# Patient Record
Sex: Female | Born: 1955 | Race: White | Hispanic: No
Health system: Midwestern US, Community
[De-identification: ages and names within clinical notes are randomized; demographics above are authoritative.]

## PROBLEM LIST (undated history)

## (undated) DIAGNOSIS — R079 Chest pain, unspecified: Secondary | ICD-10-CM

## (undated) DIAGNOSIS — R1032 Left lower quadrant pain: Secondary | ICD-10-CM

## (undated) DIAGNOSIS — R7989 Other specified abnormal findings of blood chemistry: Secondary | ICD-10-CM

## (undated) DIAGNOSIS — M5136 Other intervertebral disc degeneration, lumbar region: Secondary | ICD-10-CM

## (undated) DIAGNOSIS — M25552 Pain in left hip: Secondary | ICD-10-CM

## (undated) DIAGNOSIS — R3121 Asymptomatic microscopic hematuria: Secondary | ICD-10-CM

## (undated) DIAGNOSIS — Z1231 Encounter for screening mammogram for malignant neoplasm of breast: Secondary | ICD-10-CM

## (undated) DIAGNOSIS — E1142 Type 2 diabetes mellitus with diabetic polyneuropathy: Secondary | ICD-10-CM

## (undated) DIAGNOSIS — R1030 Lower abdominal pain, unspecified: Secondary | ICD-10-CM

## (undated) DIAGNOSIS — Z1239 Encounter for other screening for malignant neoplasm of breast: Secondary | ICD-10-CM

## (undated) DIAGNOSIS — R198 Other specified symptoms and signs involving the digestive system and abdomen: Secondary | ICD-10-CM

## (undated) DIAGNOSIS — M25559 Pain in unspecified hip: Secondary | ICD-10-CM

## (undated) DIAGNOSIS — F068 Other specified mental disorders due to known physiological condition: Secondary | ICD-10-CM

## (undated) DIAGNOSIS — R222 Localized swelling, mass and lump, trunk: Secondary | ICD-10-CM

## (undated) DIAGNOSIS — B3781 Candidal esophagitis: Secondary | ICD-10-CM

## (undated) DIAGNOSIS — R600 Localized edema: Secondary | ICD-10-CM

## (undated) DIAGNOSIS — D171 Benign lipomatous neoplasm of skin and subcutaneous tissue of trunk: Secondary | ICD-10-CM

## (undated) DIAGNOSIS — M329 Systemic lupus erythematosus, unspecified: Secondary | ICD-10-CM

## (undated) DIAGNOSIS — M6281 Muscle weakness (generalized): Secondary | ICD-10-CM

## (undated) DIAGNOSIS — R109 Unspecified abdominal pain: Secondary | ICD-10-CM

## (undated) DIAGNOSIS — M810 Age-related osteoporosis without current pathological fracture: Secondary | ICD-10-CM

## (undated) DIAGNOSIS — D125 Benign neoplasm of sigmoid colon: Secondary | ICD-10-CM

## (undated) DIAGNOSIS — R0602 Shortness of breath: Secondary | ICD-10-CM

## (undated) DIAGNOSIS — M2392 Unspecified internal derangement of left knee: Secondary | ICD-10-CM

## (undated) DIAGNOSIS — G250 Essential tremor: Secondary | ICD-10-CM

## (undated) DIAGNOSIS — M25519 Pain in unspecified shoulder: Secondary | ICD-10-CM

## (undated) DIAGNOSIS — R059 Cough, unspecified: Secondary | ICD-10-CM

## (undated) DIAGNOSIS — Z780 Asymptomatic menopausal state: Secondary | ICD-10-CM

## (undated) DIAGNOSIS — Z87891 Personal history of nicotine dependence: Secondary | ICD-10-CM

## (undated) DIAGNOSIS — M4722 Other spondylosis with radiculopathy, cervical region: Secondary | ICD-10-CM

## (undated) DIAGNOSIS — M79604 Pain in right leg: Secondary | ICD-10-CM

## (undated) DIAGNOSIS — G43909 Migraine, unspecified, not intractable, without status migrainosus: Secondary | ICD-10-CM

## (undated) DIAGNOSIS — R9389 Abnormal findings on diagnostic imaging of other specified body structures: Secondary | ICD-10-CM

## (undated) DIAGNOSIS — R42 Dizziness and giddiness: Secondary | ICD-10-CM

## (undated) DIAGNOSIS — G43009 Migraine without aura, not intractable, without status migrainosus: Secondary | ICD-10-CM

## (undated) DIAGNOSIS — M545 Low back pain, unspecified: Secondary | ICD-10-CM

## (undated) DIAGNOSIS — H8109 Meniere's disease, unspecified ear: Secondary | ICD-10-CM

## (undated) DIAGNOSIS — K529 Noninfective gastroenteritis and colitis, unspecified: Secondary | ICD-10-CM

## (undated) DIAGNOSIS — M51369 Other intervertebral disc degeneration, lumbar region without mention of lumbar back pain or lower extremity pain: Secondary | ICD-10-CM

## (undated) DIAGNOSIS — F1190 Opioid use, unspecified, uncomplicated: Secondary | ICD-10-CM

## (undated) DIAGNOSIS — M5412 Radiculopathy, cervical region: Principal | ICD-10-CM

## (undated) DIAGNOSIS — R634 Abnormal weight loss: Secondary | ICD-10-CM

## (undated) DIAGNOSIS — R55 Syncope and collapse: Secondary | ICD-10-CM

## (undated) DIAGNOSIS — M5416 Radiculopathy, lumbar region: Principal | ICD-10-CM

## (undated) DIAGNOSIS — M7989 Other specified soft tissue disorders: Secondary | ICD-10-CM

## (undated) DIAGNOSIS — M48062 Spinal stenosis, lumbar region with neurogenic claudication: Secondary | ICD-10-CM

## (undated) DIAGNOSIS — D485 Neoplasm of uncertain behavior of skin: Secondary | ICD-10-CM

## (undated) HISTORY — PX: BACK SURGERY: SHX140

## (undated) HISTORY — PX: COLON RESECTION: SHX5231

## (undated) HISTORY — PX: ABDOMINAL HYSTERECTOMY: SHX81

## (undated) HISTORY — PX: EYE SURGERY: SHX253

---

## 2008-10-07 LAB — METABOLIC PANEL, COMPREHENSIVE
A-G Ratio: 0.8 — ABNORMAL LOW (ref 1.1–2.2)
A-G Ratio: 0.9 — ABNORMAL LOW (ref 1.1–2.2)
ALT (SGPT): 106 U/L — ABNORMAL HIGH (ref 30–65)
ALT (SGPT): 160 U/L — ABNORMAL HIGH (ref 30–65)
AST (SGOT): 158 U/L — ABNORMAL HIGH (ref 15–37)
AST (SGOT): 99 U/L — ABNORMAL HIGH (ref 15–37)
Albumin: 2.6 g/dL — ABNORMAL LOW (ref 3.5–5.0)
Albumin: 3.6 g/dL (ref 3.5–5.0)
Alk. phosphatase: 114 U/L (ref 50–136)
Alk. phosphatase: 170 U/L — ABNORMAL HIGH (ref 50–136)
Anion gap: 6 mmol/L (ref 5–15)
Anion gap: 9 mmol/L (ref 5–15)
BUN/Creatinine ratio: 23 — ABNORMAL HIGH (ref 12–20)
BUN/Creatinine ratio: 27 — ABNORMAL HIGH (ref 12–20)
BUN: 18 MG/DL (ref 6–20)
BUN: 19 MG/DL (ref 6–20)
Bilirubin, total: 0.3 MG/DL (ref 0.2–1.0)
Bilirubin, total: 0.5 MG/DL (ref 0.2–1.0)
CO2: 27 MMOL/L (ref 21–32)
CO2: 27 MMOL/L (ref 21–32)
Calcium: 7.2 MG/DL — ABNORMAL LOW (ref 8.5–10.1)
Calcium: 8.6 MG/DL (ref 8.5–10.1)
Chloride: 103 MMOL/L (ref 97–108)
Chloride: 110 MMOL/L — ABNORMAL HIGH (ref 97–108)
Creatinine: 0.7 MG/DL (ref 0.6–1.3)
Creatinine: 0.8 MG/DL (ref 0.6–1.3)
GFR est AA: >60 mL/min/{1.73_m2} (ref >60)
GFR est AA: >60 mL/min/{1.73_m2} (ref >60)
GFR est non-AA: >60 mL/min/{1.73_m2} (ref >60)
GFR est non-AA: >60 mL/min/{1.73_m2} (ref >60)
Globulin: 3.1 g/dL (ref 2.0–4.0)
Globulin: 4.1 g/dL — ABNORMAL HIGH (ref 2.0–4.0)
Glucose: 120 MG/DL — ABNORMAL HIGH (ref 50–100)
Glucose: 85 MG/DL (ref 50–100)
Potassium: 3 MMOL/L — ABNORMAL LOW (ref 3.5–5.1)
Potassium: 3.1 MMOL/L — ABNORMAL LOW (ref 3.5–5.1)
Protein, total: 5.7 g/dL — ABNORMAL LOW (ref 6.4–8.2)
Protein, total: 7.7 g/dL (ref 6.4–8.2)
Sodium: 139 MMOL/L (ref 136–145)
Sodium: 143 MMOL/L (ref 136–145)

## 2008-10-07 LAB — URINALYSIS W/ REFLEX CULTURE
Blood: NEGATIVE
Glucose: NEGATIVE MG/DL
Ketone: 15 MG/DL — AB
Leukocyte Esterase: NEGATIVE
Nitrites: NEGATIVE
Specific gravity: >1.03 — ABNORMAL HIGH (ref 1.003–1.030)
Urobilinogen: 1 EU/DL (ref 0.2–1.0)
pH (UA): 5.5 (ref 5.0–8.0)

## 2008-10-07 LAB — CBC WITH AUTOMATED DIFF
ABS. BASOPHILS: 0 10*3/uL (ref 0.0–0.1)
ABS. EOSINOPHILS: 0 10*3/uL (ref 0.0–0.4)
ABS. LYMPHOCYTES: 0.6 10*3/uL — ABNORMAL LOW (ref 0.8–3.5)
ABS. MONOCYTES: 0.2 10*3/uL (ref 0.0–1.0)
ABS. NEUTROPHILS: 6.7 10*3/uL (ref 1.8–8.0)
BASOPHILS: 0 % (ref 0–1)
EOSINOPHILS: 0 % (ref 0–7)
HCT: 43.6 % (ref 35.0–47.0)
HGB: 15.4 g/dL (ref 11.5–16.0)
LYMPHOCYTES: 8 % — ABNORMAL LOW (ref 12–49)
MCH: 31.8 PG (ref 26.0–34.0)
MCHC: 35.3 g/dL (ref 30.0–36.5)
MCV: 90.1 FL (ref 80.0–99.0)
MONOCYTES: 3 % — ABNORMAL LOW (ref 5–13)
NEUTROPHILS: 89 % — ABNORMAL HIGH (ref 32–75)
PLATELET: 268 10*3/uL (ref 150–400)
RBC: 4.84 M/uL (ref 3.80–5.20)
RDW: 13.1 % (ref 11.5–14.5)
WBC: 7.5 10*3/uL (ref 3.6–11.0)

## 2008-10-07 LAB — CBC W/O DIFF
HCT: 35.1 % (ref 35.0–47.0)
HGB: 11.9 g/dL (ref 11.5–16.0)
MCH: 31.2 PG (ref 26.0–34.0)
MCHC: 33.9 g/dL (ref 30.0–36.5)
MCV: 92.1 FL (ref 80.0–99.0)
PLATELET: 207 10*3/uL (ref 150–400)
RBC: 3.81 M/uL (ref 3.80–5.20)
RDW: 13.5 % (ref 11.5–14.5)
WBC: 3.5 10*3/uL — ABNORMAL LOW (ref 3.6–11.0)

## 2008-10-07 LAB — BILIRUBIN, CONFIRM: Bilirubin UA, confirm: NEGATIVE

## 2008-10-07 LAB — HEP B SURFACE AB
Hep B surface Ab Interp.: NEGATIVE
Hepatitis B surface Ab: 3.21 m[IU]/mL

## 2008-10-07 LAB — WBC, STOOL: White blood cells, stool: 0 /HPF (ref 0–4)

## 2008-10-07 LAB — LIPASE: Lipase: 167 U/L (ref 114–286)

## 2008-10-07 LAB — AMYLASE: Amylase: 28 U/L (ref 25–115)

## 2008-10-07 LAB — C DIFFICILE TOXIN A & B BY EIA: C. difficile toxin A&B: NEGATIVE

## 2008-10-07 LAB — MAGNESIUM: Magnesium: 1.5 MG/DL — ABNORMAL LOW (ref 1.6–2.4)

## 2008-10-07 LAB — HEPATITIS B SURFACE ANTIBODY
HEP B SURF AB: 3.21 m[IU]/mL
Hep B S Ab: NEGATIVE

## 2008-10-07 NOTE — H&P (Signed)
Name: GUDRUN, AXE Admitted: 10/07/2008  MR #: [id number] DOB: 1956-03-28  Account #: [id number] Age: 53  Physician: Raniah Karan K. Havery Moros, M.D. Location: [id number]     HISTORY PHYSICAL      PRIMARY CARE PHYSICIAN: Andy Gauss, NP    REFERRING PHYSICIAN: Ezequiel Essex, MD    PRESENTING COMPLAINT: Nausea, vomiting, and diarrhea for 2 days.    HISTORY OF PRESENTING COMPLAINT: A 53 year old Caucasian female with a  past medical history of fibromyalgia. She comes in with a complaint of  nausea which started about 48 hours before presentation to the ER. She had  nausea, which progressively turned into vomiting. She had several bouts of  vomiting, then she developed diarrhea. She has had multiple bouts of  diarrhea. This is now associated with colicky abdominal pain and she has  pain in her left lower quadrant. She reports that there has been a stomach  bug which has been going through her grandchildren. For the last 3 weeks,  at least one member of the family has been sick every week. She had  several grandchildren who were sick about 3 weeks ago, then her son was  sick last week, and this week about 4 days prior to this presentation, one  of her grandchildren also had diarrhea, nausea, and vomiting, and was  staying with her in the evenings after school. She lives with her husband  and her husband is not sick. She does not remember eating anything out of  the ordinary, and she feels very weak and tired.    PAST MEDICAL HISTORY:  1. Fibromyalgia.  2. Depression.  3. Herpes zoster which involved the right eye. 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 She is on chronic Valtrex therapy.  4. Chronic back pain with a history of 2 back surgeries in 1982 and 1991  for a ruptured disk.  5. Abscess of the colon, status post segmental colectomy in 1994, most  likely in the sigmoid colon.  6. History of cholecystectomy in 2001.   7. History of hysterectomy in 1994.  8. Arthroscopy of the right knee in January 2010.  9. Surgery on her left ear for M ni re disease in October 2009.    ALLERGIES: PENICILLIN and MORPHINE (for the morphine, the pain intensified  after she received it).    OUTPATIENT MEDICATIONS:  1. Hydrochlorothiazide 37.5 mg p.o. daily.  2. Cymbalta 60 mg b.i.d.  3. Zofran p.r.n., started today.  4. Hydrocodone APAP 5/500 mg 1 q.4h. p.r.n.  5. Valtrex 1 g p.o. daily.  6. Premarin 0.9 mg p.o. daily.  7. Flexeril p.r.n.    SOCIAL HISTORY: She is married. She lives with her husband. She has 2  children and 2 or 3 step children. She is currently on Disability. She  used to smoke, but quit. She does not drink any alcohol.    FAMILY HISTORY: She has a mother who has bradycardia. She has a sister  who has an arrhythmia. That sister needed to be shocked at some point.  Both the mother and sister are on beta-blockers.    REVIEW OF SYSTEMS: CONSTITUTIONAL: No fever, chills, or rigors. No  weight loss. She does feel very tired. EYES: She has decreased vision in  her right eye after herpes zoster. 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 Her vision is now stable. Otherwise, no excessive  tearing, and no  redness of the eyes. EARS, NOSE, MOUTH, AND THROAT: She had surgery in  her left ear for M ni re disease in October 2009. She does not have any  ear pain. She does not have any ear discharge. She does not have  excessive sneezing or rhinorrhea. No oral sores. No sore throat.  CARDIOVASCULAR: No orthopnea. No paroxysmal nocturnal dyspnea. No  palpitations. skipped heart beats. No chest pain or chest pressure.  RESPIRATORY: No cough. No shortness of breath. No chest tightness. No  wheezing. GASTROINTESTINAL: Please refer to HPI. GENITOURINARY: No  dysuria or frequency. She may have noticed decreased urinary output. No  hematuria. MUSCULOSKELETAL: She has chronic pain, especially in the back   because of her fibromyalgia and also a history of ruptured disk.  NEUROLOGIC: She has a mild headache at this time. No changes in her  vision. She does not have any facial asymmetry. She does not have any  weakness or numbness of any part of her body. PSYCHIATRIC: She has a  history of depression, and she is on Cymbalta.    PHYSICAL EXAMINATION:  GENERAL CONDITION: A Caucasian female who appears to be uncomfortable.  Does not appear to be toxic.  VITAL SIGNS: Temperature is 98.8. Pulse was initially 132 and is now 98.  Respiratory rate was initially in the 20s, now 18. Blood pressure is  149/64. Pulse oximetry is 97% on room air.  HEENT: Pupils are equal, round, and reactive to light and accommodation.  Extraocular muscle movements are intact. Sclerae are nonicteric. She does  not have pallor of the conjunctivae. No flaring of the ala nasi. Oral  mucosa is moist with no oropharyngeal erythema. No oral sores. No  candidiasis.  NECK: Supple with no thyromegaly. Trachea is midline. No cervical  adenopathy.  CHEST: Chest excursion is symmetrical. She does not have any chest wall  tenderness. There is no use of accessory muscles of respiration. No  subcostal or intercostal retractions. Lungs are clear to auscultation with  no wheezing or rales.  CARDIOVASCULAR: S1, S2 heard and regular. No S3, no S4 appreciated.  ABDOMEN: Obese. No hepatomegaly. No splenomegaly. She has tenderness in  the left lower quadrant with no guarding or rebound. Bowel sounds are  present.  EXTREMITIES: She does not have any clubbing. No cyanosis, no pitting  edema.  INTEGUMENTARY: She does not have any rash. No subcutaneous nodules. No  ecchymosis.  CENTRAL NERVOUS SYSTEM: Alert and oriented x3. Cranial nerves 2-12 are  normal. Deep tendon reflexes are 2+ globally. Gait is not tested.  PSYCHIATRIC: Affect and mood are appropriate.    LABORATORIES: White cell count is 7.5, hemoglobin is 15.4, hematocrit is   43.6, and platelet count is 268. Neutrophils are 89%, lymphocytes are 8%.  Sodium is 139, potassium is 3.1, chloride is 103, CO2 is 27, BUN is 18,  creatinine is 0.8. AST is 158, ALT is 160, alkaline phosphatase is 170.  Albumin is 3.6. Amylase is 28, lipase is 167. Urinalysis: She has 5-10  epithelial cells, so this is a bad specimen.    ASSESSMENT AND PLAN:  1. Acute gastroenteritis in a patient who has been exposed to other people  who have had nausea, vomiting, and diarrhea. Most like oral or fecal route  of transmission, and most likely a viral illness. We shall admit the  patient and hydrate her aggressively. Will send stool studies.  2. Systemic inflammatory response with a pulse rate of 132 and respiratory  rate of 20. The patient's symptoms are improving. This was most likely  secondary to dehydration.  3. Abnormal liver function tests with transaminase elevation, as well as  alkaline phosphatase. The patient is obese. This could be due to fatty  liver, but because of the nausea and vomiting and acute onset, will exclude  hepatitis A infection. I will also do hepatitis B screening.  4. Fibromyalgia. Continue outpatient medications.  5. Hypertension. Will hold oral medications and give her hydralazine as  needed p.r.n.  6. History of herpes zoster of the trigeminal nerve. I am going to keep  the patient on Voltaren.  7. Deep venous thrombosis prophylaxis. The patient will get sequential  TEDs.  8. Code status. The patient is a FULL CODE.    This patient is admitted to the service of Dr. Pecolia Ades. About 65  minutes was spent in the care of this patient.            E-Signed By  Praxair. Havery Moros, M.D. 10/22/2008 06:07    Marvell Tamer K. Havery Moros, M.D.    cc: Marionette Meskill K. Havery Moros, M.D.        AKM/WMX; D: 10/07/2008 1:19 A; T: 10/07/2008 11:10 A; DOC# [id number]; Job#  [id number]

## 2008-10-08 LAB — CBC W/O DIFF
HCT: 35.6 % (ref 35.0–47.0)
HGB: 11.9 g/dL (ref 11.5–16.0)
MCH: 31.5 PG (ref 26.0–34.0)
MCHC: 33.4 g/dL (ref 30.0–36.5)
MCV: 94.2 FL (ref 80.0–99.0)
PLATELET: 173 10*3/uL (ref 150–400)
RBC: 3.78 M/uL — ABNORMAL LOW (ref 3.80–5.20)
RDW: 13.7 % (ref 11.5–14.5)
WBC: 4.1 10*3/uL (ref 3.6–11.0)

## 2008-10-08 LAB — MAGNESIUM: Magnesium: 1.8 MG/DL (ref 1.6–2.4)

## 2008-10-08 LAB — METABOLIC PANEL, BASIC
Anion gap: 7 mmol/L (ref 5–15)
BUN/Creatinine ratio: 6 — ABNORMAL LOW (ref 12–20)
BUN: 4 MG/DL — ABNORMAL LOW (ref 6–20)
CO2: 25 MMOL/L (ref 21–32)
Calcium: 7.5 MG/DL — ABNORMAL LOW (ref 8.5–10.1)
Chloride: 109 MMOL/L — ABNORMAL HIGH (ref 97–108)
Creatinine: 0.7 MG/DL (ref 0.6–1.3)
GFR est AA: >60 mL/min/{1.73_m2} (ref >60)
GFR est non-AA: >60 mL/min/{1.73_m2} (ref >60)
Glucose: 80 MG/DL (ref 50–100)
Potassium: 3.4 MMOL/L — ABNORMAL LOW (ref 3.5–5.1)
Sodium: 141 MMOL/L (ref 136–145)

## 2008-10-08 LAB — CULTURE, URINE
Colonies Counted: <1000
Colony Count: <1000
Culture result:: NO GROWTH
Culture: NO GROWTH

## 2008-10-08 LAB — CULTURE, STOOL

## 2008-10-08 LAB — HEPATIC FUNCTION PANEL
A-G Ratio: 0.8 — ABNORMAL LOW (ref 1.1–2.2)
ALT (SGPT): 85 U/L — ABNORMAL HIGH (ref 30–65)
AST (SGOT): 82 U/L — ABNORMAL HIGH (ref 15–37)
Albumin: 2.5 g/dL — ABNORMAL LOW (ref 3.5–5.0)
Alk. phosphatase: 113 U/L (ref 50–136)
Bilirubin, direct: 0.1 MG/DL (ref 0.0–0.2)
Bilirubin, total: 0.2 MG/DL (ref 0.2–1.0)
Globulin: 3.1 g/dL (ref 2.0–4.0)
Protein, total: 5.6 g/dL — ABNORMAL LOW (ref 6.4–8.2)

## 2008-10-08 LAB — HEP B SURFACE AG: Hep B surface Ag: NEGATIVE

## 2008-10-09 LAB — CBC W/O DIFF
HCT: 35 % (ref 35.0–47.0)
HGB: 11.6 g/dL (ref 11.5–16.0)
MCH: 30.6 PG (ref 26.0–34.0)
MCHC: 33.1 g/dL (ref 30.0–36.5)
MCV: 92.3 FL (ref 80.0–99.0)
PLATELET: 192 10*3/uL (ref 150–400)
RBC: 3.79 M/uL — ABNORMAL LOW (ref 3.80–5.20)
RDW: 13.4 % (ref 11.5–14.5)
WBC: 4.8 10*3/uL (ref 3.6–11.0)

## 2008-10-09 LAB — METABOLIC PANEL, BASIC
Anion gap: 6 mmol/L (ref 5–15)
BUN/Creatinine ratio: 6 — ABNORMAL LOW (ref 12–20)
BUN: 4 MG/DL — ABNORMAL LOW (ref 6–20)
CO2: 26 MMOL/L (ref 21–32)
Calcium: 7.8 MG/DL — ABNORMAL LOW (ref 8.5–10.1)
Chloride: 109 MMOL/L — ABNORMAL HIGH (ref 97–108)
Creatinine: 0.7 MG/DL (ref 0.6–1.3)
GFR est AA: >60 mL/min/{1.73_m2} (ref >60)
GFR est non-AA: >60 mL/min/{1.73_m2} (ref >60)
Glucose: 100 MG/DL (ref 50–100)
Potassium: 3.4 MMOL/L — ABNORMAL LOW (ref 3.5–5.1)
Sodium: 141 MMOL/L (ref 136–145)

## 2008-10-09 LAB — CMV AB, IGM: CMV Ab, IgM: 0.7 IV

## 2008-10-09 LAB — CMV AB, IGG: CMV Ab, IgG: 7.97 IV

## 2008-10-09 LAB — HEPATITIS A AB, IGM: HEPATITIS A Ab, IgM: NEGATIVE

## 2008-10-09 NOTE — Discharge Summary (Signed)
Name: Katherine [name], Katherine [name] Admitted: 10/07/2008  MR #: [id number] Discharged: 10/09/2008  Account #: [id number] DOB: 03/13/1956  Physician: Mittie Bodo A. Pecolia Ades, M.D. Age 53     DISCHARGE SUMMARY        PRIMARY CARE PHYSICIAN: Dr. Lamar Blinks.    CONSULTANT: Gastroenterology, Daralene Milch, MD.    DISCHARGE DIAGNOSES:  1. Suspected viral gastroenteritis.  2. Elevated liver function test secondary to the viral infection.  3. Hypokalemia.  4. Right-sided headache.  5. Vaginal candidiasis treated as an inpatient.    DISCHARGE MEDICATIONS:  1. Phenergan 25 mg every 8 hours as needed for nausea.  2. Lortab 5/500 mg strength 1 tablet every 4 to 6 hours as needed.  3. Diflucan 150 mg by mouth x1, given in the hospital.    DIAGNOSTIC WORKUP: Include, a CBC that was significant for a white cell  count of 3500 initially, currently 4800. The rest of CBC was normal.  Sodium is 141. Potassium 3.4 currently, it was as low as 3. CO2 26,  chloride 109, BUN 4, creatinine 0.7, and calcium 7.8. LFTs, AST was 82;  ALT was 85; total bilirubin has been normal, and these are declining since  admission. Magnesium level was 1.5 and already replaced. Hepatitis B  surface antibody is negative. Hepatitis A IgM and hepatitis B surface  antigen is negative. CMV IgG and IgM is 10, and she has prior exposure to  CMV, no acute infection. Stool culture was negative for pathogenic  bacteria. Stool Clostridium difficile was negative x1. Radiologic study  includes CT scan of the head without contrast and sinuses without contrast,  which was normal. CT scan of the abdomen and pelvis with contrast done,  which did not reveal any intraabdominal abscesses or any diverticulitis.    HOSPITAL COURSE: Please see H and P and chart for details. 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 She has been on  suppressive Valtrex treatment for the shingles since she had the corneal  transplant. She presents with episode of acute onset of nausea, vomiting,  and diarrhea and reports similar illness in children contact as well as the  parents of the children in the family. The workup to the course of the  diarrhea has been negative so far as mentioned in the diagnostic workup  part, and we are assuming that this is of viral etiology. Her elevated  LFTs on admission are slowly declining as well.    During the course of her stay, she had a severe and persisting right-sided  frontal headache. There was not any redness of the right eye or any  appearance of rash surrounding the right orbital area or frontal side. We  did CT scan of the head and CT of the sinuses, which did not reveal  abnormality, and brief antibiotic that was initiated was discontinued.    Regarding her left lower quadrant abdominal pain and tenderness, it has  still persisted. It is much less in intensity currently. She received a  very brief course of combined antibiotic with Levaquin and Flagyl until the  CT scan was done and ruled out active diverticulitis. Gastroenterology was  asked to see her if any further recommendation or suggestion, and the plan  from GI side is to see her as an outpatient for colonoscopy when this  episode of illness  subsides.    DISCHARGE INSTRUCTIONS: The patient is being discharged in a stable  condition. She is advised to continue to take small but frequent meals as  tolerated and to continue with nausea medication for now. She is to follow  up with Dr. Teressa Lower, Gastroenterology, in 2 to 4 weeks. She is also to  follow up with PCP, Dr. Renato Gails, in 2 to 4 weeks, and the patient is to call  to make those schedules.    This patient's care and coordination time took more than 30 minutes.                E-Signed By  Rahel A. Teklehaimanot, M.D. 10/11/2008  14:13     Rahel A. Teklehaimanot, M.D.    cc: Rahel A. Pecolia Ades, M.D.        RAT/FI2; D: 10/09/2008 2:39 P; T: 10/09/2008 2:51 P; DOC# [id number]; Job#  [id number]

## 2008-10-09 NOTE — Consults (Signed)
Name: Katherine [name], Katherine [name] Admitted: 10/07/2008  MR #: [id number] DOB: 11/18/1955  Account #: [id number] Age: 53  Consultant: Arnoldo Lenis, M.D. Location: [id number]     CONSULTATION REPORT    DATE OF CONSULTATION:  REFERRING PHYSICIAN:      CHIEF COMPLAINT: Left lower quadrant pain.    HISTORY OF PRESENT ILLNESS: This patient is a 53 year old, lady who I was  asked to see in consultation by Dr. Pecolia Ades, for evaluation of left  lower quadrant pain. She was in her usual state of health until 3 days ago  when she had the onset of diarrhea and some mild abdominal discomfort. The  vomiting and diarrhea has resolved and is no longer present but, today, she  has been having quite a bit of left lower quadrant pain. She has a previous  history of colon resection for diverticulitis with abscess a number of  years ago. She also has a history of colon polyps with last colonoscopy by  Dr. Teressa Lower a year or 2 ago. She takes 2 Aleve daily for arthritic pains  related to lupus.    REVIEW OF SYSTEMS: CARDIOVASCULAR: No exertional-related chest pain. No  orthopnea, PND, or edema. RESPIRATORY: No cough, sputum, or shortness of  breath. GENITOURINARY: No dysuria. NEUROMUSCULAR: She has arthritis for  which she takes Aleve, hydrocodone, and Plaquenil. CONSTITUTIONAL: No  fever, chills, or weight loss. SKIN: No rashes. HEMATOLOGIC: No bruising  or bleeding. ENDOCRINE: No diabetes or thyroid disease. NEUROLOGIC: No  headaches or syncope. HEENT: No visual complaints.    PAST MEDICAL HISTORY: Colon resection, colon polyps, shingles, TAH/BSO.    MEDICATIONS:  1. Premarin.  2. Hydrochlorothiazide.  3. Valtrex.  4. Hydrocodone.  5. Plaquenil.  6. Flexeril.  7. Cymbalta.  8. Zofran.    ALLERGIES: PENICILLIN and MORPHINE.    SOCIAL HISTORY: No cigarettes or alcohol.    FAMILY HISTORY: No colon cancer or polyps.    PHYSICAL EXAMINATION:  GENERAL: A well-developed, well-nourished lady lying supine in no acute  distress.   VITAL SIGNS: Temperature 97.6, pulse 81 and regular, respirations 18,  blood pressure 127/81.  HEENT: Sclerae clear. Conjunctivae pink.  NECK: No neck vein distention.  LUNGS: Clear.  HEART: Regular sinus rhythm. No murmur or gallop.  ABDOMEN: Flat, soft. Tender in the left lower quadrant. No guarding. No  organomegaly. Normal bowel sounds. No masses. No rebound.  EXTREMITIES: No edema.    LABORATORY DATA: Hemoglobin 15.4, WBC 7500, platelets 268,000. Potassium  3.1, BUN 18, creatinine 0.8, bilirubin 0.5, alkaline phosphatase 170. SGOT  158, SGPT 160. Amylase 28, lipase 167.    CT scan of the abdomen and pelvis is normal except for small bilateral  adrenal nodules, possibly adenomas.    IMPRESSION:  1. Left lower quadrant pain.  2. Abnormal liver tests. Rule out medication related. Rule out autoimmune  hepatitis with lupus.    PLAN:  1. She will warrant followup colonoscopy by Dr. Teressa Lower. His office had  already sent her a letter in December recommending the procedure.  2. I will defer further evaluation of the liver to the Dr. Teressa Lower.    DISCUSSION: The elevated liver enzymes certainly could be autoimmune in  nature since the patient has lupus and is followed by Dr. Lucien Mons. I do  not have previous records to see if this has been evaluated in the past.  She does have a history of diverticulitis requiring surgery, but there was  no suggestion of diverticulitis on her current CT scan,  and her WBC is  normal and she is afebrile. I will defer the timing of colonoscopy to Dr.  Teressa Lower.      E-Signed By  Arnoldo Lenis, M.D. 10/12/2008 17:39    Arnoldo Lenis, M.D.    cc: Arnoldo Lenis, M.D.   Francoise Schaumann. Lucien Mons, M.D.   Daralene Milch, M.D.    Katherine RIED, NP    HDK/WMX; D: 10/08/2008 4:34 P; T: 10/09/2008 9:02 A; DOC# [id number]; Job#  [id number]

## 2008-10-10 LAB — OVA & PARASITES, STOOL
O&P, Trichrome stain: NEGATIVE
Ova & Parasite exam: NEGATIVE

## 2009-08-28 LAB — CBC WITH AUTOMATED DIFF
ABS. BASOPHILS: 0 10*3/uL (ref 0.0–0.1)
ABS. EOSINOPHILS: 0 10*3/uL (ref 0.0–0.4)
ABS. LYMPHOCYTES: 1.7 10*3/uL (ref 0.8–3.5)
ABS. MONOCYTES: 0.3 10*3/uL (ref 0.0–1.0)
ABS. NEUTROPHILS: 3.8 10*3/uL (ref 1.8–8.0)
BASOPHILS: 0 % (ref 0–1)
EOSINOPHILS: 1 % (ref 0–7)
HCT: 42.8 % (ref 35.0–47.0)
HGB: 14.9 g/dL (ref 11.5–16.0)
LYMPHOCYTES: 29 % (ref 12–49)
MCH: 31.6 PG (ref 26.0–34.0)
MCHC: 34.8 g/dL (ref 30.0–36.5)
MCV: 90.9 FL (ref 80.0–99.0)
MONOCYTES: 5 % (ref 5–13)
NEUTROPHILS: 65 % (ref 32–75)
PLATELET: 263 10*3/uL (ref 150–400)
RBC: 4.71 M/uL (ref 3.80–5.20)
RDW: 13.9 % (ref 11.5–14.5)
WBC: 5.8 10*3/uL (ref 3.6–11.0)

## 2009-08-28 LAB — URINALYSIS W/MICROSCOPIC
Bacteria: NEGATIVE /HPF
Bilirubin: NEGATIVE
Blood: NEGATIVE
Glucose: NEGATIVE MG/DL
Ketone: NEGATIVE MG/DL
Nitrites: NEGATIVE
Protein: NEGATIVE MG/DL
Specific gravity: 1.008 (ref 1.003–1.030)
Urobilinogen: 0.2 EU/DL (ref 0.2–1.0)
pH (UA): 7 (ref 5.0–8.0)

## 2009-08-28 LAB — METABOLIC PANEL, COMPREHENSIVE
A-G Ratio: 1.1 (ref 1.1–2.2)
ALT (SGPT): 40 U/L (ref 12–78)
AST (SGOT): 38 U/L — ABNORMAL HIGH (ref 15–37)
Albumin: 4.2 g/dL (ref 3.5–5.0)
Alk. phosphatase: 112 U/L (ref 50–136)
Anion gap: 10 mmol/L (ref 5–15)
BUN/Creatinine ratio: 11 — ABNORMAL LOW (ref 12–20)
BUN: 10 MG/DL (ref 6–20)
Bilirubin, total: 0.3 MG/DL (ref 0.2–1.0)
CO2: 28 MMOL/L (ref 21–32)
Calcium: 9.8 MG/DL (ref 8.5–10.1)
Chloride: 101 MMOL/L (ref 97–108)
Creatinine: 0.9 MG/DL (ref 0.6–1.3)
GFR est AA: >60 mL/min/{1.73_m2} (ref >60)
GFR est non-AA: >60 mL/min/{1.73_m2} (ref >60)
Globulin: 3.9 g/dL (ref 2.0–4.0)
Glucose: 99 MG/DL (ref 65–100)
Potassium: 3 MMOL/L — ABNORMAL LOW (ref 3.5–5.1)
Protein, total: 8.1 g/dL (ref 6.4–8.2)
Sodium: 139 MMOL/L (ref 136–145)

## 2009-08-28 MED ORDER — PROMETHAZINE 25 MG TAB
25 mg | ORAL_TABLET | Freq: Four times a day (QID) | ORAL | Status: AC | PRN
Start: 2009-08-28 — End: 2009-09-04

## 2009-08-28 MED ADMIN — potassium chloride SR (KLOR-CON 10) tablet 20 mEq: ORAL | @ 21:00:00 | NDC 00074780419

## 2009-08-28 MED ADMIN — ondansetron (ZOFRAN) injection 4 mg: INTRAVENOUS | @ 22:00:00 | NDC 00143989105

## 2009-08-28 MED ADMIN — diazepam (VALIUM) injection 5 mg: INTRAVENOUS | @ 19:00:00 | NDC 00409127332

## 2009-08-28 MED ADMIN — prochlorperazine (COMPAZINE) injection 10 mg: INTRAVENOUS | @ 19:00:00 | NDC 55390007710

## 2009-08-28 MED ADMIN — diphenhydrAMINE (BENADRYL) injection 25 mg: INTRAVENOUS | @ 19:00:00 | NDC 00641037621

## 2009-08-28 MED ADMIN — ketorolac (TORADOL) injection 15 mg: INTRAVENOUS | @ 19:00:00 | NDC 00409379501

## 2009-08-28 MED ADMIN — dextrose 5% - 0.45% NaCl with KCl 20 mEq/L infusion: INTRAVENOUS | @ 19:00:00 | NDC 00409790209

## 2009-08-28 MED FILL — DIAZEPAM 5 MG/ML SYRINGE: 5 mg/mL | INTRAMUSCULAR | Qty: 2

## 2009-08-28 MED FILL — ONDANSETRON (PF) 4 MG/2 ML INJECTION: 4 mg/2 mL | INTRAMUSCULAR | Qty: 2

## 2009-08-28 MED FILL — D5-1/2 NS & POTASSIUM CHLORIDE 20 MEQ/L IV: 20 mEq/L | INTRAVENOUS | Qty: 1000

## 2009-08-28 MED FILL — SALINE FLUSH INJECTION SYRINGE: INTRAMUSCULAR | Qty: 60

## 2009-08-28 MED FILL — KETOROLAC TROMETHAMINE 30 MG/ML INJECTION: 30 mg/mL (1 mL) | INTRAMUSCULAR | Qty: 1

## 2009-08-28 MED FILL — K-TAB 10 MEQ TABLET,EXTENDED RELEASE: 10 mEq | ORAL | Qty: 2

## 2009-08-28 MED FILL — PROCHLORPERAZINE EDISYLATE 5 MG/ML INJECTION: 5 mg/mL | INTRAMUSCULAR | Qty: 2

## 2009-08-28 MED FILL — DIPHENHYDRAMINE HCL 50 MG/ML IJ SOLN: 50 mg/mL | INTRAMUSCULAR | Qty: 1

## 2009-08-28 NOTE — ED Notes (Signed)
Pt states her pain is better at a 5/10.  Family at the bedside.

## 2009-08-28 NOTE — ED Notes (Signed)
Pt states her pain is a 6/10 at this time.

## 2009-08-28 NOTE — ED Notes (Signed)
Pt states her pain is a 5/10 at this time.  Family at the bedside.

## 2009-08-28 NOTE — ED Notes (Signed)
Pt was assisted to the [facility].  Family at the bedside at this time.  Pt states pain in her RUQ that she states started today.  Pt states HA of 10/10 at this time.

## 2009-08-28 NOTE — ED Notes (Signed)
Pt is nausea at this time.  Pt was medicated.

## 2009-08-28 NOTE — ED Notes (Signed)
Pt's sister stated outside of room that pt lost her husband in October and that his birthday was [birth year].  She stated that she is concerned that some of these symptoms may be related to stress.  She stated that she wanted to mention this outside of pt's room so that pt would not have to tell staff.  Pt lying on stretcher in a darkened room at pt request.  Pt wearing sunglasses.  Pt was sent in from PCP's office.

## 2009-08-28 NOTE — ED Notes (Signed)
Pt is on the BSC at this time.  Family at the bedside.

## 2009-08-28 NOTE — ED Notes (Signed)
Pt states her pain is 4/10.

## 2009-08-28 NOTE — ED Notes (Signed)
Pt states her pain is a 6/10 at this time.  Pt was medicated at this time.  Family at the bedside.

## 2009-08-28 NOTE — ED Provider Notes (Signed)
HPI Comments: Katherine [name] is a 54 y.o. WF with PMH of Meniere disease who presents via EMS to ED with C/O dizziness x 3 days.  Pt reports onset of dizziness and HA with associated nausea 3 days ago. Pt describes dizziness as "spinning" sensation and states sxs are similar of her vertigo flares. Pt reports RUQ abd pain that began this AM. Pt denies diarrhea, visual changes, or F/C. Pt reports having shingles to L forehead, which have been there for "years".     PCP: Satira Mccallum NP  PSH: hysterectomy, colon resection, appendectomy, cholecystectomy, bladder repair  PMH: diverticulitis, lupus, fibromyalgia    Pt reports no further complaints at this time.  Written by Erline Levine, ED Scribe, as dictated by Estrella Deeds, MD.     The history is provided by the patient.        Past Medical History   Diagnosis Date   ??? Other ill-defined conditions      shingles, lupus, menieres, Fibromyalgia        Past Surgical History   Procedure Date   ??? Hx gyn      hysterectomy   ??? Hx orthopaedic      back x2, right knee   ??? Hx urological      bladder surgery   ??? Hx transplant      right corona   ??? Hx heent      right eye corona transplant, both ears, tonsillectomy   ??? Hx cholecystectomy    ??? Hx appendectomy    ??? Abdomen surgery proc unlisted      1 foot of colon removed           No family history on file.     History   Social History   ??? Marital Status: Married     Spouse Name: N/A     Number of Children: N/A   ??? Years of Education: N/A   Occupational History   ??? Not on file.   Social History Main Topics   ??? Smoking status: Never Smoker    ??? Smokeless tobacco: Not on file   ??? Alcohol Use: No   ??? Drug Use:    ??? Sexually Active:    Other Topics Concern   ??? Not on file   Social History Narrative   ??? No narrative on file           ALLERGIES: Pcn, Morphine and Other      Review of Systems   Constitutional: Negative.  Negative for fever and chills.    Gastrointestinal: Positive for nausea and abdominal pain. Negative for vomiting and diarrhea.        See HPI   Neurological: Positive for dizziness and headaches.        See HPI   All other systems reviewed and are negative.        Filed Vitals:    08/28/2009  2:02 PM   BP: 117/68   Pulse: 96   Temp: 98.4 ??F (36.9 ??C)   Resp: 20   Height: 5\' 7"  (1.702 m)   Weight: 212 lb (96.163 kg)   SpO2: 100%              Physical Exam   GEN: NAD  HENT: moist oral mucosa, no erythema or exudate of pharynx, no erythema or edema of nasal mucosa  Eyes: horizontal nystagmus, PERRL, EOMI  NECK: no tenderness, painless ROM, no lymphadenopathy  CARD: RRR, no murmur, rub or  gallop  LUNG: clear to ausculation bilaterally without wheezes, rhonchi or crackles  ABD: soft, non-tender, non-distended, no rebound or guarding, bowel sounds WNL  EXT: full and painless ROM, no swelling or deformity noted  SKIN: no rash noted  NEURO: normal strength to BUE/LE, CN II-XII grossly intact exception of hearing loss, alert and nonfocal  Written by Erline Levine, ED Scribe, as dictated by Estrella Deeds, MD.     MDM Coding   Reviewed: nursing note, vitals and previous chart        Procedures    EKG interpretation: (Preliminary)  Rhythm: normal sinus rhythm; and regular . Rate (approx.): 99; Axis: normal; P wave: normal; QRS interval: normal ; ST/T wave: NSST changes    5:13 PM  Patient's results have been reviewed with them.  Patient and/or family have verbally conveyed their understanding and agreement of the patient's signs, symptoms, diagnosis, treatment and prognosis and additionally agree to follow up as recommended or return to the Emergency Room should their condition change prior to follow-up.  Discharge instructions have also been provided to the patient with some educational information regarding their diagnosis as well a list of reasons why they would want to return to the ER prior to their follow-up appointment should their condition change.   Written by Erline Levine, ED Scribe, as dictated by Estrella Deeds, MD.

## 2009-08-29 LAB — EKG, 12 LEAD, INITIAL
Atrial Rate: 99 {beats}/min
Calculated P Axis: 54 degrees
Calculated R Axis: 29 degrees
Calculated T Axis: 40 degrees
Diagnosis: NORMAL
P-R Interval: 170 ms
Q-T Interval: 382 ms
QRS Duration: 94 ms
QTC Calculation (Bezet): 490 ms
Ventricular Rate: 99 {beats}/min

## 2009-09-03 ENCOUNTER — Encounter

## 2009-12-25 LAB — METABOLIC PANEL, COMPREHENSIVE
A-G Ratio: 1.1 (ref 1.1–2.2)
ALT (SGPT): 41 U/L (ref 12–78)
AST (SGOT): 46 U/L — ABNORMAL HIGH (ref 15–37)
Albumin: 4 g/dL (ref 3.5–5.0)
Alk. phosphatase: 110 U/L (ref 50–136)
Anion gap: 11 mmol/L (ref 5–15)
BUN/Creatinine ratio: 13 (ref 12–20)
BUN: 12 MG/DL (ref 6–20)
Bilirubin, total: 0.4 MG/DL (ref 0.2–1.0)
CO2: 28 MMOL/L (ref 21–32)
Calcium: 9.6 MG/DL (ref 8.5–10.1)
Chloride: 94 MMOL/L — ABNORMAL LOW (ref 97–108)
Creatinine: 0.9 MG/DL (ref 0.6–1.3)
GFR est AA: >60 mL/min/{1.73_m2} (ref >60)
GFR est non-AA: >60 mL/min/{1.73_m2} (ref >60)
Globulin: 3.8 g/dL (ref 2.0–4.0)
Glucose: 97 MG/DL (ref 65–100)
Potassium: 3.1 MMOL/L — ABNORMAL LOW (ref 3.5–5.1)
Protein, total: 7.8 g/dL (ref 6.4–8.2)
Sodium: 133 MMOL/L — ABNORMAL LOW (ref 136–145)

## 2009-12-25 LAB — CBC WITH AUTOMATED DIFF
ABS. BASOPHILS: 0 10*3/uL (ref 0.0–0.1)
ABS. EOSINOPHILS: 0.1 10*3/uL (ref 0.0–0.4)
ABS. LYMPHOCYTES: 2.1 10*3/uL (ref 0.8–3.5)
ABS. MONOCYTES: 0.3 10*3/uL (ref 0.0–1.0)
ABS. NEUTROPHILS: 3.2 10*3/uL (ref 1.8–8.0)
BASOPHILS: 0 % (ref 0–1)
EOSINOPHILS: 2 % (ref 0–7)
HCT: 40.6 % (ref 35.0–47.0)
HGB: 14.7 g/dL (ref 11.5–16.0)
LYMPHOCYTES: 36 % (ref 12–49)
MCH: 32.5 PG (ref 26.0–34.0)
MCHC: 36.2 g/dL (ref 30.0–36.5)
MCV: 89.8 FL (ref 80.0–99.0)
MONOCYTES: 5 % (ref 5–13)
NEUTROPHILS: 57 % (ref 32–75)
PLATELET: 243 10*3/uL (ref 150–400)
RBC: 4.27 M/uL (ref 3.80–5.20)
RDW: 13 % (ref 11.5–14.5)
WBC: 5.8 10*3/uL (ref 3.6–11.0)

## 2009-12-25 MED ORDER — PROCHLORPERAZINE EDISYLATE 5 MG/ML INJECTION
5 mg/mL | INTRAMUSCULAR | Status: AC
Start: 2009-12-25 — End: 2009-12-25
  Administered 2009-12-25: 18:00:00 via INTRAVENOUS

## 2009-12-25 MED ORDER — HYDROMORPHONE (PF) 1 MG/ML IJ SOLN
1 mg/mL | INTRAMUSCULAR | Status: AC
Start: 2009-12-25 — End: 2009-12-25
  Administered 2009-12-25: 18:00:00 via INTRAVENOUS

## 2009-12-25 MED ORDER — KETOROLAC TROMETHAMINE 30 MG/ML INJECTION
30 mg/mL (1 mL) | INTRAMUSCULAR | Status: AC
Start: 2009-12-25 — End: 2009-12-25
  Administered 2009-12-25: 18:00:00 via INTRAVENOUS

## 2009-12-25 MED ORDER — POTASSIUM CHLORIDE SR 10 MEQ TAB
10 mEq | ORAL_TABLET | Freq: Every day | ORAL | Status: DC
Start: 2009-12-25 — End: 2017-04-20

## 2009-12-25 MED ORDER — HYDROMORPHONE (PF) 1 MG/ML IJ SOLN
1 mg/mL | INTRAMUSCULAR | Status: AC
Start: 2009-12-25 — End: 2009-12-25
  Administered 2009-12-25: 20:00:00 via INTRAVENOUS

## 2009-12-25 MED ORDER — BUTALBITAL-ACETAMINOPHEN-CAFFEINE 50 MG-325 MG-40 MG TAB
50-325-40 mg | ORAL_TABLET | ORAL | Status: DC | PRN
Start: 2009-12-25 — End: 2014-01-09

## 2009-12-25 MED ORDER — POTASSIUM CHLORIDE SR 10 MEQ TAB
10 mEq | ORAL | Status: AC
Start: 2009-12-25 — End: 2009-12-25
  Administered 2009-12-25: 19:00:00 via ORAL

## 2009-12-25 MED ORDER — PROCHLORPERAZINE MALEATE 10 MG TAB
10 mg | ORAL_TABLET | Freq: Three times a day (TID) | ORAL | Status: DC | PRN
Start: 2009-12-25 — End: 2017-03-04

## 2009-12-25 MED FILL — PROCHLORPERAZINE EDISYLATE 5 MG/ML INJECTION: 5 mg/mL | INTRAMUSCULAR | Qty: 2

## 2009-12-25 MED FILL — SALINE FLUSH INJECTION SYRINGE: INTRAMUSCULAR | Qty: 10

## 2009-12-25 MED FILL — HYDROMORPHONE (PF) 1 MG/ML IJ SOLN: 1 mg/mL | INTRAMUSCULAR | Qty: 1

## 2009-12-25 MED FILL — KETOROLAC TROMETHAMINE 30 MG/ML INJECTION: 30 mg/mL (1 mL) | INTRAMUSCULAR | Qty: 1

## 2009-12-25 MED FILL — K-TAB 10 MEQ TABLET,EXTENDED RELEASE: 10 mEq | ORAL | Qty: 4

## 2009-12-25 NOTE — ED Notes (Signed)
Pt resting on right side.  Mother and neighbors remain at bedside.  Pt reports she walked to BR with assistance and some dizziness but tolerated well.  Pt speaks slowly, but answers questions appropriately when not interrupted by family.  Ice pack refilled and placed at right side of head per pt request.  Lights out per pt/family request.

## 2009-12-25 NOTE — ED Notes (Signed)
IV flushed with D5W, pt tolerated well.

## 2009-12-25 NOTE — ED Provider Notes (Signed)
The history is provided by the patient, a friend and a relative. No language interpreter was used.      Katherine [name] is a 54 y.o. 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  Pt notes right sided headache, photophobia, nausea, generalized neck pain and numbness/tingling in BUE.  Pt notes taking Toradol, Diazepam, Fioracet (all pills) without relief of sx.  Per friend, pt went to PCP yesterday where she was given rx yesterday and a shot of Toradol with mild relief of sx.  Pt denies diarrhea, fever, chills, vomiting (today), chest pain, abdominal pain, chest pain, urinary sx, hitting her head, falling or dizziness at the time of the exam.  Family notes pt has hx of balance issue since surgery for menieres.  Family notes pt has hx of shingles, noting a cluster will form on the right side of her head with stress such as migraines.  Pt notes she has had a CT within the past x 12 months.  Family reports pt cannot take normal saline but can take Potassium Chloride.  Pt denies any other complaints or sx at this time.     PCP- Janit Pagan, NP   Neurology- Dr. Jettie Pagan    There are no other complaints, changes or physical findings at this time. 12:48 PM  Written by Wendall Stade, ED Scribe, as dictated by Barnie Del, MD.         Past Medical History   Diagnosis Date   ??? Other ill-defined conditions      shingles, lupus, menieres, Fibromyalgia          Past Surgical History   Procedure Date   ??? Hx gyn      hysterectomy   ??? Hx orthopaedic      back x2, right knee   ??? Hx urological      bladder surgery   ??? Hx transplant      right corona   ??? Hx heent      right eye corona transplant, both ears, tonsillectomy   ??? Hx cholecystectomy    ??? Hx appendectomy    ??? Abdomen surgery proc unlisted      1 foot of colon removed           No family history on file.     History   Social History   ??? Marital Status: Married      Spouse Name: N/A     Number of Children: N/A   ??? Years of Education: N/A   Occupational History   ??? Not on file.   Social History Main Topics   ??? Smoking status: Never Smoker    ??? Smokeless tobacco: Not on file   ??? Alcohol Use: No   ??? Drug Use:    ??? Sexually Active:    Other Topics Concern   ??? Not on file   Social History Narrative   ??? No narrative on file           ALLERGIES: Pcn, Morphine and Other      Review of Systems   Constitutional: Negative.  Negative for fever and chills.   HENT: Positive for neck pain (generalized).    Eyes: Positive for photophobia.   Respiratory: Negative.  Negative for cough and shortness of breath.    Cardiovascular: Negative.  Negative for chest pain and leg swelling.   Gastrointestinal: Positive for  nausea. Negative for vomiting (none today), abdominal pain and diarrhea.   Genitourinary: Negative.  Negative for dysuria, urgency, frequency, hematuria, enuresis and difficulty urinating.   Musculoskeletal: Gait problem: see hpi- chronic.   Skin: Negative.  Negative for rash.   Neurological: Positive for dizziness (see hpi), numbness (BUE) and headaches (Rt sided).   Hematological: Negative.    Psychiatric/Behavioral: Negative.    All other systems reviewed and are negative.        Filed Vitals:    12/25/09 1213   BP: 144/111   Pulse: 88   Temp: 98.1 ??F (36.7 ??C)   Resp: 20   Weight: 220 lb (99.791 kg)   SpO2: 97%              Physical Exam   Nursing note and vitals reviewed.  Constitutional: She is oriented to person, place, and time. She appears well-developed and well-nourished.        Appears uncomfortable, lying in bed, head covered in darkened room   HENT:   Head: Normocephalic and atraumatic.   Eyes: Conjunctivae and extraocular motions are normal. Pupils are equal, round, and reactive to light. Right eye exhibits discharge. Left eye exhibits no discharge.   Neck: Normal range of motion. Neck supple.   Cardiovascular: Normal rate, regular rhythm and intact distal pulses.     Pulmonary/Chest: Effort normal and breath sounds normal. No respiratory distress.   Abdominal: Soft. Bowel sounds are normal. No tenderness.   Musculoskeletal: Normal range of motion. She exhibits no edema and no tenderness.   Neurological: She is alert and oriented to person, place, and time. She has normal strength. No cranial nerve deficit or sensory deficit. She exhibits normal muscle tone. GCS eye subscore is 4. GCS verbal subscore is 5. GCS motor subscore is 6.   Skin: Skin is warm and dry. She is not diaphoretic.   Psychiatric: She has a normal mood and affect. Her behavior is normal.        MDM    Procedures    3:51 PM  Reassessed Katherine [name].  Pt's pain has not resolved but has become tolerable. Will re-medicate pt.   Written by Wendall Stade, ED Scribe, as dictated by Barnie Del, MD.      4:08 PM    I have reviewed all pertinent and currently available diagnostic test results for this visit including, but not limited to, labs, xrays, and EKGs. I have reviewed all pertinent and currently available medical records. My plan of care and further evaluation and/or disposition is based on these results, as well as the initial, and subsequent, history and physical exam, as well as any additional complaints during the visit.    Toney Rakes, MD     MEDICATIONS GIVEN:    No current facility-administered medications on file.     Current outpatient prescriptions   Medication Sig   ??? butalbital-acetaminophen-caffeine (FIORICET) 50-325-40 mg per tablet Take 1-2 Tabs by mouth every four (4) hours as needed for Pain.   ??? prochlorperazine (COMPAZINE) 10 mg tablet Take 1 Tab by mouth every eight (8) hours as needed for Nausea.   ??? potassium chloride SR (KLOR-CON 10) 10 mEq tablet Take 1 Tab by mouth daily.   ??? gabapentin (NEURONTIN) 300 mg capsule Take 600 mg by mouth three (3) times daily.   ??? valacyclovir (VALTREX) 1 g tablet Take 1,000 mg by mouth daily.    ??? duloxetine (CYMBALTA) 60 mg capsule Take 60 mg by mouth  two (2) times a day.   ??? triamterene-hydrochlorothiazide (DYAZIDE) 37.5-25 mg per capsule Take 1 Cap by mouth daily.   ??? hydrocodone-acetaminophen (LORTAB) 5-500 mg per tablet Take 1 Tab by mouth every eight (8) hours as needed.   ??? ergocalciferol (VITAMIN D) 50,000 unit capsule Take 50,000 Units by mouth every seven (7) days.   ??? cyclobenzaprine (FLEXERIL) 10 mg tablet Take 10 mg by mouth two (2) times a day.   ??? diazepam (VALIUM) 5 mg tablet Take 5 mg by mouth every four (4) hours.   ??? topiramate (TOPAMAX) 25 mg tablet Take  by mouth every seven (7) days.   ??? azelastine (ASTELIN) 137 mcg nasal spray 1 Spray by Nasal route two (2) times a day. Administer to  nostril.    ??? butalbital-acetaminophen-caffeine (FIORICET) 50-325-40 mg per tablet Take 2 Tabs by mouth every four (4) hours as needed.   ??? prednisoLONE acetate (PRED FORTE) 1 % ophthalmic suspension Administer 1 Drop to right eye daily.   ??? ESZOPICLONE (LUNESTA PO) Take  by mouth. Unknown dosage            LAB RESULTS:    Recent Results (from the past 12 hour(s))   CBC WITH AUTOMATED DIFF    Collection Time    12/25/09  1:35 PM   Component Value Range   ??? WBC 5.8  3.6 - 11.0 (K/uL)   ??? RBC 4.27  3.80 - 5.20 (Charliene Inoue/uL)   ??? HGB 14.7  11.5 - 16.0 (g/dL)   ??? HCT 40.6  35.0 - 47.0 (%)   ??? MCV 89.8  80.0 - 99.0 (FL)   ??? MCH 32.5  26.0 - 34.0 (PG)   ??? MCHC 36.2  30.0 - 36.5 (g/dL)   ??? RDW 13.0  11.5 - 14.5 (%)   ??? PLATELET 243  150 - 400 (K/uL)   ??? NEUTROPHILS 57  32 - 75 (%)   ??? LYMPHOCYTES 36  12 - 49 (%)   ??? MONOCYTES 5  5 - 13 (%)   ??? EOSINOPHILS 2  0 - 7 (%)   ??? BASOPHILS 0  0 - 1 (%)   ??? ABSOLUTE NEUTS 3.2  1.8 - 8.0 (K/UL)   ??? ABSOLUTE LYMPHS 2.1  0.8 - 3.5 (K/UL)   ??? ABSOLUTE MONOS 0.3  0.0 - 1.0 (K/UL)   ??? ABSOLUTE EOSINS 0.1  0.0 - 0.4 (K/UL)   ??? ABSOLUTE BASOS 0.0  0.0 - 0.1 (K/UL)   METABOLIC PANEL, COMPREHENSIVE    Collection Time    12/25/09  1:35 PM   Component Value Range    ??? Sodium 133 (*) 136 - 145 (MMOL/L)   ??? Potassium 3.1 (*) 3.5 - 5.1 (MMOL/L)   ??? Chloride 94 (*) 97 - 108 (MMOL/L)   ??? CO2 28  21 - 32 (MMOL/L)   ??? Anion gap 11  5 - 15 (mmol/L)   ??? Glucose 97  65 - 100 (MG/DL)   ??? BUN 12  6 - 20 (MG/DL)   ??? Creatinine 0.9  0.6 - 1.3 (MG/DL)   ??? BUN/Creatinine ratio 13  12 - 20 ( )   ??? GFR est AA >60  >60 (ml/min/1.54m2)   ??? GFR est non-AA >60  >60 (ml/min/1.11m2)   ??? Calcium 9.6  8.5 - 10.1 (MG/DL)   ??? Bilirubin, total 0.4  0.2 - 1.0 (MG/DL)   ??? ALT 41  12 - 78 (U/L)   ??? AST 46 (*) 15 - 37 (  U/L)   ??? Alk. phosphatase 110  50 - 136 (U/L)   ??? Protein, total 7.8  6.4 - 8.2 (g/dL)   ??? Albumin 4.0  3.5 - 5.0 (g/dL)   ??? Globulin 3.8  2.0 - 4.0 (g/dL)   ??? A-G Ratio 1.1  1.1 - 2.2 ( )       Imaging Results                                CT HEAD WITHOUT CONTRAST (Final result)   Result time:12/25/09 1419                Final result by Rad Results In Edi (12/25/09 1419)                Narrative:    **Final Report**      ICD Codes / Adm.Diagnosis: 161096 / Migraine   Examination: CT HEAD WO CON - [id number] - Dec 25 2009 2:14PM  Accession No: [id number]  Reason: Pain      REPORT:  INDICATION: Pain     Exam: Noncontrast CT of the brain is performed with 5 mm collimation.    Direct comparison is made to prior CT dated 10/08/2008.    FINDINGS: There is no acute intracranial hemorrhage, mass, mass effect or   herniation. Ventricular system is normal. The gray-white matter   differentiation is well-preserved. The visualized paranasal sinuses are   normal.      IMPRESSION: No acute intracranial hemorrhage, mass or infarct.         Interpreting/Reading Doctor: TODD B. BAIRD [phone])  Transcribed: on 12/25/2009  Approved: TODD B. BAIRD [phone]) 12/25/2009             IMPRESSION:    1. Migraine headache (346.90D)    2. Hypokalemia (276.8A)          5:36 PM    The patient's results have been reviewed with them.  Patient and/or family, verbally conveyed their understanding and agreement of the patient's signs, symptoms, diagnosis, treatment and prognosis and additionally agree to follow up as recommended or return to the Emergency Room should their condition change prior to their follow-up appointment. The patient verbally agrees with the care-plan and verbally conveys that all of their questions have been answered.   The discharge instructions have also been provided to the patient with some educational information regarding their diagnosis as well a list of reasons why they would want to return to the ER prior to their follow-up appointment, should their condition change.    Written by Wendall Stade, ED Scribe, as dictated by Barnie Del, MD.

## 2009-12-25 NOTE — ED Notes (Signed)
Mother and 2 neighbors at bedside, answering questions for pt and holding towel over eyes.  Pt reports beginning relief after meds given.

## 2009-12-25 NOTE — Progress Notes (Signed)
I have reviewed discharge instructions with the patient.  The patient verbalized understanding.

## 2009-12-25 NOTE — ED Notes (Signed)
States she feels much better; pain remains at right side of head but manageable.  Family at bedside.  Lights off.

## 2009-12-25 NOTE — ED Notes (Signed)
Ice pack refill provided.

## 2009-12-25 NOTE — ED Notes (Signed)
Unable to sign for paperwork.  Discharge out via wheelchair.

## 2010-02-04 ENCOUNTER — Other Ambulatory Visit

## 2010-09-22 LAB — EKG, 12 LEAD, INITIAL
Atrial Rate: 74 {beats}/min
Calculated P Axis: 38 degrees
Calculated R Axis: 19 degrees
Calculated T Axis: 30 degrees
Diagnosis: NORMAL
P-R Interval: 168 ms
Q-T Interval: 436 ms
QRS Duration: 90 ms
QTC Calculation (Bezet): 483 ms
Ventricular Rate: 74 {beats}/min

## 2010-09-22 LAB — METABOLIC PANEL, COMPREHENSIVE
A-G Ratio: 1.1 (ref 1.1–2.2)
ALT (SGPT): 35 U/L (ref 12–78)
AST (SGOT): 37 U/L (ref 15–37)
Albumin: 3.9 g/dL (ref 3.5–5.0)
Alk. phosphatase: 99 U/L (ref 50–136)
Anion gap: 8 mmol/L (ref 5–15)
BUN/Creatinine ratio: 14 (ref 12–20)
BUN: 13 MG/DL (ref 6–20)
Bilirubin, total: 0.4 MG/DL (ref 0.2–1.0)
CO2: 27 MMOL/L (ref 21–32)
Calcium: 9.1 MG/DL (ref 8.5–10.1)
Chloride: 104 MMOL/L (ref 97–108)
Creatinine: 0.9 MG/DL (ref 0.6–1.3)
GFR est AA: >60 mL/min/{1.73_m2} (ref >60)
GFR est non-AA: >60 mL/min/{1.73_m2} (ref >60)
Globulin: 3.5 g/dL (ref 2.0–4.0)
Glucose: 98 MG/DL (ref 65–100)
Potassium: 3.5 MMOL/L (ref 3.5–5.1)
Protein, total: 7.4 g/dL (ref 6.4–8.2)
Sodium: 139 MMOL/L (ref 136–145)

## 2010-09-22 LAB — CBC WITH AUTOMATED DIFF
ABS. BASOPHILS: 0 10*3/uL (ref 0.0–0.1)
ABS. EOSINOPHILS: 0.1 10*3/uL (ref 0.0–0.4)
ABS. LYMPHOCYTES: 1.6 10*3/uL (ref 0.8–3.5)
ABS. MONOCYTES: 0.4 10*3/uL (ref 0.0–1.0)
ABS. NEUTROPHILS: 2.1 10*3/uL (ref 1.8–8.0)
BASOPHILS: 0 % (ref 0–1)
EOSINOPHILS: 2 % (ref 0–7)
HCT: 39.1 % (ref 35.0–47.0)
HGB: 13.7 g/dL (ref 11.5–16.0)
LYMPHOCYTES: 39 % (ref 12–49)
MCH: 32.5 PG (ref 26.0–34.0)
MCHC: 35 g/dL (ref 30.0–36.5)
MCV: 92.7 FL (ref 80.0–99.0)
MONOCYTES: 9 % (ref 5–13)
NEUTROPHILS: 50 % (ref 32–75)
PLATELET: 243 10*3/uL (ref 150–400)
RBC: 4.22 M/uL (ref 3.80–5.20)
RDW: 13.5 % (ref 11.5–14.5)
WBC: 4.2 10*3/uL (ref 3.6–11.0)

## 2010-09-22 LAB — CK W/ CKMB & INDEX
CK - MB: <0.5 NG/ML — ABNORMAL LOW (ref 0.5–3.6)
CK: 38 U/L (ref 26–192)

## 2010-09-22 LAB — BLOOD GAS, ARTERIAL
BASE EXCESS: 1.3 mmol/L — AB
BICARBONATE: 26 mmol/L (ref 22–26)
O2 SAT: 94 % (ref 92–97)
PCO2: 40 mmHg (ref 35–45)
PO2: 69 mmHg — ABNORMAL LOW (ref 80–100)
SPONTANEOUS RATE: 18
pH: 7.43 (ref 7.35–7.45)

## 2010-09-22 LAB — TROPONIN I: Troponin-I, Qt.: <0.04 ng/mL (ref <0.05)

## 2010-09-22 MED ORDER — KETOROLAC TROMETHAMINE 30 MG/ML INJECTION
30 mg/mL (1 mL) | INTRAMUSCULAR | Status: AC
Start: 2010-09-22 — End: 2010-09-22
  Administered 2010-09-22: 17:00:00 via INTRAVENOUS

## 2010-09-22 MED ORDER — HYDROMORPHONE (PF) 1 MG/ML IJ SOLN
1 mg/mL | INTRAMUSCULAR | Status: AC
Start: 2010-09-22 — End: 2010-09-22
  Administered 2010-09-22: 17:00:00 via INTRAVENOUS

## 2010-09-22 MED ADMIN — sodium chloride (NS) 0.9 % flush: @ 19:00:00 | NDC 87701099893

## 2010-09-22 MED ADMIN — LORazepam (ATIVAN) injection 0.5 mg: INTRAVENOUS | @ 19:00:00 | NDC 10019010339

## 2010-09-22 MED ADMIN — LORazepam (ATIVAN) injection 0.5 mg: INTRAVENOUS | @ 17:00:00 | NDC 10019010339

## 2010-09-22 MED FILL — LORAZEPAM 4 MG/ML INJECTION: 4 mg/mL | INTRAMUSCULAR | Qty: 1

## 2010-09-22 MED FILL — HYDROMORPHONE (PF) 1 MG/ML IJ SOLN: 1 mg/mL | INTRAMUSCULAR | Qty: 1

## 2010-09-22 MED FILL — SALINE FLUSH INJECTION SYRINGE: INTRAMUSCULAR | Qty: 10

## 2010-09-22 MED FILL — KETOROLAC TROMETHAMINE 30 MG/ML INJECTION: 30 mg/mL (1 mL) | INTRAMUSCULAR | Qty: 1

## 2010-09-22 NOTE — ED Notes (Signed)
Pt rang the call bell for assistance to the restroom. Pt  Was dressed, shoes were placed on her feet. Pt stood up and threw her arms up in the air, said she was slightly dizzy. Pt VS were obtained and MD notified.  Pt was assisted back into the bed and was encouraged to use a bedpan.

## 2010-09-22 NOTE — ED Notes (Signed)
Patient/parent has been given discharge instructions to home. Pt/parent has verbalized understanding and is ambulatory to the exit without difficulty. Pt in NAD at the time of discharge.

## 2010-09-22 NOTE — ED Notes (Signed)
Kramer at the bedside.

## 2010-09-22 NOTE — ED Notes (Signed)
Assumed care of patient in task only

## 2010-09-22 NOTE — ED Notes (Signed)
Pt is still complaining that she is having palpitations, the monitor shows NSR with a rate of 85 no ectopy. Pt speeds up her breathing and then says she is going to faint and that she can't breathe. Pt is maintaining excellent oxygen saturation. Pt has call bell at her bedside and never calls out or asks for anything until the family comes around.
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
## 2010-10-03 DIAGNOSIS — R002 Palpitations: Secondary | ICD-10-CM

## 2010-10-03 NOTE — ED Notes (Signed)
Pt medicated per MD order. WIll continue to monitor and reassess pt

## 2010-10-03 NOTE — ED Notes (Signed)
Pt assisted back to bed from bedside commode. Pt tolerated well

## 2010-10-03 NOTE — ED Notes (Signed)
Pt medicated per MD order. Will continue to monitor and reassess pt

## 2010-10-03 NOTE — ED Notes (Signed)
Pt placed on monitor x 3

## 2010-10-03 NOTE — ED Notes (Signed)
Pt assisted to bedside commode. Pt tolerated well.

## 2010-10-03 NOTE — ED Provider Notes (Addendum)
HPI Comments: 55 yo F presents ambulatory to ED with CC of CP and heart palpitations x this evening. Pt reports she was seen in ED on 09/22/2010 for similar sx's. Pt reports CP is intermittent, L-sided radiating to L shoulder, and feels like "pressure". Pt also complains of light-headedness, numbness in BL hands and BL feet, diaphoresis, nausea, and SOB. Pt states she followed up with Dr. Frankey Poot, who placed her on a portable heart monitor and instructed her to follow up on 10/08/2010. Pt states she was sitting at home talking with her daughters at sx onset. Pt denies being under stress and states she had "a good day" today. Pt states episode last approximately 30 minutes. Pt reports she had a stress test, but it was a long time ago.    UJW:JXBJYNW L Reed, NP   PMHx: significant for lupus, menieres  FMHx: sister had heart ablation    There are no other complaints, changes or physical findings at this time.   Written by Katy Apo. Heflin, ED Scribe, as dictated by Barnie Del, MD.     The history is provided by the patient.        Past Medical History   Diagnosis Date   ??? Other ill-defined conditions      shingles, lupus, menieres, Fibromyalgia        Past Surgical History   Procedure Date   ??? Hx gyn      hysterectomy   ??? Hx orthopaedic      back x2, right knee   ??? Hx urological      bladder surgery   ??? Hx transplant      right corona   ??? Hx heent      right eye corona transplant, both ears, tonsillectomy   ??? Hx cholecystectomy    ??? Hx appendectomy    ??? Abdomen surgery proc unlisted      1 foot of colon removed         No family history on file.     History     Social History   ??? Marital Status: Widowed     Spouse Name: N/A     Number of Children: N/A   ??? Years of Education: N/A     Occupational History   ??? Not on file.     Social History Main Topics   ??? Smoking status: Never Smoker    ??? Smokeless tobacco: Not on file   ??? Alcohol Use: No   ??? Drug Use:    ??? Sexually Active:      Other Topics Concern    ??? Not on file     Social History Narrative   ??? No narrative on file                  ALLERGIES: Morphine; Other; and Pcn      Review of Systems   Constitutional: Positive for diaphoresis.   HENT: Negative.    Eyes: Negative.    Respiratory: Positive for shortness of breath.    Cardiovascular: Positive for chest pain and palpitations.   Gastrointestinal: Positive for nausea.   Genitourinary: Negative.    Musculoskeletal: Negative.    Skin: Negative.    Neurological: Positive for light-headedness and numbness.   Hematological: Negative.    Psychiatric/Behavioral: Negative.    [all other systems reviewed and are negative        Filed Vitals:    10/03/10 2210   BP: 138/67  Pulse: 70   Resp: 20   Height: 5\' 8"  (1.727 Rhapsody Wolven)   Weight: 200 lb (90.719 kg)   SpO2: 100%            Physical Exam   [nursing notereviewed.  Constitutional: She is oriented to person, place, and time. She appears well-developed and well-nourished. No distress.   HENT:   Head: Normocephalic and atraumatic.   Eyes: Conjunctivae and EOM are normal. Pupils are equal, round, and reactive to light.   Neck: Normal range of motion. Neck supple. No JVD present.   Cardiovascular: Normal rate, regular rhythm, normal heart sounds and intact distal pulses.    No murmur heard.  Pulmonary/Chest: Effort normal and breath sounds normal. No respiratory distress. She has no wheezes. She has no rales.   Abdominal: Soft. Bowel sounds are normal. No tenderness.   Musculoskeletal: Normal range of motion. She exhibits no edema and no tenderness.   Neurological: She is alert and oriented to person, place, and time. She has normal strength. No cranial nerve deficit or sensory deficit. She exhibits normal muscle tone.   Skin: Skin is warm and dry. No rash noted.   Psychiatric: She has a normal mood and affect. Her behavior is normal.        MDM     Differential Diagnosis; Clinical Impression; Plan:      [Symptomatic episode of palpitations, labs show low potassium, will talk with Dr Pricilla Holm about admission  Amount and/or Complexity of Data Reviewed:   Clinical lab tests:  [ordered and reviewed  Tests in the radiology section of CPT??:  [ordered and reviewed  Tests in the medicine section of the CPT??:  [ordered and reviewed  Discussion of test results with the performing providers:  Yes   Review and summarize past medical records:  Yes   Discuss the patient with another provider:  Yes   Independant visualization of image, tracing, or specimen:  Yes  Progress:   Patient progress:  [improved and stable      Procedures    Pt with episode of palpitations during HPI, monitor showed PVCs.    CONSULT NOTE:   11:22 PM  Chevon Fomby Laurier Nancy, MD spoke with Dr. Frankey Poot,   Specialty: Cardiology  Discussed pt's hx, disposition, and available diagnostic and imaging results. Reviewed care plans. Consulting physician agrees with plans as outlined.  Discussed pt's sx's and care-plan with Dr. Pricilla Holm. Dr. Pricilla Holm agrees with the plan to run tests. Dr. Pricilla Holm states if tests are normal, have pt f/u in office as scheduled and if tests are abnormal, or pt is symptomatic admit.   Written by Katy Apo. Heflin, ED Scribe, as dictated by Barnie Del, MD.     CONSULT NOTE:   12:13 AM  Barnie Del, MD spoke with Dr. Howell Pringle,   Specialty: Hospitalist  Discussed pt's hx, disposition, and available diagnostic and imaging results. Reviewed care plans. Consulting physician agrees with plans as outlined.  Dr. Tammi Klippel requests that Dr. Duffy Rhody be called about admittance.   Written by Katy Apo. Heflin, ED Scribe, as dictated by Barnie Del, MD.    12:32 AM     I have reviewed all pertinent and currently available diagnostic test results for this visit including, but not limited to, labs, xrays, and EKGs. I have reviewed all pertinent and currently available medical records. My plan of care and further evaluation and/or disposition is based on these results, as well as the initial, and subsequent, history and  physical exam, as well as any additional complaints during the visit.    Toney Rakes, MD     EKG interpretation: (Preliminary)  Rhythm: normal sinus rhythm; and regular . Rate (approx.): 70; Blocks: none; Ectopy: none; Axis: normal; P wave: normal; QRS interval: normal ; ST/T wave: non-specific changes; in  Lead: ; Other findings: .  This EKG was interpreted by Barnie Del, MD ,ED Provider.         CONSULT NOTE:   12:45 AM  Judie Petit Laurier Nancy, MD spoke with Dr. Frankey Poot,   Specialty: Cardiology  Discussed pt's hx, disposition, and available diagnostic and imaging results. Reviewed care plans. Consulting physician agrees with plans as outlined.  Dr, Pricilla Holm would like hospitalist to admit for observation.   Written by Katy Apo. Heflin, ED Scribe, as dictated by Barnie Del, MD.    CONSULT NOTE:   12:45 AM  Barnie Del, MD spoke with Dr. Howell Pringle,   Specialty: Hospitalist  Discussed pt's hx, disposition, and available diagnostic and imaging results. Reviewed care plans. Consulting physician agrees with plans as outlined.  Dr. Tammi Klippel will see and admit.   Written by Katy Apo. Heflin, ED Scribe, as dictated by Barnie Del, MD.    12:48 AM  Patient is being admitted to the hospital.  The results of their tests and reasons for their admission have been discussed with them and/or available family.  They convey agreement and understanding for the need to be admitted and for their admission diagnosis.  Consultation has be made now with the inpatient physician specialist for hospitalization.   Written by Katy Apo. Heflin, ED Scribe, as dictated by Barnie Del, MD.     CONSULT NOTE:   10:30 AM  Deloria Lair, MD spoke with Dr. Pricilla Holm,   Specialty: Cardiology  Dr. Pricilla Holm called and stated patient can be discharged home, and was probably having esophogeal spasms.  Written by Erin Hearing. Argie Ramming, ED Scribe, as dictated by Deloria Lair, MD.          MEDICATIONS GIVEN:    No current facility-administered medications for this encounter.     Current Outpatient Prescriptions   Medication Sig   ??? topiramate (TOPAMAX) 25 mg tablet Take 100 mg by mouth nightly as needed.   ??? topiramate (TOPAMAX) 200 mg tablet Take 200 mg by mouth every morning.   ??? Cetirizine (ZYRTEC) 10 mg Cap Take 10 mg by mouth.   ??? zolpidem (AMBIEN) 10 mg tablet Take 10 mg by mouth nightly as needed.   ??? ketorolac (TORADOL) 10 mg tablet Take 10 mg by mouth.   ??? hydroxychloroquine (PLAQUENIL) 200 mg tablet Take 200 mg by mouth daily.   ??? meloxicam (MOBIC) 15 mg tablet Take 15 mg by mouth daily.   ??? carisoprodol (SOMA) 350 mg tablet Take 350 mg by mouth four (4) times daily.   ??? butalbital-acetaminophen-caffeine (FIORICET) 50-325-40 mg per tablet Take 1-2 Tabs by mouth every four (4) hours as needed for Pain.   ??? prochlorperazine (COMPAZINE) 10 mg tablet Take 1 Tab by mouth every eight (8) hours as needed for Nausea.   ??? potassium chloride SR (KLOR-CON 10) 10 mEq tablet Take 1 Tab by mouth daily.   ??? gabapentin (NEURONTIN) 300 mg capsule Take 600 mg by mouth three (3) times daily.   ??? valacyclovir (VALTREX) 1 g tablet Take 1,000 mg by mouth daily.   ??? duloxetine (CYMBALTA) 60 mg capsule Take 60 mg by mouth two (  2) times a day.   ??? triamterene-hydrochlorothiazide (DYAZIDE) 37.5-25 mg per capsule Take 1 Cap by mouth daily.   ??? hydrocodone-acetaminophen (LORTAB) 5-500 mg per tablet Take 1 Tab by mouth every eight (8) hours as needed.   ??? ergocalciferol (VITAMIN D) 50,000 unit capsule Take 50,000 Units by mouth every seven (7) days.    ??? cyclobenzaprine (FLEXERIL) 10 mg tablet Take 10 mg by mouth two (2) times a day.   ??? diazepam (VALIUM) 5 mg tablet Take 5 mg by mouth every four (4) hours.   ??? azelastine (ASTELIN) 137 mcg nasal spray 1 Spray by Nasal route two (2) times a day. Administer to  nostril.    ??? butalbital-acetaminophen-caffeine (FIORICET) 50-325-40 mg per tablet Take 2 Tabs by mouth every four (4) hours as needed.   ??? prednisoLONE acetate (PRED FORTE) 1 % ophthalmic suspension Administer 1 Drop to right eye daily.   ??? ESZOPICLONE (LUNESTA PO) Take  by mouth. Unknown dosage        IMPRESSION:    1. Palpitations    2. Symptomatic PVCs    3. Chest pain    4. Near syncope        PLAN:    1. Plan per hospitalist/cardiology

## 2010-10-04 ENCOUNTER — Inpatient Hospital Stay
Admit: 2010-10-04 | Discharge: 2010-10-04 | Disposition: A | Discharge status: Home / Self Care | Payer: MEDICARE | Attending: Emergency Medicine | Admitting: Emergency Medicine

## 2010-10-04 LAB — EKG, 12 LEAD, INITIAL
Atrial Rate: 70 {beats}/min
Calculated P Axis: 57 degrees
Calculated R Axis: 43 degrees
Calculated T Axis: 55 degrees
Diagnosis: NORMAL
P-R Interval: 174 ms
Q-T Interval: 434 ms
QRS Duration: 94 ms
QTC Calculation (Bezet): 468 ms
Ventricular Rate: 70 {beats}/min

## 2010-10-04 LAB — METABOLIC PANEL, COMPREHENSIVE
A-G Ratio: 1.1 (ref 1.1–2.2)
ALT (SGPT): 39 U/L (ref 12–78)
AST (SGOT): 28 U/L (ref 15–37)
Albumin: 3.6 g/dL (ref 3.5–5.0)
Alk. phosphatase: 97 U/L (ref 50–136)
Anion gap: 9 mmol/L (ref 5–15)
BUN/Creatinine ratio: 14 (ref 12–20)
BUN: 13 MG/DL (ref 6–20)
Bilirubin, total: 0.3 MG/DL (ref 0.2–1.0)
CO2: 26 MMOL/L (ref 21–32)
Calcium: 9 MG/DL (ref 8.5–10.1)
Chloride: 101 MMOL/L (ref 97–108)
Creatinine: 0.9 MG/DL (ref 0.6–1.3)
GFR est AA: >60 mL/min/{1.73_m2} (ref >60)
GFR est non-AA: >60 mL/min/{1.73_m2} (ref >60)
Globulin: 3.2 g/dL (ref 2.0–4.0)
Glucose: 102 MG/DL — ABNORMAL HIGH (ref 65–100)
Potassium: 3.2 MMOL/L — ABNORMAL LOW (ref 3.5–5.1)
Protein, total: 6.8 g/dL (ref 6.4–8.2)
Sodium: 136 MMOL/L (ref 136–145)

## 2010-10-04 LAB — PTT: aPTT: 24.7 s (ref 24.0–31.5)

## 2010-10-04 LAB — ECG RHYTHM ANALYSIS ADULT
Atrial Rate: 72 {beats}/min
Calculated P Axis: 64 degrees
Calculated R Axis: 41 degrees
Calculated T Axis: 55 degrees
Diagnosis: NORMAL
P-R Interval: 166 ms
Q-T Interval: 404 ms
QRS Duration: 90 ms
QTC Calculation (Bezet): 442 ms
Ventricular Rate: 72 {beats}/min

## 2010-10-04 LAB — CBC WITH AUTOMATED DIFF
ABS. BASOPHILS: 0 10*3/uL (ref 0.0–0.1)
ABS. EOSINOPHILS: 0 10*3/uL (ref 0.0–0.4)
ABS. LYMPHOCYTES: 2.5 10*3/uL (ref 0.8–3.5)
ABS. MONOCYTES: 0.6 10*3/uL (ref 0.0–1.0)
ABS. NEUTROPHILS: 6.3 10*3/uL (ref 1.8–8.0)
BASOPHILS: 0 % (ref 0–1)
EOSINOPHILS: 0 % (ref 0–7)
HCT: 36.3 % (ref 35.0–47.0)
HGB: 13.1 g/dL (ref 11.5–16.0)
LYMPHOCYTES: 27 % (ref 12–49)
MCH: 33.1 PG (ref 26.0–34.0)
MCHC: 36.1 g/dL (ref 30.0–36.5)
MCV: 91.7 FL (ref 80.0–99.0)
MONOCYTES: 6 % (ref 5–13)
NEUTROPHILS: 67 % (ref 32–75)
PLATELET: 250 10*3/uL (ref 150–400)
RBC: 3.96 M/uL (ref 3.80–5.20)
RDW: 13.1 % (ref 11.5–14.5)
WBC: 9.4 10*3/uL (ref 3.6–11.0)

## 2010-10-04 LAB — SED RATE (ESR): Sed rate (ESR): 23 MM/HR (ref 0–30)

## 2010-10-04 LAB — URINALYSIS W/MICROSCOPIC
Bacteria: NEGATIVE /HPF
Bilirubin: NEGATIVE
Blood: NEGATIVE
Glucose: NEGATIVE MG/DL
Ketone: NEGATIVE MG/DL
Nitrites: NEGATIVE
Protein: NEGATIVE MG/DL
Specific gravity: 1.007 (ref 1.003–1.030)
Urobilinogen: 0.2 EU/DL (ref 0.2–1.0)
pH (UA): 7.5 (ref 5.0–8.0)

## 2010-10-04 LAB — MAGNESIUM: Magnesium: 2.2 MG/DL (ref 1.6–2.4)

## 2010-10-04 LAB — TROPONIN I
Troponin-I, Qt.: <0.04 ng/mL (ref <0.05)
Troponin-I, Qt.: <0.04 ng/mL (ref <0.05)

## 2010-10-04 LAB — PROTHROMBIN TIME + INR
INR: 1 (ref 0.9–1.1)
Prothrombin time: 10 s (ref 9.0–11.0)

## 2010-10-04 LAB — NT-PRO BNP: NT pro-BNP: 25 PG/ML (ref 0–125)

## 2010-10-04 LAB — CK W/ REFLX CKMB: CK: 34 U/L (ref 26–192)

## 2010-10-04 LAB — TSH 3RD GENERATION: TSH: 1.52 u[IU]/mL (ref 0.36–3.74)

## 2010-10-04 MED ORDER — TRIAMTERENE-HYDROCHLOROTHIAZIDE 37.5 MG-25 MG CAP
Freq: Every day | ORAL | Status: DC
Start: 2010-10-04 — End: 2010-10-04
  Administered 2010-10-04: 16:00:00 via ORAL

## 2010-10-04 MED ORDER — LORAZEPAM 0.5 MG TAB
0.5 mg | ORAL | Status: AC
Start: 2010-10-04 — End: 2010-10-03
  Administered 2010-10-04: 04:00:00 via ORAL

## 2010-10-04 MED ORDER — POTASSIUM CHLORIDE SR 10 MEQ TAB
10 mEq | ORAL | Status: AC
Start: 2010-10-04 — End: 2010-10-03
  Administered 2010-10-04: 05:00:00 via ORAL

## 2010-10-04 MED ORDER — TOPIRAMATE 100 MG TAB
100 mg | ORAL | Status: AC
Start: 2010-10-04 — End: 2010-10-04
  Administered 2010-10-04: 08:00:00 via ORAL

## 2010-10-04 MED ORDER — NALOXONE 0.4 MG/ML INJECTION
0.4 mg/mL | INTRAMUSCULAR | Status: DC | PRN
Start: 2010-10-04 — End: 2010-10-04

## 2010-10-04 MED ORDER — SALINE PERIPHERAL FLUSH PRN
INTRAMUSCULAR | Status: DC | PRN
Start: 2010-10-04 — End: 2010-10-04

## 2010-10-04 MED ORDER — DULOXETINE 30 MG CAP, DELAYED RELEASE
30 mg | ORAL | Status: AC
Start: 2010-10-04 — End: 2010-10-04
  Administered 2010-10-04: 08:00:00 via ORAL

## 2010-10-04 MED ORDER — ONDANSETRON 4 MG TAB, RAPID DISSOLVE
4 mg | ORAL | Status: DC | PRN
Start: 2010-10-04 — End: 2010-10-04

## 2010-10-04 MED ORDER — GABAPENTIN 300 MG CAP
300 mg | ORAL | Status: AC
Start: 2010-10-04 — End: 2010-10-04
  Administered 2010-10-04: 08:00:00 via ORAL

## 2010-10-04 MED ORDER — MELOXICAM 7.5 MG TAB
7.5 mg | Freq: Every day | ORAL | Status: DC
Start: 2010-10-04 — End: 2010-10-04
  Administered 2010-10-04: 16:00:00 via ORAL

## 2010-10-04 MED ORDER — ZOLPIDEM 5 MG TAB
5 mg | Freq: Every evening | ORAL | Status: DC | PRN
Start: 2010-10-04 — End: 2010-10-04

## 2010-10-04 MED ORDER — TOPIRAMATE 100 MG TAB
100 mg | Freq: Every evening | ORAL | Status: DC
Start: 2010-10-04 — End: 2010-10-04

## 2010-10-04 MED ORDER — VALACYCLOVIR 500 MG TAB
500 mg | Freq: Every day | ORAL | Status: DC
Start: 2010-10-04 — End: 2010-10-04
  Administered 2010-10-04: 16:00:00 via ORAL

## 2010-10-04 MED ORDER — HYDROCODONE-ACETAMINOPHEN 5 MG-500 MG TAB
5-500 mg | Freq: Four times a day (QID) | ORAL | Status: DC | PRN
Start: 2010-10-04 — End: 2010-10-04

## 2010-10-04 MED ORDER — KETOROLAC TROMETHAMINE 30 MG/ML INJECTION
30 mg/mL (1 mL) | INTRAMUSCULAR | Status: AC
Start: 2010-10-04 — End: 2010-10-04
  Administered 2010-10-04: 06:00:00 via INTRAVENOUS

## 2010-10-04 MED ORDER — PROCHLORPERAZINE MALEATE 5 MG TAB
5 mg | Freq: Three times a day (TID) | ORAL | Status: DC | PRN
Start: 2010-10-04 — End: 2010-10-04

## 2010-10-04 MED ORDER — ENOXAPARIN 40 MG/0.4 ML SUB-Q SYRINGE
40 mg/0.4 mL | Freq: Every day | SUBCUTANEOUS | Status: DC
Start: 2010-10-04 — End: 2010-10-04

## 2010-10-04 MED ORDER — CARISOPRODOL 350 MG TAB
350 mg | Freq: Four times a day (QID) | ORAL | Status: DC | PRN
Start: 2010-10-04 — End: 2010-10-04

## 2010-10-04 MED ORDER — CYCLOBENZAPRINE 10 MG TAB
10 mg | Freq: Two times a day (BID) | ORAL | Status: DC
Start: 2010-10-04 — End: 2010-10-04
  Administered 2010-10-04: 14:00:00 via ORAL

## 2010-10-04 MED ORDER — SALINE PERIPHERAL FLUSH Q8H
Freq: Three times a day (TID) | INTRAMUSCULAR | Status: DC
Start: 2010-10-04 — End: 2010-10-04

## 2010-10-04 MED ORDER — PREDNISONE 5 MG TAB
5 mg | Freq: Every day | ORAL | Status: DC
Start: 2010-10-04 — End: 2010-10-04
  Administered 2010-10-04: 13:00:00 via ORAL

## 2010-10-04 MED ORDER — DIAZEPAM 5 MG TAB
5 mg | ORAL | Status: DC | PRN
Start: 2010-10-04 — End: 2010-10-04

## 2010-10-04 MED ADMIN — gabapentin (NEURONTIN) capsule 600 mg: ORAL | @ 14:00:00 | NDC 63739037510

## 2010-10-04 MED ADMIN — HYDROcodone-acetaminophen (NORCO) 5-325 mg per tablet 1 Tab: ORAL | @ 13:00:00 | NDC 00406036501

## 2010-10-04 MED ADMIN — hydroxychloroquine (PLAQUINIL) tablet 200 mg: ORAL | @ 16:00:00 | NDC 00378037301

## 2010-10-04 MED FILL — TOPIRAMATE 100 MG TAB: 100 mg | ORAL | Qty: 2

## 2010-10-04 MED FILL — MELOXICAM 7.5 MG TAB: 7.5 mg | ORAL | Qty: 2

## 2010-10-04 MED FILL — GABAPENTIN 300 MG CAP: 300 mg | ORAL | Qty: 1

## 2010-10-04 MED FILL — GABAPENTIN 300 MG CAP: 300 mg | ORAL | Qty: 2

## 2010-10-04 MED FILL — K-TAB 10 MEQ TABLET,EXTENDED RELEASE: 10 mEq | ORAL | Qty: 4

## 2010-10-04 MED FILL — HYDROCODONE-ACETAMINOPHEN 5 MG-325 MG TAB: 5-325 mg | ORAL | Qty: 1

## 2010-10-04 MED FILL — VALTREX 500 MG TABLET: 500 mg | ORAL | Qty: 2

## 2010-10-04 MED FILL — HYDROXYCHLOROQUINE 200 MG TAB: 200 mg | ORAL | Qty: 1

## 2010-10-04 MED FILL — KETOROLAC TROMETHAMINE 30 MG/ML INJECTION: 30 mg/mL (1 mL) | INTRAMUSCULAR | Qty: 1

## 2010-10-04 MED FILL — CYMBALTA 30 MG CAPSULE,DELAYED RELEASE: 30 mg | ORAL | Qty: 2

## 2010-10-04 MED FILL — LORAZEPAM 0.5 MG TAB: 0.5 mg | ORAL | Qty: 2

## 2010-10-04 MED FILL — DIAZEPAM 5 MG TAB: 5 mg | ORAL | Qty: 1

## 2010-10-04 MED FILL — CYCLOBENZAPRINE 10 MG TAB: 10 mg | ORAL | Qty: 1

## 2010-10-04 MED FILL — TRIAMTERENE-HYDROCHLOROTHIAZIDE 37.5 MG-25 MG CAP: ORAL | Qty: 1

## 2010-10-04 MED FILL — TOPIRAMATE 100 MG TAB: 100 mg | ORAL | Qty: 1

## 2010-10-04 MED FILL — CARISOPRODOL 350 MG TAB: 350 mg | ORAL | Qty: 1

## 2010-10-04 MED FILL — PREDNISONE 5 MG TAB: 5 mg | ORAL | Qty: 2

## 2010-10-04 NOTE — ED Notes (Signed)
Patient medicated with available med's in the ER will call pharmacy for the rest of her medications. Patient was feeling anxious and states she had a headache she was given norco for pain has refused the valium at this time . It was returned to [facility] with donna rice rn.

## 2010-10-04 NOTE — ED Notes (Signed)
Assumed care of patient. Patient placed in position on comfort.  No complaints at this time will continue to monitor.Iv site shows no signs if infiltration, no redness, no swelling, infusing without difficulty.

## 2010-10-04 NOTE — Consults (Signed)
I see no evidence for an active cardiac problem. Troponin neg x2. Pain is likely esophageal by history.

## 2010-10-04 NOTE — ED Notes (Signed)
Bedside and Verbal shift change report given to Jane Pugh, RN (oncoming nurse) by Rebecca Hatch, RN (offgoing nurse).  Report given with SBAR, ED Summary and MAR.

## 2010-10-04 NOTE — ED Notes (Signed)
Dr. Tammi Klippel, hospitalist at bedside

## 2010-10-04 NOTE — ED Notes (Signed)
Dr. Fannon at bedside

## 2010-10-04 NOTE — ED Notes (Signed)
Pt states "I feel like an elephant is sitting on my chest." Pt requesting MD come talk to her. Dr. Tammi Klippel aware at this time.

## 2010-10-04 NOTE — ED Notes (Signed)
Pt and pts family updated with plan of care

## 2010-10-04 NOTE — ED Notes (Signed)
In to reassess pts pain, pt rates pain 2/10. Pt satisfied with this pain rating.

## 2010-10-04 NOTE — ED Notes (Signed)
Per Dr. Tammi Klippel, SED rate to be drawn with troponin at 0400. Pt medicated per MD order. WIll continue to monitor and reassess pt
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
## 2010-10-04 NOTE — ED Notes (Signed)
Spoke with dr. Allena Katz about patients diet he states he left her without diet till dr. Pricilla Holm sees her because he is not sure if a stress test will be done.  Consult to dr. Pricilla Holm and he states keep pt NPO for now and stress test ordered . Dr. Pricilla Holm will see patient during stress test.  Patient had urine out put and large bowel movement .

## 2010-10-04 NOTE — ED Notes (Signed)
Pharmacy called will send medication and change time on topamax

## 2010-10-04 NOTE — ED Notes (Signed)
Patient assisted to bedside commode.  Vitals stable.

## 2010-10-04 NOTE — ED Notes (Signed)
patient resting quietly on stretcher with daughter @ bedside

## 2010-10-04 NOTE — H&P (Signed)
Hospitalist Admission Note    NAME: Katherine [name]   DOB:  1955/10/20   MRN:  [id number]     Date/Time:  10/04/2010 1:53 AM    Patient PCP: Katherine Pagan, NP  ________________________________________________________________________   Assessment/Plan:    Chest pain with palpitations - not sure the etiology. Currently being worked-up by Katherine [name] as outpt with event monitor. No ischemic changes on EKG and initial troponin negative. Will repeat for trend esp as had ongoing chest pain in ED. Place on telemetry. Consult Katherine [name] to see pt in am. Also check ECHO and consider stress testing. Constellation of symptoms not typical for TIA/CVA - had normal CT with last presentation also. Consider MRI/MRA if not felt to be cardiac. She has been seen by Katherine [name] in past for migraines. BP ok - have not started BB. Did receive an aspirin prior to arrival in ED.     SLE - only on plaquenil currently but has chronic pain in knees and recently had steroid injections then started on steroid dose pack. ? Serositis/pericarditis causing chest pain due to lupus flare possible but symptoms seem to episodic for this. Will check sed rate but has been on steroids for last 1.5 days. Checking ECHO.     Hypokalemia - mild, due to diuretic. Supplement and recheck.     Fibromyalgia with chronic pain syndrome - on a huge number of psychoactive, sedating meds. Need to limit use esp narcotics/ Possible that combination of her meds may be contributing to some of her symptoms.     Anxiety/depression - ? Whether she is having panic attacks. Her constellation of symptoms certainly fit with this. Psych evaluation may be reasonable if other w/u unrevealing.     Code status - full. Sister is POA.            Subjective:   CHIEF COMPLAINT:  "CP and palpitations"    HISTORY OF PRESENT ILLNESS:      Katherine [name] is a 55 y.o.  Caucasian female whom presents with complaint noted above. She has a very complex medical hx including Meniere's disease which is intermittently symptomatic with vertigo, SLE with recent increase knee pain, fibromyalgia with chronic pain, frequent migraine HA, anxiety/depression.  She presented with an episode of  chest pain radiating to left shoulder and back which she described as  pressure-like moderate to severe in intentisity associated with palpitations which lasted for approx 30 minutes. Resolved on own and not associated with activity. Some SOB - was hyperventilating per family. Some light-headedness (almost felt like she was passing out) and bilateral hand numbness and HA with this as well. Similar episode several weeks ago with the palpitations and light-headedness but no chest pain. She has been seen by Katherine [name] as outpt for w/u of this and currently has an event monitor in place. She has had several episodes of palpitations but no arrhythmias noted per her report. She has no hx of CAD and lat stress test was many years ago.   We were asked to admit for work up and evaluation of the above problems.     Past Medical History   Diagnosis Date   ??? Other ill-defined conditions      shingles, lupus, menieres, Fibromyalgia      Past Surgical History   Procedure Date   ??? Hx gyn      hysterectomy   ??? Hx orthopaedic      back x2, right knee   ??? Hx urological  bladder surgery   ??? Hx transplant      right corona   ??? Hx heent      right eye corona transplant, both ears, tonsillectomy   ??? Hx cholecystectomy    ??? Hx appendectomy    ??? Abdomen surgery proc unlisted      1 foot of colon removed     History   Substance Use Topics   ??? Smoking status: Never Smoker    ??? Smokeless tobacco: Not on file   ??? Alcohol Use: No    Lives with husband, on disability    FH: sister with arrhythmia (required ?ablation)    Allergies   Allergen Reactions   ??? Morphine Other (comments)      "it does not stop the pain"   ??? Other Other (comments)     Pt must have low sodium fluid because it will cause an attack of the mineres   ??? Pcn (Penicillins) Rash and Swelling        Prior to Admission medications    Medication Sig Start Date End Date Taking? Authorizing Provider   topiramate (TOPAMAX) 25 mg tablet Take 100 mg by mouth nightly as needed.    Phys Other, MD   topiramate (TOPAMAX) 200 mg tablet Take 200 mg by mouth every morning.    Phys Other, MD   Cetirizine (ZYRTEC) 10 mg Cap Take 10 mg by mouth.    Phys Other, MD   zolpidem (AMBIEN) 10 mg tablet Take 10 mg by mouth nightly as needed.    Phys Other, MD   ketorolac (TORADOL) 10 mg tablet Take 10 mg by mouth.    Phys Other, MD   hydroxychloroquine (PLAQUENIL) 200 mg tablet Take 200 mg by mouth daily.    Phys Other, MD   meloxicam (MOBIC) 15 mg tablet Take 15 mg by mouth daily.    Phys Other, MD   carisoprodol (SOMA) 350 mg tablet Take 350 mg by mouth four (4) times daily.    Phys Other, MD   butalbital-acetaminophen-caffeine (FIORICET) 50-325-40 mg per tablet Take 1-2 Tabs by mouth every four (4) hours as needed for Pain. 12/25/09   Katherine Del, MD   prochlorperazine (COMPAZINE) 10 mg tablet Take 1 Tab by mouth every eight (8) hours as needed for Nausea. 12/25/09   Katherine Del, MD   potassium chloride SR (KLOR-CON 10) 10 mEq tablet Take 1 Tab by mouth daily. 12/25/09   Katherine Del, MD   gabapentin (NEURONTIN) 300 mg capsule Take 600 mg by mouth three (3) times daily.    Phys Other, MD   valacyclovir (VALTREX) 1 g tablet Take 1,000 mg by mouth daily. 08/28/09   Phys Other, MD   duloxetine (CYMBALTA) 60 mg capsule Take 60 mg by mouth two (2) times a day. 08/28/09   Phys Other, MD   triamterene-hydrochlorothiazide (DYAZIDE) 37.5-25 mg per capsule Take 1 Cap by mouth daily.    Phys Other, MD   hydrocodone-acetaminophen (LORTAB) 5-500 mg per tablet Take 1 Tab by mouth every eight (8) hours as needed. 08/28/09   Phys Other, MD    ergocalciferol (VITAMIN D) 50,000 unit capsule Take 50,000 Units by mouth every seven (7) days. 08/28/09   Phys Other, MD   cyclobenzaprine (FLEXERIL) 10 mg tablet Take 10 mg by mouth two (2) times a day. 08/28/09   Phys Other, MD   diazepam (VALIUM) 5 mg tablet Take 5 mg by mouth every four (4) hours. 08/28/09  Phys Other, MD   azelastine (ASTELIN) 137 mcg nasal spray 1 Spray by Nasal route two (2) times a day. Administer to     Phys Other, MD   butalbital-acetaminophen-caffeine (FIORICET) 50-325-40 mg per tablet Take 2 Tabs by mouth every four (4) hours as needed. 08/28/09   Phys Other, MD   prednisoLONE acetate (PRED FORTE) 1 % ophthalmic suspension Administer 1 Drop to right eye daily. 08/28/09   Phys Other, MD   ESZOPICLONE (LUNESTA PO) Take  by mouth. Unknown dosage     Phys Other, MD       REVIEW OF SYSTEMS:    General: negative for fever, chills, sweats, positive weakness  Eyes: negative for blurred vision, eye pain,  Diplopia, has chronic loss of vision  Ear Nose and Throat: negative for rhinorrhea, pharyngitis, speech or swallowing difficulties  Respiratory:  negative for cough, sputum production,  wheezing  Cardiology:  negative for  orthopnea, PND, edema, syncope   Gastrointestinal: negative for abdominal pain, N/V, dysphagia, change in bowel habits, bleeding  Genitourinary: negative for frequency, urgency, occ dysuria, ? hematuria, positive incontinence  Muskuloskeletal: positive for arthralgia, myalgia esp knees but also legs  Hematology: positive for easy bruising, bleeding, no lymphadenopathy  Dermatological: negative for rash, ulceration, new lesion  Neurological: positive for headache, dizziness, paresthesias, but no focal weakness, memory loss, gait disturbance  Psychological: positive for anxiety, depression    Objective:   VITALS:    Visit Vitals   Item Reading   ??? BP 145/82   ??? Pulse 86   ??? Resp 16   ??? Ht 5\' 8"  (1.727 m)   ??? Wt 200 lb   ??? BMI 30.41 kg/m2   ??? SpO2 99%     PHYSICAL EXAM:     GENERAL:     WD y   WN y   Cachectic    Thin    Obese    Disheveled n   Ill Appearing Critically n   Ill Appearing Chronically y   Acute Distress n   Other      HEENT:    NC/AT/EOMI y   PERRLA y   Conjunctivae Pink y   Conjunctivae Pale    Moist Mucosa y   Dry Mucosa    Hearing intact to voice y   Other      NECK:    Supple y   Masses n   Thyroid Tender n   Other                   RESPIRATORY:    CTA bilaterally WITHOUT wheezing/rhonchi/rales or crackles y   Wheezing    Rhonchi    Crackles    Use of accessory muscles n   Other      CARDIAC:    regular rate and rhythm No murmurs/rubs/gallops y   Murmur n   Rubs n   Gallops n   Rate Regular/Irregular n   Carotid Bruit Left/Right    Lower Extremity Edema n   JVP  n   Other      ABDOMEN:    soft/non distended non tender +bowel sounds no HSM y   Rigid n   Tenderness n   Hepatomegaly n   Splenomegaly n   Distended n   Normal/Hyper/Hypo Active Bowel Sounds active   Other      SKIN / MUSCULOSKELETAL:    Rashes n   Ecchymosis Few scattered   Ulcers n   Tight to palpitation  Turgor Good/Poor god   Cyanosis/Clubbing Feet cold   Amputation(s) n   Other      NEUROLOGY:    cranial nerves II-XII grossly intact y   Cranial Nerve Deficit n   Facial Droop n   Slurred Speech n   Aphasia n   Strength Normal y   Weakness n   Meningismus/Kernig's Sign/ Merchandiser, retail y   Other      PSYCHIATRIC:    AAOx3  y   Insight Poor    Insight Good y   Alert and Oriented to Person     Alert and Oriented to Place    Alert and Oriented toTime    Depressed n   Anxious n   Agitated n   Lethargic n   Stuporous n   Sedated n   Other      ________________________________________________________________________  Care Plan discussed with:    Comments   Patient y    Family  y Sister and dtr   RN y    Comptroller:      ________________________________________________________________________  Prophylaxis:  GI    DVT lovenox    ________________________________________________________________________  Recommended Disposition:   Home with Family y   HH/PT/OT/RN    SNF/LTC    [facility]    ________________________________________________________________________  Code Status:  Full Code y   DNR/DNI    ________________________________________________________________________  TOTAL TIME: 65    Comments   >50% of visit spent in counseling and coordination of care       Critical Care Provided     Minutes non procedure based  ________________________________________________________________________  Cheryln Manly, MD      Procedures: see electronic medical records for all procedures/Xrays and details which were not copied into this note but were reviewed prior to creation of Plan.    LAB DATA REVIEWED:    Recent Results (from the past 24 hour(s))   CBC WITH AUTOMATED DIFF    Collection Time    10/03/10 10:15 PM       Component Value Range    WBC 9.4  3.6 - 11.0 (K/uL)    RBC 3.96  3.80 - 5.20 (M/uL)    HGB 13.1  11.5 - 16.0 (g/dL)    HCT 16.1  09.6 - 04.5 (%)    MCV 91.7  80.0 - 99.0 (FL)    MCH 33.1  26.0 - 34.0 (PG)    MCHC 36.1  30.0 - 36.5 (g/dL)    RDW 40.9  81.1 - 91.4 (%)    PLATELET 250  150 - 400 (K/uL)    NEUTROPHILS 67  32 - 75 (%)    LYMPHOCYTES 27  12 - 49 (%)    MONOCYTES 6  5 - 13 (%)    EOSINOPHILS 0  0 - 7 (%)    BASOPHILS 0  0 - 1 (%)    ABSOLUTE NEUTS 6.3  1.8 - 8.0 (K/UL)    ABSOLUTE LYMPHS 2.5  0.8 - 3.5 (K/UL)    ABSOLUTE MONOS 0.6  0.0 - 1.0 (K/UL)    ABSOLUTE EOSINS 0.0  0.0 - 0.4 (K/UL)    ABSOLUTE BASOS 0.0  0.0 - 0.1 (K/UL)   METABOLIC PANEL, COMPREHENSIVE    Collection Time    10/03/10 10:15 PM       Component Value Range    Sodium  136  136 - 145 (MMOL/L)    Potassium 3.2 (*) 3.5 - 5.1 (MMOL/L)    Chloride 101  97 - 108 (MMOL/L)    CO2 26  21 - 32 (MMOL/L)    Anion gap 9  5 - 15 (mmol/L)    Glucose 102 (*) 65 - 100 (MG/DL)    BUN 13  6 - 20 (MG/DL)    Creatinine 0.9  0.6 - 1.3 (MG/DL)    BUN/Creatinine ratio 14  12 - 20 ( )     GFR est AA >60  >60 (ml/min/1.36m2)    GFR est non-AA >60  >60 (ml/min/1.37m2)    Calcium 9.0  8.5 - 10.1 (MG/DL)    Bilirubin, total 0.3  0.2 - 1.0 (MG/DL)    ALT 39  12 - 78 (U/L)    AST 28  15 - 37 (U/L)    Alk. phosphatase 97  50 - 136 (U/L)    Protein, total 6.8  6.4 - 8.2 (g/dL)    Albumin 3.6  3.5 - 5.0 (g/dL)    Globulin 3.2  2.0 - 4.0 (g/dL)    A-G Ratio 1.1  1.1 - 2.2 ( )   TROPONIN I    Collection Time    10/03/10 10:15 PM       Component Value Range    Troponin-I, Qt. <0.04  <0.05 (ng/mL)   CK W/ REFLX CKMB    Collection Time    10/03/10 10:15 PM       Component Value Range    CK 34  26 - 192 (U/L)   PTT    Collection Time    10/03/10 10:15 PM       Component Value Range    aPTT 24.7  24.0 - 31.5 (sec)    aPTT, therapeutic range      58.0 - 77.0 (SECS)   PROTHROMBIN TIME    Collection Time    10/03/10 10:15 PM       Component Value Range    INR 1.0  0.9 - 1.1 ( )    Prothrombin Time-PT 10.0  9.0 - 11.0 (sec)   MAGNESIUM    Collection Time    10/03/10 10:15 PM       Component Value Range    Magnesium 2.2  1.6 - 2.4 (MG/DL)   PRO-BNP    Collection Time    10/03/10 10:15 PM       Component Value Range    NT pro-BNP 25  0 - 125 (PG/ML)   URINALYSIS W/MICROSCOPIC    Collection Time    10/03/10 11:50 PM       Component Value Range    Color YELLOW      Appearance CLEAR      Specific gravity 1.007  1.003 - 1.030 ( )    pH 7.5  5.0 - 8.0 ( )    Protein NEGATIVE   NEGATIVE (MG/DL)    Glucose NEGATIVE   NEGATIVE (MG/DL)    Ketone NEGATIVE   NEGATIVE (MG/DL)    Bilirubin NEGATIVE   NEGATIVE     Blood NEGATIVE   NEGATIVE     Urobilinogen 0.2  0.2 - 1.0 (EU/DL)    Nitrites NEGATIVE   NEGATIVE     Leukocyte Esterase MODERATE (*) NEGATIVE     WBC 5-10  0 - 4 (/HPF)    RBC 0-3  0 - 5 (/HPF)    Epithelial cells 0-5  0 - 5 (/LPF)  Bacteria NEGATIVE   NEGATIVE (/HPF)    Hyaline Cast 0-2  0 - 2

## 2010-10-04 NOTE — ED Notes (Signed)
Pt placed in hospital bed for comfort. Pt provided with warm blanket and pillow for comfort

## 2010-10-04 NOTE — ED Notes (Signed)
Patient & daughter resting quietly

## 2010-10-04 NOTE — ED Notes (Signed)
Care assumed of patient @ this time & intro with rounding done, patient resting quietly on stretcher without verbal complaints.

## 2010-10-04 NOTE — ED Notes (Signed)
Any medications that were not given is due to not having them from pharmacy and patient being discharged .   Patient is in stable condition she has been given discharge instructions by MD and has verbalized understanding.

## 2010-10-05 LAB — CULTURE, URINE
Colonies Counted: 1000
Colony Count: 1000

## 2010-11-07 MED ORDER — SODIUM CHLORIDE 0.9 % IJ SYRG
Freq: Once | INTRAMUSCULAR | Status: AC
Start: 2010-11-07 — End: 2010-11-07
  Administered 2010-11-07: 20:00:00 via INTRAVENOUS

## 2010-11-07 MED ORDER — IOVERSOL 350 MG/ML IV SOLN
350 mg iodine/mL | Freq: Once | INTRAVENOUS | Status: AC
Start: 2010-11-07 — End: 2010-11-07
  Administered 2010-11-07: 20:00:00 via INTRAVENOUS

## 2010-11-07 MED ORDER — BARIUM SULFATE 2 % ORAL SUSP
2.1 % (w/v), 2.0 % (w/w) | Freq: Once | ORAL | Status: AC
Start: 2010-11-07 — End: 2010-11-07
  Administered 2010-11-07: 20:00:00 via ORAL

## 2010-11-07 MED ADMIN — 0.9% sodium chloride infusion: INTRAVENOUS | @ 20:00:00 | NDC 00409798309

## 2011-09-29 ENCOUNTER — Encounter

## 2011-10-01 LAB — CREATININE, POC
Creatinine (POC): 0.7 MG/DL (ref 0.6–1.3)
GFRAA, POC: >60 mL/min/{1.73_m2} (ref >60)
GFRNA, POC: >60 mL/min/{1.73_m2} (ref >60)

## 2011-10-01 MED ORDER — BARIUM SULFATE 2 % ORAL SUSP
2.1 % (w/v), 2.0 % (w/w) | Freq: Once | ORAL | Status: AC
Start: 2011-10-01 — End: 2011-10-01
  Administered 2011-10-01: 19:00:00 via ORAL

## 2011-10-01 MED ORDER — IOVERSOL 350 MG/ML IV SOLN
350 mg iodine/mL | Freq: Once | INTRAVENOUS | Status: AC
Start: 2011-10-01 — End: 2011-10-01
  Administered 2011-10-01: 20:00:00 via INTRAVENOUS

## 2011-10-01 MED ORDER — SODIUM CHLORIDE 0.9 % IJ SYRG
Freq: Once | INTRAMUSCULAR | Status: AC
Start: 2011-10-01 — End: 2011-10-01
  Administered 2011-10-01: 20:00:00 via INTRAVENOUS

## 2011-10-01 MED ADMIN — 0.9% sodium chloride infusion: INTRAVENOUS | @ 20:00:00 | NDC 00409798309

## 2011-10-01 MED FILL — OPTIRAY 350 MG IODINE/ML INTRAVENOUS SOLUTION: 350 mg iodine/mL | INTRAVENOUS | Qty: 100

## 2011-10-01 MED FILL — SALINE FLUSH INJECTION SYRINGE: INTRAMUSCULAR | Qty: 10

## 2011-10-01 MED FILL — READI-CAT 2  2.1 % (W/V), 2.0 % (W/W) ORAL SUSPENSION: 2.1 % (w/v), 2.0 % (w/w) | ORAL | Qty: 900

## 2011-10-01 MED FILL — SODIUM CHLORIDE 0.9 % IV: INTRAVENOUS | Qty: 1000

## 2011-11-12 ENCOUNTER — Encounter

## 2012-01-08 ENCOUNTER — Encounter

## 2012-01-09 ENCOUNTER — Encounter

## 2012-01-09 LAB — CREATININE, POC
Creatinine (POC): 0.9 MG/DL (ref 0.6–1.3)
GFRAA, POC: >60 mL/min/{1.73_m2} (ref >60)
GFRNA, POC: >60 mL/min/{1.73_m2} (ref >60)

## 2012-01-09 MED ORDER — BARIUM SULFATE 2 % ORAL SUSP
2.1 % (w/v), 2.0 % (w/w) | Freq: Once | ORAL | Status: AC
Start: 2012-01-09 — End: 2012-01-09
  Administered 2012-01-09: 19:00:00 via ORAL

## 2012-01-09 MED ORDER — SODIUM CHLORIDE 0.9 % IJ SYRG
Freq: Once | INTRAMUSCULAR | Status: AC
Start: 2012-01-09 — End: 2012-01-09
  Administered 2012-01-09: 19:00:00 via INTRAVENOUS

## 2012-01-09 MED ORDER — IOVERSOL 350 MG/ML IV SOLN
350 mg iodine/mL | Freq: Once | INTRAVENOUS | Status: AC
Start: 2012-01-09 — End: 2012-01-09
  Administered 2012-01-09: 19:00:00 via INTRAVENOUS

## 2012-01-09 MED ADMIN — 0.9% sodium chloride infusion: INTRAVENOUS | @ 19:00:00 | NDC 00409798309

## 2012-01-09 MED FILL — SODIUM CHLORIDE 0.9 % IV: INTRAVENOUS | Qty: 1000

## 2012-01-21 ENCOUNTER — Encounter

## 2012-06-14 ENCOUNTER — Encounter

## 2012-06-16 NOTE — Patient Instructions (Signed)
MyChart Activation    Thank you for requesting access to MyChart. Please follow the instructions below to securely access and download your online medical record. MyChart allows you to send messages to your doctor, view your test results, renew your prescriptions, schedule appointments, and more.    How Do I Sign Up?    1. In your internet browser, go to [email or web address]  2. Click on the First Time User? Click Here link in the Sign In box. You will be redirect to the New Member Sign Up page.  3. Enter your MyChart Access Code exactly as it appears below. You will not need to use this code after you???ve completed the sign-up process. If you do not sign up before the expiration date, you must request a new code.    MyChart Access Code: [email or web address]  Expires: 09/14/2012  9:07 AM (This is the date your MyChart access code will expire)    4. Enter the last four digits of your Social Security Number (xxxx) and Date of Birth (mm/dd/yyyy) as indicated and click Submit. You will be taken to the next sign-up page.  5. Create a MyChart ID. This will be your MyChart login ID and cannot be changed, so think of one that is secure and easy to remember.  6. Create a MyChart password. You can change your password at any time.  7. Enter your Password Reset Question and Answer. This can be used at a later time if you forget your password.   8. Enter your e-mail address. You will receive e-mail notification when new information is available in MyChart.  9. Click Sign Up. You can now view and download portions of your medical record.  10. Click the Download Summary menu link to download a portable copy of your medical information.    Additional Information    If you have questions, please visit the Frequently Asked Questions section of the MyChart website at [email or web address] Remember, MyChart is NOT to be used for urgent needs. For medical emergencies, dial 911.

## 2012-06-16 NOTE — Progress Notes (Signed)
The patient is a 56 y.o. woman referred by Tillman Sers, NP regarding bilateral foot symptoms.  The patient reports that for about two years she has had extreme sensitivity of both feet to light in that exposure to sunlight causes a very uncomfortable burning sensation in the feet and they will sometimes turn a bright red.  At other times, when there is no sun exposure, and in general, both feet feel very cold.   She also has noted a gradual darkening of the skin color of both feet being worse on the left than the right and this discoloration seems to be spreading into the lower leg.  She says she bruises easily in the lower legs and if she gets a cut it takes a while for the bleeding to stop.      She did smoke until one month ago.  She is planning to quit permanently.  She has no history of deep vein thrombosis.  She does not use alcohol.    The patient reports that with respect to her legs she can walk well, but she has very poor balance related to Gentamycin and often has to have assistance to avoid falling.    The patient has a history of lupus, chronic pain, depression, arthritis, headache, Meniere's disease and fibromyalgia.  She takes multiple medications related to these conditions as noted in the medication list.      The patient denies any specific history of heart disease, anginal chest pain, irregular heart beat, shortness of breath, productive cough except when she has an acute upper respiratory illness which she is just getting over.      She has had no history of any vascular intervention.  She does have a history of peripheral neuropathy.  There is also a history of migraine.    On examination the patient is an alert woman in no acute distress.  She does seem somewhat anxious.  HEAD/NECK:  No jaundice or mass.   LUNGS:  Clear bilaterally.   HEART:  Regular without murmur.  ABDOMEN: Soft.  EXTREMITIES:  Lower extremity examination revealed 2+ posterior tibial pulses bilaterally.  On the right side,  there is a 1+ dorsalis pedis pulse.  There is generalized plethora of the skin of the right foot.  This blanches to compression with slightly delayed refilling.  There are prominent spider veins scattered in the right foot and the lower leg on the right.  There is no significant edema of right lower extremity or foot.  There are no ulcerations on right lower extremity or foot.  Sensation to touch is intact in all toes.  There is normal movement of the right foot and lower leg.  The calf is soft and non-tender.    On the left side, there is a 2+ posterior tibial pulse.  There is a 1+ dorsalis pedis pulse.  There is a generalized plethora of the skin of the left foot and into the distal lower leg.  This blanches with slightly delayed refill.  There are prominent spider veins on the foot and lower leg on the left.  There is no edema of lower leg or foot on the left.  No ulcerations are present on the left.  Sensation is intact to light touch in the toes. There are some skin changes in the left foot as well as in the right which suggests something of a plaque formation on the skin.      To clinical exam, the patient appears to have adequate  flow of blood into both feet.  She has prominent spider veins which may reflect some degree of limited venous insufficiency. I do not see large varicosities suggesting large vessel venous insufficiency.    I will arrange for the patient to have noninvasive vascular lab arterial testing and venous testing in both lower extremities.    I explained to the patient that it appears that her global arterial flow into both feet is normal by clinical exam.  The skin coloration is likely related to cutaneous vasodilation.  The cause of this is  not clear.  In most cases this plethora takes a benign course and is not associated with skin ulcer or limb loss.  The symptoms of burning which she describes sounds more of neuropathic origin.    I have asked the patient to call the office a few days  after her noninvasive vascular lab testing is done so I can discuss those results with her and offer any final recommendations.  At the present time, I do not think that any vascular intervention would be needed or would be helpful.    Final Diagnosis:  Bilateral foot pain and discoloration, spider veins.    cc: Tillman Sers, NP    MedDATA/gwo

## 2012-06-21 NOTE — Progress Notes (Signed)
[facility] Vascular Lab Technologist Preliminary Report:  Venous Duplex Leg    Bilateral leg venous duplex was performed.    All deep and superficial veins appear compressible with normal Doppler characteristics.    Final report to follow.

## 2012-06-29 NOTE — Procedures (Signed)
[facility]  *** FINAL REPORT ***    Name: Katherine [name], Katherine [name]  MRN: [id number]  DOB: 07/18/1956  Visit: [id number]  Date: 21 Jun 2012    TYPE OF TEST: Peripheral Venous Testing    REASON FOR TEST  Venous Stasis    Right Leg:-  Deep venous thrombosis:           No  Superficial venous thrombosis:    No  Deep venous insufficiency:        No  Superficial venous insufficiency: No    Left Leg:-  Deep venous thrombosis:           No  Superficial venous thrombosis:    No  Deep venous insufficiency:        No  Superficial venous insufficiency: No      INTERPRETATION/FINDINGS  Right leg :  1. Deep vein(s) visualized include the common femoral, proximal  femoral, mid femoral, distal femoral, popliteal(above knee),  popliteal(fossa), popliteal(below knee), posterior tibial and peroneal   veins.  2. No evidence of deep venous thrombosis detected in the veins  visualized.  3. Superficial vein(s) visualized include the great saphenous vein.  4. No evidence of superficial thrombosis detected.  Left leg :  1. Deep vein(s) visualized include the common femoral, proximal  femoral, mid femoral, distal femoral, popliteal(above knee),  popliteal(fossa), popliteal(below knee), posterior tibial and peroneal   veins.  2. No evidence of deep venous thrombosis detected in the veins  visualized.  3. Superficial vein(s) visualized include the great saphenous vein.  4. No evidence of superficial thrombosis detected.    ADDITIONAL COMMENTS    Unable to visualize bilateral lesser saphenous veins.    I have personally reviewed the data relevant to the interpretation of  this  study.    TECHNOLOGIST:  Alfredia Client, RVT    PHYSICIAN:  Electronically signed by:  Otho Darner. Alvester Morin, MD  06/29/2012 03:30 PM

## 2012-06-29 NOTE — Procedures (Signed)
[facility]  *** FINAL REPORT ***    Name: Katherine [name], Katherine [name]  MRN: [id number]  DOB: 28 Oct 1955  Visit: [id number]  Date: 21 Jun 2012    TYPE OF TEST: Peripheral Venous Testing    REASON FOR TEST  Venous Stasis    Right Leg:-  Deep venous thrombosis:           No  Superficial venous thrombosis:    No  Deep venous insufficiency:        No  Superficial venous insufficiency: No    Left Leg:-  Deep venous thrombosis:           No  Superficial venous thrombosis:    No  Deep venous insufficiency:        No  Superficial venous insufficiency: No      INTERPRETATION/FINDINGS  Right leg :  1. Deep vein(s) visualized include the common femoral, proximal  femoral, mid femoral, distal femoral, popliteal(above knee),  popliteal(fossa), popliteal(below knee), posterior tibial and peroneal   veins.  2. No evidence of deep venous thrombosis detected in the veins  visualized.  3. Superficial vein(s) visualized include the great saphenous vein.  4. No evidence of superficial thrombosis detected.  Left leg :  1. Deep vein(s) visualized include the common femoral, proximal  femoral, mid femoral, distal femoral, popliteal(above knee),  popliteal(fossa), popliteal(below knee), posterior tibial and peroneal   veins.  2. No evidence of deep venous thrombosis detected in the veins  visualized.  3. Superficial vein(s) visualized include the great saphenous vein.  4. No evidence of superficial thrombosis detected.    ADDITIONAL COMMENTS    Unable to visualize bilateral lesser saphenous veins.    I have personally reviewed the data relevant to the interpretation of  this  study.    TECHNOLOGIST:  Wesley Smith, RVT    PHYSICIAN:  Electronically signed by:  Kamrynn Melott B. Ivery Michalski, MD  06/29/2012 03:30 PM

## 2012-07-29 NOTE — Telephone Encounter (Signed)
Surgery    Patient had venous dopplers of legs which were normal.      I reviewed results with her.    She cancelled her PVR arterial testing but will reschedule.  I asked her to call my office a few days aftet this exam to discuss results.    Warnell Forester, MD

## 2012-08-24 ENCOUNTER — Encounter

## 2012-12-21 NOTE — Progress Notes (Signed)
12/21/2012      Name:  Katherine [name]  DOB:  09/01/1955  MRN:  [id number]     PCP:  Janit Pagan, NP    Chief Complaint   Patient presents with   ??? New Patient     "legs, arms & hands shake, and trouble walking" / migraines         HISTORY OF PRESENT ILLNESS:  Katherine [name] presents today for multiple complaints.  She has seen Dr. Katrinka Blazing in the past but can't remember when but has been several years ago. She thought it was just recently that she saw him and is confused by the fact that it has been so many years. Seen by Dr. Katrinka Blazing for HA but was also seeing another neurologist at the same time.  She c/o shaking - when she wakes in the morning her legs and hands shake.  She states she had an EMG/NCV that didn't show anything.  Dr. Katrinka Blazing prescribed her something but not sure what it was - just a small pill is all she knows.  She doesn't know why she didn't come back.  She had Lupus & Meniere's disease as well. Followed by Neurologist at [facility] for Meniere's disease - operated on both ears, placement of a shunt with little disease.  She is a very poor historian and difficult to ascertain true reason for visit and why she hasn't been treated in over several years.  Shaking mainly morning or night. Mainly when trying to hold things.  She states wrist and fingers go numb on both hands - mainly tips of fingers. She states having a memory problems - forgets things that she should remember.  She writes things down and she still forget things.  When she walks her feet go totally numb - bottom feet mainly and feels like on fire.  Has discoloration of feet bilaterally - was seen by Vascular doctor but now she cant remember his name ( I found it was Dr. Alvester Morin and gave her his number) Patient has visual issues - will only look straight ahead - has walked in front of car and off a curb.    Review of Systems - History obtained from the patient  General ROS: positive for  - fatigue, hot flashes and weight loss  Psychological ROS: positive  for - anxiety and depression  ENT ROS: positive for - headaches, vertigo and Meniere's disease  Hematological and Lymphatic ROS: negative  Endocrine ROS: positive for - malaise/lethargy  Respiratory ROS: no cough, shortness of breath, or wheezing  Cardiovascular ROS: no chest pain or dyspnea on exertion  Gastrointestinal ROS: no abdominal pain, change in bowel habits, or black or bloody stools  positive for - Hiatal Hernia  Genito-Urinary ROS: positive for - bladder prolapse with surgery x 2  Musculoskeletal ROS: positive for - gait disturbance, joint pain, joint stiffness and Knee operation, 2 knee injections  Neurological ROS: no TIA or stroke symptoms  positive for - behavioral changes, confusion, gait disturbance, headaches, impaired coordination/balance, memory loss and numbness/tingling  Dermatological ROS: negative     Current Outpatient Prescriptions   Medication Sig   ??? primidone (MYSOLINE) 50 mg tablet Take 1 Tab by mouth three (3) times daily.   ??? topiramate (TOPAMAX) 25 mg tablet Take 100 mg by mouth nightly as needed.   ??? topiramate (TOPAMAX) 200 mg tablet Take 200 mg by mouth every morning.   ??? Cetirizine (ZYRTEC) 10 mg Cap Take 10 mg by mouth.   ???  zolpidem (AMBIEN) 10 mg tablet Take 10 mg by mouth nightly as needed.   ??? ketorolac (TORADOL) 10 mg tablet Take 10 mg by mouth.   ??? hydroxychloroquine (PLAQUENIL) 200 mg tablet Take 200 mg by mouth daily.   ??? meloxicam (MOBIC) 15 mg tablet Take 15 mg by mouth daily.   ??? carisoprodol (SOMA) 350 mg tablet Take 350 mg by mouth four (4) times daily.   ??? butalbital-acetaminophen-caffeine (FIORICET) 50-325-40 mg per tablet Take 1-2 Tabs by mouth every four (4) hours as needed for Pain.   ??? prochlorperazine (COMPAZINE) 10 mg tablet Take 1 Tab by mouth every eight (8) hours as needed for Nausea.   ??? potassium chloride SR (KLOR-CON 10) 10 mEq tablet Take 1 Tab by mouth daily.   ??? gabapentin (NEURONTIN) 300 mg capsule Take 600 mg by mouth three (3) times daily.   ???  valacyclovir (VALTREX) 1 g tablet Take 1,000 mg by mouth daily.   ??? duloxetine (CYMBALTA) 60 mg capsule Take 60 mg by mouth two (2) times a day.   ??? triamterene-hydrochlorothiazide (DYAZIDE) 37.5-25 mg per capsule Take 1 Cap by mouth daily.   ??? hydrocodone-acetaminophen (LORTAB) 5-500 mg per tablet Take 1 Tab by mouth every eight (8) hours as needed.   ??? ergocalciferol (VITAMIN D) 50,000 unit capsule Take 50,000 Units by mouth every seven (7) days.   ??? cyclobenzaprine (FLEXERIL) 10 mg tablet Take 10 mg by mouth two (2) times a day.   ??? diazepam (VALIUM) 5 mg tablet Take 5 mg by mouth every four (4) hours.   ??? azelastine (ASTELIN) 137 mcg nasal spray 1 Spray by Nasal route two (2) times a day. Administer to right and left nostril.   ??? butalbital-acetaminophen-caffeine (FIORICET) 50-325-40 mg per tablet Take 2 Tabs by mouth every four (4) hours as needed.   ??? prednisoLONE acetate (PRED FORTE) 1 % ophthalmic suspension Administer 1 Drop to right eye daily.   ??? ESZOPICLONE (LUNESTA PO) Take  by mouth. Unknown dosage      No current facility-administered medications for this visit.     Allergies   Allergen Reactions   ??? Morphine Other (comments)     "it does not stop the pain"   ??? Other Medication Other (comments)     Pt must have low sodium fluid because it will cause an attack of the mineres   ??? Pcn (Penicillins) Rash and Swelling     Past Medical History   Diagnosis Date   ??? Other ill-defined conditions      shingles, lupus, menieres, Fibromyalgia   ??? Autoimmune disease    ??? Chronic pain    ??? Depression    ??? Arthritis    ??? Headache    ??? Burning with urination    ??? Bowel disease    ??? Sweats, menopausal    ??? Night sweats    ??? Visual loss    ??? Hearing loss    ??? Diarrhea    ??? Dizziness    ??? Numbness    ??? Easy bruising      Past Surgical History   Procedure Laterality Date   ??? Hx gyn       hysterectomy   ??? Hx orthopaedic       back x2, right knee   ??? Hx urological       bladder surgery   ??? Hx transplant       right corona    ??? Hx heent  right eye corona transplant, both ears, tonsillectomy   ??? Hx cholecystectomy     ??? Hx appendectomy     ??? Pr abdomen surgery proc unlisted       1 foot of colon removed   ??? Hx hernia repair       History     Social History   ??? Marital Status: WIDOWED     Spouse Name: N/A     Number of Children: N/A   ??? Years of Education: N/A     Occupational History   ??? Not on file.     Social History Main Topics   ??? Smoking status: Never Smoker    ??? Smokeless tobacco: Not on file   ??? Alcohol Use: No   ??? Drug Use: Not on file   ??? Sexually Active: Not on file     Other Topics Concern   ??? Not on file     Social History Narrative   ??? No narrative on file     Family History   Problem Relation Age of Onset   ??? Heart Disease Mother    ??? Cancer Mother      breast cancer   ??? Hypertension Father    ??? Diabetes Father    ??? Heart Disease Sister        PHYSICAL EXAMINATION:    Visit Vitals   Item Reading   ??? BP 118/78   ??? Pulse 111   ??? Ht 5\' 7"  (1.702 m)   ??? Wt 219 lb (99.338 kg)   ??? BMI 34.29 kg/m2   ??? SpO2 96%     General:  Well defined, nourished, and groomed individual in no acute distress.    Neck: Supple, nontender,  no JVD, no bruits, no pain with resistance to active range of motion.    Heart: Regular rate and rhythm, no murmurs, rub, or gallop.  Normal S1S2.  Lungs:  Clear to auscultation bilaterally with equal chest expansion, no cough, no wheeze  Musculoskeletal:  Extremities revealed no edema and had full range of motion of joints. Discoloration and cold temperature to LE bilaterally   Psych:  Good mood and bright affect    NEUROLOGICAL EXAMINATION:     Mental Status:   Alert and oriented to person, place, and time.   Speech is fluent with a full fund of knowledge.      Cranial Nerves:    II, III, IV, VI:  Visual acuity grossly intact. Visual fields are normal.    Pupils are equal, round, and reactive to light and accommodation.    Extra-ocular movements are full and fluid.  Fundoscopic exam was benign, no ptosis or  nystagmus.   V-XII: Hearing is grossly intact.  Facial features are symmetric, with normal sensation and strength.  The palate rises symmetrically and the tongue protrudes midline.  Sternocleidomastoids 4/5.      Motor Examination: Normal tone, bulk, and strength, 4/5 muscle strength throughout.  No cogwheel rigidity or clonus present.      Sensory exam:  Normal throughout to LT, temperature, and vibration sense in UE. Absent temperature sense in LE bilaterally  Normal proprioception.      Coordination:    Finger to nose and rapid arm movement testing was slowed and deliberate.   No resting or intention tremor    Gait and Station:  Unsteady while walking - slowed but not shuffled, straight, slight stumbling with turn - .  Normal arm swing.  No Rhomberg or  pronator drift.   No muscle wasting or fasiculations noted.      Reflexes:  DTRs 2+ throughout.       ASSESSMENT AND PLAN    ICD-9-CM    1. Lupus 710.0 CBC WITH AUTOMATED DIFF     METABOLIC PANEL, COMPREHENSIVE     SED RATE (ESR)     TSH, 3RD GENERATION     VITAMIN B12 & FOLATE     HEMOGLOBIN A1C     PR EMG, NEEDLE, TWO LIMBS   2. Burning sensation of feet 782.0 CBC WITH AUTOMATED DIFF     METABOLIC PANEL, COMPREHENSIVE     SED RATE (ESR)     TSH, 3RD GENERATION     VITAMIN B12 & FOLATE     HEMOGLOBIN A1C     PR EMG, NEEDLE, TWO LIMBS   3. 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 Azucena Cecil, FNP-BC  12/21/2012  10:30 AM

## 2012-12-22 LAB — CBC WITH AUTOMATED DIFF
ABS. BASOPHILS: 0 10*3/uL (ref 0.0–0.2)
ABS. EOSINOPHILS: 0 10*3/uL (ref 0.0–0.4)
ABS. IMM. GRANS.: 0 10*3/uL (ref 0.0–0.1)
ABS. MONOCYTES: 0.3 10*3/uL (ref 0.1–0.9)
ABS. NEUTROPHILS: 6.1 10*3/uL (ref 1.4–7.0)
Abs Lymphocytes: 1.5 10*3/uL (ref 0.7–3.1)
BASOPHILS: 0 % (ref 0–3)
EOSINOPHILS: 0 % (ref 0–5)
HCT: 41.7 % (ref 34.0–46.6)
HGB: 14.2 g/dL (ref 11.1–15.9)
IMMATURE GRANULOCYTES: 0 % (ref 0–2)
Lymphocytes: 18 % (ref 14–46)
MCH: 33.7 pg — ABNORMAL HIGH (ref 26.6–33.0)
MCHC: 34.1 g/dL (ref 31.5–35.7)
MCV: 99 fL — ABNORMAL HIGH (ref 79–97)
MONOCYTES: 4 % (ref 4–12)
NEUTROPHILS: 78 % — ABNORMAL HIGH (ref 40–74)
PLATELET: 274 10*3/uL (ref 155–379)
RBC: 4.21 x10E6/uL (ref 3.77–5.28)
RDW: 13.4 % (ref 12.3–15.4)
WBC: 7.9 10*3/uL (ref 3.4–10.8)

## 2012-12-22 LAB — VITAMIN B12 & FOLATE
Folate: >19.9 ng/mL (ref >3.0)
Vitamin B12: 674 pg/mL (ref 211–946)

## 2012-12-22 LAB — METABOLIC PANEL, COMPREHENSIVE
A-G Ratio: 1.9 (ref 1.1–2.5)
ALT (SGPT): 12 IU/L (ref 0–32)
AST (SGOT): 45 IU/L — ABNORMAL HIGH (ref 0–40)
Albumin: 4.7 g/dL (ref 3.5–5.5)
Alk. phosphatase: 73 IU/L (ref 39–117)
BUN/Creatinine ratio: 16 (ref 9–23)
BUN: 13 mg/dL (ref 6–24)
Bilirubin, total: 0.3 mg/dL (ref 0.0–1.2)
CO2: 23 mmol/L (ref 19–28)
Calcium: 9.6 mg/dL (ref 8.7–10.2)
Chloride: 103 mmol/L (ref 97–108)
Creatinine: 0.8 mg/dL (ref 0.57–1.00)
GFR est AA: 93 mL/min/{1.73_m2} (ref >59)
GFR est non-AA: 81 mL/min/{1.73_m2} (ref >59)
GLOBULIN, TOTAL: 2.5 g/dL (ref 1.5–4.5)
Glucose: 88 mg/dL (ref 65–99)
Potassium: 4 mmol/L (ref 3.5–5.2)
Protein, total: 7.2 g/dL (ref 6.0–8.5)
Sodium: 139 mmol/L (ref 134–144)

## 2012-12-22 LAB — HEMOGLOBIN A1C WITH EAG: Hemoglobin A1c: 5.4 % (ref 4.8–5.6)

## 2012-12-22 LAB — SED RATE (ESR): Sed rate (ESR): 2 mm/hr (ref 0–40)

## 2012-12-22 LAB — TSH 3RD GENERATION: TSH: 0.788 u[IU]/mL (ref 0.450–4.500)

## 2013-01-20 ENCOUNTER — Encounter

## 2013-01-20 NOTE — Telephone Encounter (Signed)
Patients pharmacy sent fax request for 90 day supply of Primidone 50 mg tablet TID

## 2013-01-28 LAB — CBC WITH AUTOMATED DIFF
ABS. BASOPHILS: 0 10*3/uL (ref 0.0–0.1)
ABS. EOSINOPHILS: 0 10*3/uL (ref 0.0–0.4)
ABS. LYMPHOCYTES: 1.8 10*3/uL (ref 0.8–3.5)
ABS. MONOCYTES: 0.4 10*3/uL (ref 0.0–1.0)
ABS. NEUTROPHILS: 2.8 10*3/uL (ref 1.8–8.0)
BASOPHILS: 0 % (ref 0–1)
EOSINOPHILS: 1 % (ref 0–7)
HCT: 41 % (ref 35.0–47.0)
HGB: 14.3 g/dL (ref 11.5–16.0)
LYMPHOCYTES: 35 % (ref 12–49)
MCH: 33.3 PG (ref 26.0–34.0)
MCHC: 34.9 g/dL (ref 30.0–36.5)
MCV: 95.6 FL (ref 80.0–99.0)
MONOCYTES: 9 % (ref 5–13)
NEUTROPHILS: 55 % (ref 32–75)
PLATELET: 276 10*3/uL (ref 150–400)
RBC: 4.29 M/uL (ref 3.80–5.20)
RDW: 13.1 % (ref 11.5–14.5)
WBC: 5.1 10*3/uL (ref 3.6–11.0)

## 2013-01-28 LAB — METABOLIC PANEL, COMPREHENSIVE
A-G Ratio: 1.1 (ref 1.1–2.2)
ALT (SGPT): 30 U/L (ref 12–78)
AST (SGOT): 50 U/L — ABNORMAL HIGH (ref 15–37)
Albumin: 3.9 g/dL (ref 3.5–5.0)
Alk. phosphatase: 70 U/L (ref 45–117)
Anion gap: 4 mmol/L — ABNORMAL LOW (ref 5–15)
BUN/Creatinine ratio: 8 — ABNORMAL LOW (ref 12–20)
BUN: 5 MG/DL — ABNORMAL LOW (ref 6–20)
Bilirubin, total: 0.4 MG/DL (ref 0.2–1.0)
CO2: 31 mmol/L (ref 21–32)
Calcium: 8.9 MG/DL (ref 8.5–10.1)
Chloride: 100 mmol/L (ref 97–108)
Creatinine: 0.62 MG/DL (ref 0.45–1.15)
GFR est AA: >60 mL/min/{1.73_m2} (ref >60)
GFR est non-AA: >60 mL/min/{1.73_m2} (ref >60)
Globulin: 3.4 g/dL (ref 2.0–4.0)
Glucose: 82 mg/dL (ref 65–100)
Potassium: 3.2 mmol/L — ABNORMAL LOW (ref 3.5–5.1)
Protein, total: 7.3 g/dL (ref 6.4–8.2)
Sodium: 135 mmol/L — ABNORMAL LOW (ref 136–145)

## 2013-01-28 LAB — URINALYSIS W/MICROSCOPIC
Bacteria: NEGATIVE /hpf
Bilirubin: NEGATIVE
Blood: NEGATIVE
Glucose: NEGATIVE mg/dL
Ketone: NEGATIVE mg/dL
Leukocyte Esterase: NEGATIVE
Nitrites: NEGATIVE
Protein: NEGATIVE mg/dL
Specific gravity: 1.01 (ref 1.003–1.030)
Urobilinogen: 0.2 EU/dL (ref 0.2–1.0)
pH (UA): 7 (ref 5.0–8.0)

## 2013-01-28 LAB — LIPASE: Lipase: 256 U/L (ref 73–393)

## 2013-01-28 LAB — LACTIC ACID: Lactic acid: 0.5 MMOL/L (ref 0.4–2.0)

## 2013-01-28 LAB — MAGNESIUM: Magnesium: 1.9 mg/dL (ref 1.6–2.4)

## 2013-01-28 MED ADMIN — sodium chloride 0.9 % bolus infusion 1,000 mL: INTRAVENOUS | @ 23:00:00 | NDC 00409798348

## 2013-01-28 NOTE — ED Notes (Signed)
MD at bedside for discharge home, pt in wheelchair at discharge, family at side.

## 2013-01-28 NOTE — ED Notes (Signed)
Patient back from U/S

## 2013-01-28 NOTE — ED Notes (Signed)
Patient to CT via stretcher.

## 2013-01-28 NOTE — ED Provider Notes (Addendum)
HPI Comments: Katherine [name] is a 57 yo F presenting to ED ambulatory with c/o intermittent diarrhea x 01/16/13 with sx's returning and worsening x 01/25/13. Pt notes sx's of nausea, RUQ pain, LLQ pain, and headache x this week. Pt rates pain 6/10. Pt also notes she has been drinking pedia-lite which she has been able to tolerate. Pt reports she called her PCP today who referred her to ED. Pt also reports hx of diverticulitis and partial colectomy. Pt states she has lost 9lbs in the past 10 days due to sx's. Per pt had GUIAC performed at PCP and was negative. Per spouse, has had increased stress at home over the past two weeks around the onset of sx 's d/t family issues. Pt denies any vomiting, urinary sx's, CP, palpitations, SOB, blood in stool and F/C.     PCP: Janit Pagan, NP  PMhx significant for shingles, lupus, menieres. Fibromyalgia, depression, arthritis,   PShx significant for hysterectomy, back surgery, bladder surgery, R eye corona transplant, colectomy, hernia repair, appendectomy, and  Social hx: -tobacco, -EtOH,      There were no other complaints, changes, physical findings at this time.   Written by Jaynee Eagles, ED Scribe, as dictated by Dede Query, PA-C    The history is provided by the patient and the spouse.        Past Medical History   Diagnosis Date   ??? Other ill-defined conditions      shingles, lupus, menieres, Fibromyalgia   ??? Autoimmune disease    ??? Chronic pain    ??? Depression    ??? Arthritis    ??? Headache    ??? Burning with urination    ??? Bowel disease    ??? Sweats, menopausal    ??? Night sweats    ??? Visual loss    ??? Hearing loss    ??? Diarrhea    ??? Dizziness    ??? Numbness    ??? Easy bruising         Past Surgical History   Procedure Laterality Date   ??? Hx gyn       hysterectomy   ??? Hx orthopaedic       back x2, right knee   ??? Hx urological       bladder surgery   ??? Hx transplant       right corona   ??? Hx heent       right eye corona transplant, both ears, tonsillectomy   ??? Hx  cholecystectomy     ??? Hx appendectomy     ??? Pr abdomen surgery proc unlisted       1 foot of colon removed   ??? Hx hernia repair           Family History   Problem Relation Age of Onset   ??? Heart Disease Mother    ??? Cancer Mother      breast cancer   ??? Hypertension Father    ??? Diabetes Father    ??? Heart Disease Sister         History     Social History   ??? Marital Status: WIDOWED     Spouse Name: N/A     Number of Children: N/A   ??? Years of Education: N/A     Occupational History   ??? Not on file.     Social History Main Topics   ??? Smoking status: Never Smoker    ??? Smokeless tobacco: Not on  file   ??? Alcohol Use: No   ??? Drug Use: Not on file   ??? Sexually Active: Not on file     Other Topics Concern   ??? Not on file     Social History Narrative   ??? No narrative on file                  ALLERGIES: Morphine; Other medication; and Pcn      Review of Systems   Constitutional: Positive for unexpected weight change. Negative for fever and chills.   HENT: Negative for ear pain, congestion, sore throat and rhinorrhea.    Eyes: Negative.  Negative for visual disturbance.   Respiratory: Negative.  Negative for cough, chest tightness, shortness of breath and wheezing.    Cardiovascular: Negative.  Negative for chest pain and palpitations.   Gastrointestinal: Positive for nausea, abdominal pain and diarrhea. Negative for vomiting, constipation and blood in stool.   Endocrine: Negative.    Genitourinary: Negative.  Negative for dysuria, urgency, frequency, hematuria and difficulty urinating.   Musculoskeletal: Negative.  Negative for myalgias and arthralgias.   Skin: Negative.  Negative for rash.   Allergic/Immunologic: Negative.  Negative for environmental allergies and food allergies.   Neurological: Positive for headaches. Negative for seizures.   Hematological: Negative.    Psychiatric/Behavioral: Negative.    All other systems reviewed and are negative.        Filed Vitals:    01/28/13 1930 01/28/13 1945 01/28/13 2000 01/28/13  2015   BP: 112/57 105/60 94/60 112/64   Pulse:       Temp:       Resp:       Height:       Weight:       SpO2: 99% 95% 96% 97%            Physical Exam   Nursing note and vitals reviewed.  Constitutional: She is oriented to person, place, and time. She appears well-developed and well-nourished. No distress.   Pt appears in mild pain/ discomfort, awake and alert, laying in bed in NAD.    HENT:   Head: Normocephalic and atraumatic.   Right Ear: Tympanic membrane, external ear and ear canal normal.   Left Ear: Tympanic membrane, external ear and ear canal normal.   Nose: Nose normal.   Mouth/Throat: Uvula is midline, oropharynx is clear and moist and mucous membranes are normal. No oropharyngeal exudate, posterior oropharyngeal edema or posterior oropharyngeal erythema.   Eyes: Conjunctivae and EOM are normal. Pupils are equal, round, and reactive to light. Right eye exhibits no discharge. Left eye exhibits no discharge. No scleral icterus.   Neck: Normal range of motion. Neck supple. No JVD present. No tracheal deviation present. No thyromegaly present.   Cardiovascular: Normal rate, regular rhythm, normal heart sounds and intact distal pulses.  Exam reveals no gallop and no friction rub.    No murmur heard.  Pulmonary/Chest: Effort normal and breath sounds normal. No stridor. No respiratory distress. She has no wheezes. She has no rales. She exhibits no tenderness.   Abdominal: Soft. She exhibits no distension and no mass. There is tenderness (Mild RUQ TTP). There is no rebound and no guarding.   Diffuse hyperactive BS. No CVA tenderness b/l.   Musculoskeletal: Normal range of motion. She exhibits no edema and no tenderness.   Lymphadenopathy:     She has no cervical adenopathy.   Neurological: She is alert and oriented to person, place, and time.  No cranial nerve deficit. She exhibits normal muscle tone. Coordination normal.   Skin: Skin is warm and dry. No rash noted. She is not diaphoretic. No erythema. No pallor.    Psychiatric: She has a normal mood and affect. Her behavior is normal. Judgment and thought content normal.        MDM     Differential Diagnosis; Clinical Impression; Plan:     DDx: IBS, C. Diff, diverticulosis, diverticulitis, dehydration, UTI, pyelonephritis   Amount and/or Complexity of Data Reviewed:   Clinical lab tests:  Ordered and reviewed  Tests in the radiology section of CPT??:  Ordered and reviewed  Discussion of test results with the performing providers:  Yes   Obtain history from someone other than the patient:  Yes   Review and summarize past medical records:  Yes   Discuss the patient with another provider:  Yes  Progress:   Patient progress:  Stable and improved      Procedures    11:09 PM  Pt unable to provide stool sample while in ED. Provided cup to have pt take stool sample to PCP's office, pt conveys understanding. Pt resting in bed comfortably.   Written by Jaynee Eagles, ED Scribe, as dictated by Dede Query, PA-C    LABORATORY TESTS:  Recent Results (from the past 12 hour(s))   CBC WITH AUTOMATED DIFF    Collection Time     01/28/13  4:25 PM       Result Value Range    WBC 5.1  3.6 - 11.0 K/uL    RBC 4.29  3.80 - 5.20 M/uL    HGB 14.3  11.5 - 16.0 g/dL    HCT 56.2  13.0 - 86.5 %    MCV 95.6  80.0 - 99.0 FL    MCH 33.3  26.0 - 34.0 PG    MCHC 34.9  30.0 - 36.5 g/dL    RDW 78.4  69.6 - 29.5 %    PLATELET 276  150 - 400 K/uL    NEUTROPHILS 55  32 - 75 %    LYMPHOCYTES 35  12 - 49 %    MONOCYTES 9  5 - 13 %    EOSINOPHILS 1  0 - 7 %    BASOPHILS 0  0 - 1 %    ABS. NEUTROPHILS 2.8  1.8 - 8.0 K/UL    ABS. LYMPHOCYTES 1.8  0.8 - 3.5 K/UL    ABS. MONOCYTES 0.4  0.0 - 1.0 K/UL    ABS. EOSINOPHILS 0.0  0.0 - 0.4 K/UL    ABS. BASOPHILS 0.0  0.0 - 0.1 K/UL   METABOLIC PANEL, COMPREHENSIVE    Collection Time     01/28/13  4:25 PM       Result Value Range    Sodium 135 (*) 136 - 145 mmol/L    Potassium 3.2 (*) 3.5 - 5.1 mmol/L    Chloride 100  97 - 108 mmol/L    CO2 31  21 - 32 mmol/L    Anion gap 4 (*)  5 - 15 mmol/L    Glucose 82  65 - 100 mg/dL    BUN 5 (*) 6 - 20 MG/DL    Creatinine 2.84  1.32 - 1.15 MG/DL    BUN/Creatinine ratio 8 (*) 12 - 20      GFR est AA >60  >60 ml/min/1.60m2    GFR est non-AA >60  >60 ml/min/1.93m2    Calcium 8.9  8.5 -  10.1 MG/DL    Bilirubin, total 0.4  0.2 - 1.0 MG/DL    ALT 30  12 - 78 U/L    AST 50 (*) 15 - 37 U/L    Alk. phosphatase 70  45 - 117 U/L    Protein, total 7.3  6.4 - 8.2 g/dL    Albumin 3.9  3.5 - 5.0 g/dL    Globulin 3.4  2.0 - 4.0 g/dL    A-G Ratio 1.1  1.1 - 2.2     LIPASE    Collection Time     01/28/13  4:25 PM       Result Value Range    Lipase 256  73 - 393 U/L   LACTIC ACID, PLASMA    Collection Time     01/28/13  4:25 PM       Result Value Range    Lactic acid 0.5  0.4 - 2.0 MMOL/L   MAGNESIUM    Collection Time     01/28/13  4:25 PM       Result Value Range    Magnesium 1.9  1.6 - 2.4 mg/dL   URINALYSIS W/MICROSCOPIC    Collection Time     01/28/13  7:25 PM       Result Value Range    Color YELLOW/STRAW      Appearance CLEAR  CLEAR      Specific gravity 1.010  1.003 - 1.030      pH (UA) 7.0  5.0 - 8.0      Protein NEGATIVE   NEG mg/dL    Glucose NEGATIVE   NEG mg/dL    Ketone NEGATIVE   NEG mg/dL    Bilirubin NEGATIVE   NEG      Blood NEGATIVE   NEG      Urobilinogen 0.2  0.2 - 1.0 EU/dL    Nitrites NEGATIVE   NEG      Leukocyte Esterase NEGATIVE   NEG      WBC 0-4  0 - 4 /hpf    RBC 0-5  0 - 5 /hpf    Epithelial cells FEW  FEW /lpf    Bacteria NEGATIVE   NEG /hpf    Hyaline Cast 0-2  0 - 2       IMAGING RESULTS:    CT ABD PELV W CONT (Final result)  Result time: 01/28/13 21:59:06      Final result by Rad Results In Edi (01/28/13 21:59:06)      Narrative:    **Final Report**      ICD Codes / Adm.Diagnosis: 35 110002 / Diarrhea Abdominal Pain  Examination: CT ABD PELVIS W CON - [id number] - Jan 28 2013 9:40PM  Accession No: [id number]  Reason: abdominal pain      REPORT:  EXAM: CT ABDOMEN PELVIS WITH CONTRAST    INDICATION: Left lower quadrant pain    COMPARISON:  01/09/2012.    CONTRAST: 95 mL of optiray-350.    TECHNIQUE:  Multislice helical CT was performed from the diaphragm to the symphysis   pubis during uneventful rapid bolus intravenous contrast administration.   Oral contrast was also administered. Contiguous 5 mm axial images were   reconstructed and lung and soft tissue windows were generated. Coronal and   sagittal reformations were generated.    FINDINGS:  The visualized portions of the lung bases are clear.    There are no focal abnormalities within the liver, spleen, pancreas, adrenal   glands or kidneys. The patient  is status post cholecystectomy.    The aorta tapers without aneurysm. There is no retroperitoneal adenopathy or   mass.    The bowel is normal. The appendix is surgically absent. There is no ascites   or free intraperitoneal air. There is an unchanged 9 mm focus of fluid in   the left inguinal region that is unchanged. This is likely incidental.    The patient is status post hysterectomy. There is no pelvic mass or   adenopathy.    Mild spondylosis is present at L5-S1. Degenerative changes are present in   the posterior facet the lower lumbar spine. No evidence of a destructive   osseous lesion.      IMPRESSION:  Status post cholecystectomy, appendectomy, and hysterectomy. No evidence of   acute process.                  Signing/Reading Doctor: Jonelle Sports ([id number])   Approved: Genelle Bal PEAT ([id number]) Jan 28 2013 9:56PM                         [place] ABD COMP (Final result)  Result time: 01/28/13 20:56:51      Final result by Rad Results In Edi (01/28/13 20:56:51)      Narrative:    **Final Report**      ICD Codes / Adm.Diagnosis: 35 110002 / Diarrhea Abdominal Pain  Examination: US ABDOMEN - [id number] - Jan 28 2013 8:36PM  Accession No: [id number]  Reason: Abdominal pain      REPORT:  EXAM: US ABDOMEN    INDICATION: Abdominal pain    COMPARISON: 01/27/2012    TECHNIQUE: Complete abdominal ultrasound.    FINDINGS:    Liver: echogenicity is within normal limits.  No focal liver lesion.    Main portal vein flow: Toward the liver.    Fluid: No ascites.    Aorta and IVC: Not well visualized secondary to bowel gas and body habitus.    Gallbladder: Surgically absent    Bile ducts: There is no intra or extrahepatic biliary ductal dilatation.   The common bile duct measures 3 mm.    Pancreas: The visualized portions are within normal limits.    Kidneys: Right length: 11.5 cm. Left length: 12.2 cm. No hydronephrosis.    Spleen: 11.6 cm in length, which is within normal limits.      IMPRESSION:  1. Status post cholecystectomy.  2. Otherwise unremarkable.          Signing/Reading Doctor: Jonelle Sports [phone])   Approved: Genelle Bal PEAT [phone]) Jan 28 2013 8:54PM                MEDICATIONS GIVEN:  Medications   sodium chloride (NS) flush 5-10 mL (not administered)   sodium chloride (NS) flush 5-10 mL (not administered)   sodium chloride 0.9 % bolus infusion 1,000 mL (0 mL IntraVENous IV Completed 01/28/13 2041)   ondansetron (ZOFRAN) injection 4 mg (4 mg IntraVENous Given 01/28/13 2008)   ketorolac (TORADOL) injection 30 mg (30 mg IntraVENous Given 01/28/13 2008)   ioversol (OPTIRAY) 350 mg iodine/mL contrast solution 100 mL (100 mL IntraVENous Given 01/28/13 2142)   sodium chloride (NS) flush 10 mL (10 mL IntraVENous Given 01/28/13 2142)   0.9% sodium chloride infusion (50 mL/hr IntraVENous Given 01/28/13 2142)       IMPRESSION:  1. Abdominal pain, right upper quadrant    2. Abdominal pain, LLQ (left lower quadrant)  3. Diarrhea    4. Nausea alone        PLAN:  1. Rx for zofran   2. Rest and increase clear fluids to obtain adequate hydration.  3. F/u with PCP and GI  Return to ED if worse     11:11 PM  Patient's results have been reviewed with them. Patient and/or family have verbally conveyed their understanding and agreement of the patient's signs, symptoms, diagnosis, treatment and prognosis and additionally agree to follow up as recommended or return to the Emergency Room should their  condition change prior to follow-up. Discharge instructions have also been provided to the patient with some educational information regarding their diagnosis as well a list of reasons why they would want to return to the ER prior to their follow-up appointment should their condition change.   Written by Jaynee Eagles, ED Scribe, as dictated by Dede Query, PA-C    I was personally available for consultation in the emergency department.  I have reviewed the chart and agree with the documentation recorded by the [facility], including the assessment, treatment plan, and disposition.  Costella Hatcher, MD

## 2013-01-28 NOTE — ED Notes (Signed)
TRANSFER - OUT REPORT:    Verbal report given to Dennison Nancy RN on [employer]       Report consisted of patient???s Situation, Background,and  Assessment.     I  Opportunity for questions and clarification was provided.

## 2013-01-28 NOTE — ED Notes (Signed)
Patient back from CT via stetcher

## 2013-01-29 MED ADMIN — sodium chloride (NS) flush 10 mL: INTRAVENOUS | @ 02:00:00 | NDC 87701099893

## 2013-01-29 MED ADMIN — ondansetron (ZOFRAN) injection 4 mg: INTRAVENOUS | NDC 00409475503

## 2013-01-29 MED ADMIN — 0.9% sodium chloride infusion: INTRAVENOUS | @ 02:00:00 | NDC 00409798303

## 2013-01-29 MED ADMIN — ioversol (OPTIRAY) 350 mg iodine/mL contrast solution 100 mL: INTRAVENOUS | @ 02:00:00 | NDC 00019133311

## 2013-01-29 MED ADMIN — ketorolac (TORADOL) injection 30 mg: INTRAVENOUS | NDC 00409379501

## 2013-01-29 MED FILL — ONDANSETRON (PF) 4 MG/2 ML INJECTION: 4 mg/2 mL | INTRAMUSCULAR | Qty: 2

## 2013-01-29 MED FILL — SODIUM CHLORIDE 0.9 % IV: INTRAVENOUS | Qty: 1000

## 2013-01-29 MED FILL — KETOROLAC TROMETHAMINE 30 MG/ML INJECTION: 30 mg/mL (1 mL) | INTRAMUSCULAR | Qty: 1

## 2013-01-29 MED FILL — OPTIRAY 350 MG IODINE/ML INTRAVENOUS SOLUTION: 350 mg iodine/mL | INTRAVENOUS | Qty: 100

## 2013-02-01 NOTE — Telephone Encounter (Signed)
PCP office requested last notes for Ms Curvin.  Faxed 07/29/12 phone encounter, 06/21/12 venous duplex & 06/16/13 office note to (f) [phone], confirmation received.

## 2013-02-01 NOTE — Telephone Encounter (Signed)
Katherine [name] called to reschedule bilateral resting PVR.  Advised pt test is scheduled for Tuesday, February 08, 2013 at 11:00 am.  She is to arrive at 10:30 to register at vascular lab.

## 2013-02-08 NOTE — Procedures (Signed)
[facility]  *** FINAL REPORT ***    Name: Katherine [name], Katherine [name]  MRN: [id number]  DOB: 1955/09/24  Visit: [id number]  Date: 08 Feb 2013    TYPE OF TEST: Peripheral Arterial Testing    REASON FOR TEST  Coldness and burning in the feet.    Right Leg  Segmentals: Normal                     mmHg  Brachial          98  High thigh       138  Low thigh        137  Calf             122  Posterior tibial 110  Dorsalis pedis   113  Peroneal  Metatarsal  Toe pressure      81  Doppler:    Normal  Ankle/Brachial: 1.15    Left Leg  Segmentals: Normal                     mmHg  Brachial          97  High thigh       131  Low thigh        132  Calf             128  Posterior tibial 111  Dorsalis pedis   103  Peroneal  Metatarsal  Toe pressure      65  Doppler:    Normal  Ankle/Brachial: 1.13    INTERPRETATION/FINDINGS  1. No evidence of significant peripheral arterial disease at rest in  the right leg.  2. No evidence of significant peripheral arterial disease at rest in  the left leg.  3. The right ankle/brachial index is 1.15 and the left ankle/brachial  index is 1.13.  4. The right great toe/brachial index is 0.83 and the left great  toe/brachial index is 0.66.    ADDITIONAL COMMENTS    I have personally reviewed the data relevant to the interpretation of  this  study.    TECHNOLOGIST:  Marijo Sanes, RDMS    PHYSICIAN:  Electronically signed by:  Lawerance Cruel, MD  02/09/2013 02:44 PM

## 2013-02-08 NOTE — Progress Notes (Signed)
[facility] Vascular Lab Technologist Preliminary Report:  PVR Lower Extremity    Pressure (mmHg) Right    Left    Brachial:  98   97  High Thigh:  138   131  Low Thigh:  137   132  Calf:   122   128  Ankle PTA:  110   111  Ankle DPA:  113   103  Great Toe:  81   65    Waveform:  Right   Left  CFA:   Triphasic  Triphasic  Popliteal:  Triphasic  Triphasic  PTA:   Biphasic  Biphasic  DPA:   Triphasic  Biphasic  Great Toe:  Pulsatile  Pulsatile    Right ABI:   1.15  Left ABI 1.13  Right TBI: 0.83  Left TBI 0.66    Final report to follow.    CFA = Common Femoral Artery  PTA = Posterior Tibial Artery  DPA = Dorsalis Pedis Artery  ABI = Ankle Brachial Index  TBI = Toe Brachial Index

## 2013-02-09 NOTE — Procedures (Signed)
[facility]  *** FINAL REPORT ***    Name: [name], Katherine  MRN: [id number]  DOB: 28 Oct 1955  Visit: [id number]  Date: 08 Feb 2013    TYPE OF TEST: Peripheral Arterial Testing    REASON FOR TEST  Coldness and burning in the feet.    Right Leg  Segmentals: Normal                     mmHg  Brachial          98  High thigh       138  Low thigh        137  Calf             122  Posterior tibial 110  Dorsalis pedis   113  Peroneal  Metatarsal  Toe pressure      81  Doppler:    Normal  Ankle/Brachial: 1.15    Left Leg  Segmentals: Normal                     mmHg  Brachial          97  High thigh       131  Low thigh        132  Calf             128  Posterior tibial 111  Dorsalis pedis   103  Peroneal  Metatarsal  Toe pressure      65  Doppler:    Normal  Ankle/Brachial: 1.13    INTERPRETATION/FINDINGS  1. No evidence of significant peripheral arterial disease at rest in  the right leg.  2. No evidence of significant peripheral arterial disease at rest in  the left leg.  3. The right ankle/brachial index is 1.15 and the left ankle/brachial  index is 1.13.  4. The right great toe/brachial index is 0.83 and the left great  toe/brachial index is 0.66.    ADDITIONAL COMMENTS    I have personally reviewed the data relevant to the interpretation of  this  study.    TECHNOLOGIST:  David Charbonneau, RDMS    PHYSICIAN:  Electronically signed by:  Neale Marzette, MD  02/09/2013 02:44 PM

## 2013-02-16 NOTE — Progress Notes (Signed)
[facility] at  [facility]  [address]  [address]  [place], [place] [address]  [phone]     Fax [phone]       Electrodiagnostic Study Report    Test Date:  02/16/2013    Patient: Katherine [name] DOB: June 25, 1956 Physician: Jettie Pagan, M.D.   Sex: Female  < Ref Phys: Levada Dy     Impression:    This study showed electrophysiological evidence of    1). Decreased effort in the right leg, as noted by decreased recruitment, but no denervation changes seen, and motor unit potentials essentially unremarkable except in the feet, which could suggest a chronic L5-S1 radiculopathy versus just decreased effort by the patient.  2). 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 Absolute conductions  All appeared to be relatively normal. Again some of the slightly polyphasic units could be secondary to variable effort.  Clinical correlation recommended and correlation with imaging studies is also recommended,, and repeat studies in 6-12 months time may be of further diagnostic benefit if clinically indicated.    Please see the detailed findings below.    EMG & NCV Findings:  Evaluation of the Left peroneal motor nerve showed normal distal onset latency (5.9 ms), normal amplitude (3.2 mV), normal conduction velocity (B Fib-Ankle, 40 m/s), and normal conduction velocity (Poplt-B Fib, 62 m/s).  The Right peroneal motor nerve showed normal distal onset latency (5.1 ms), reduced amplitude (1.5 mV), normal conduction velocity (B Fib-Ankle, 41 m/s), and normal conduction velocity (Poplt-B Fib, 53 m/s).  The Left tibial motor nerve showed normal distal onset latency (5.3 ms), normal amplitude (7.8 mV), and normal conduction velocity (Knee-Ankle, 41 m/s).  The Right tibial motor nerve showed  prolonged distal onset latency (6.8 ms), normal amplitude (8.1 mV), and normal conduction velocity (Knee-Ankle, 38 m/s).  The Left sural sensory and the Right sural sensory nerves showed prolonged distal peak latency (L4.3, R4.3 ms), normal amplitude (L7.4, R6.6 ??V), and normal conduction velocity (Calf-Lat Mall, L37, R39 m/s).        Nerve Conduction Studies  Anti Sensory Summary Table     Site NR Peak (ms) P-T Amp (??V) Site1 Site2 Dist (cm) Vel (m/s)   Left Sural Anti Sensory (Lat Mall)  22.2??C   Calf    4.3 7.4 Calf Lat Mall 14.0 37   Site 2    4.3 4.4       Site 3    4.4 6.5       Right Sural Anti Sensory (Lat Mall)  22.1??C   Calf    4.3 6.6 Calf Lat Mall 14.0 39   Site 2    4.3 4.8       Site 3    4.3 6.7         Motor Summary Table     Site NR Onset (ms) O-P Amp (mV) Site1 Site2 Dist (cm) Vel (m/s)   Left Peroneal Motor (Ext Dig Brev)  22.2??C   Ankle    5.9 3.2 Ankle Ext Dig Brev 7.0    B Fib    14.5 2.9 B Fib Ankle 34.0 40   Poplt    15.8 3.0 Poplt B Fib 8.0 62   Right Peroneal Motor (Ext Dig Brev)  22.1??C   Ankle    5.1 1.5 Ankle Ext  Dig Brev 7.0    B Fib    13.0 1.5 B Fib Ankle 32.0 41   Poplt    14.5 1.5 Poplt B Fib 8.0 53   Left Tibial Motor (Abd Hall Brev)  22.2??C   Ankle    5.3 7.8 Ankle Abd Hall Brev 7.0    Knee    15.0 7.5 Knee Ankle 40.0 41   Right Tibial Motor (Abd Hall Brev)  22.1??C   Ankle    6.8 8.1 Ankle Abd Hall Brev 7.0    Knee    17.9 4.4 Knee Ankle 42.0 38     EMG     Side Muscle Nerve Root Ins Act Fibs Psw Amp Poly Recrt Activation Fasics Comment   Left AntTibialis Dp Br Peron L4-5 Nml Nml Nml Nml 0 Nml Nml 0    Left VastusLat Femoral L2-4 Nml Nml Nml Nml 0 Nml Nml 0    Left MedGastroc Tibial S1-2 Nml Nml Nml Nml 0 Nml Nml 0    Left AbdHallucis MedPlantar S1-2 Nml Nml Nml Nml 1+ Nml Nml 0    Left Ext Dig Brev Dp Br Peron L5, S1 Nml Nml Nml Nml 1+ Nml Nml 0    Right VastusLat Femoral L2-4 Nml Nml Nml Nml 0 Nml Nml 0    Right AntTibialis Dp Br Peron L4-5 Nml Nml Nml Nml 0 Reduced Decr 0     Right Peroneus Long Sup Br Peron L5-S1 Nml Nml Nml Nml 0 Reduced Decr 0    Right MedGastroc Tibial S1-2 Nml Nml Nml Nml 0 Reduced Decr 0    Right Ext Dig Brev Dp Br Peron L5, S1 Nml Nml Nml Nml 2+ Reduced Decr 0    Right AbdHallucis MedPlantar S1-2 Nml Nml Nml Nml 1+ Reduced Decr 0        Waveforms:                    __________________  Jettie Pagan, M.D.

## 2013-03-03 NOTE — Telephone Encounter (Signed)
Surgery    I reviewed the results of  Non invasive vascular testing obtained at the end of June with the patient.    Venous US of legs was normal.    Arterial testing of legs showed normal flow into the feet with minimal decrease in toe pressures.    It does not appear that vascular disease is the cause of any of her leg or foot symptoms.  Neurogenic source may be possible.    Warnell Forester, MD

## 2013-03-07 NOTE — Progress Notes (Signed)
Faxed 03/03/13 phone encounter to Lamar Blinks (f) [phone], confirmation received.

## 2013-03-09 NOTE — Progress Notes (Signed)
[facility] NEUROLOGY [place], [facility]   [address] [place] [address]   [place], [place] [address]   [phone] Office   [phone] Fax      Neuropsychology    Initial Diagnostic Interview Note      Referral:  Janit Pagan, NP, Dr. Jettie Pagan    Katherine [name] is a 57 y.o. right handed widowed Caucasian female who was unaccompanied to the initial clinical interview on 03/09/13.  Please refer to her medical records for details pertaining to her history.  She graduated from high school and some college level courses.  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  She got sick approximately 8-9 years ago.  Singles, then Parvo and then was diagnosed with Lupus as well as fibromyalgia.  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  She was not able to return to work and it has taken quite the emotional toll.  Children live right there on her property and two years ago could not remember their names could not say it.  Verbal fluency problems.  Word finding difficulties.  Will be speaking but can't speak what she wants to speak.  She knows what she is trying to say but can't find the word.  Problems more prominent in afternoon and evening.  Sleep patterns have changed.  Is up in the middle of the night.  Shunt in left ear.  No counseling currently. On Cymbalta.  She does not drive because of the ear issues.  Somebody does her medications for her.  Most bills electronically paid.        See also records.  Monitors carefully dietary intact.  Lost 80 pounds.  Lost her spouse     ATP  WORLD  DOlrd  Recall - [employer]  Cues given - chair, penny correct    Clock: normal    TOPF given:     No previous neuropsych testing.      Historian: Good, but slow to respond.    Praxis: No UE apraxia  R/L Orientation: Intact to self and to other  Dress: within normal limits   Weight: within normal limits   Appearance/Hygiene: within normal limits   Gait: Slow, without  assistance  Assistive Devices: Glasses  Mood: Depressed  Affect: Tearful   Comprehension: within normal limits, but slow rate  Thought Process: Slow but logical and goal oriented  Expressive Language: mild initiation/ verbal fluency problems  Receptive Language: within normal limits   Motor:  No cognitive or motor perseveration  ETOH: Rarely  Tobacco: 1/2 pd  Illicit: Denied  SI/HI: Denied  Psychosis: Denied  Insight: Within normal limits  Judgment: Within normal limits  Other Psych: As above      Past Medical History   Diagnosis Date   ??? Other ill-defined conditions      shingles, lupus, menieres, Fibromyalgia   ??? Autoimmune disease    ??? Chronic pain    ??? Depression    ??? Arthritis    ??? Headache    ??? Burning with urination    ??? Bowel disease    ??? Sweats, menopausal    ??? Night sweats    ??? Visual loss    ??? Hearing loss    ??? Diarrhea    ??? Dizziness    ??? Numbness    ??? Easy bruising        Past Surgical History   Procedure Laterality Date   ???  Hx gyn       hysterectomy   ??? Hx orthopaedic       back x2, right knee   ??? Hx urological       bladder surgery   ??? Hx transplant       right corona   ??? Hx heent       right eye corona transplant, both ears, tonsillectomy   ??? Hx cholecystectomy     ??? Hx appendectomy     ??? Pr abdomen surgery proc unlisted       1 foot of colon removed   ??? Hx hernia repair         Allergies   Allergen Reactions   ??? Morphine Other (comments)     "it does not stop the pain"   ??? Other Medication Other (comments)     Pt must have low sodium fluid because it will cause an attack of the mineres   ??? Pcn (Penicillins) Rash and Swelling       Family History   Problem Relation Age of Onset   ??? Heart Disease Mother    ??? Cancer Mother      breast cancer   ??? Hypertension Father    ??? Diabetes Father    ??? Heart Disease Sister        History   Substance Use Topics   ??? Smoking status: Never Smoker    ??? Smokeless tobacco: Not on file   ??? Alcohol Use: No       Current Outpatient Prescriptions   Medication Sig Dispense  Refill   ??? ondansetron (ZOFRAN ODT) 4 mg disintegrating tablet Take 1 Tab by mouth every eight (8) hours as needed for Nausea.  10 Tab  0   ??? primidone (MYSOLINE) 50 mg tablet Take 1 Tab by mouth three (3) times daily.  270 Tab  3   ??? topiramate (TOPAMAX) 25 mg tablet Take 100 mg by mouth nightly as needed.       ??? topiramate (TOPAMAX) 200 mg tablet Take 200 mg by mouth every morning.       ??? Cetirizine (ZYRTEC) 10 mg Cap Take 10 mg by mouth.       ??? ketorolac (TORADOL) 10 mg tablet Take 10 mg by mouth.       ??? hydroxychloroquine (PLAQUENIL) 200 mg tablet Take 200 mg by mouth daily.       ??? meloxicam (MOBIC) 15 mg tablet Take 15 mg by mouth daily.       ??? butalbital-acetaminophen-caffeine (FIORICET) 50-325-40 mg per tablet Take 1-2 Tabs by mouth every four (4) hours as needed for Pain.  20 Tab  0   ??? prochlorperazine (COMPAZINE) 10 mg tablet Take 1 Tab by mouth every eight (8) hours as needed for Nausea.  12 Tab  0   ??? potassium chloride SR (KLOR-CON 10) 10 mEq tablet Take 1 Tab by mouth daily.  30 Tab  0   ??? gabapentin (NEURONTIN) 300 mg capsule Take 600 mg by mouth three (3) times daily.       ??? valacyclovir (VALTREX) 1 g tablet Take 1,000 mg by mouth daily.       ??? duloxetine (CYMBALTA) 60 mg capsule Take 60 mg by mouth two (2) times a day.       ??? triamterene-hydrochlorothiazide (DYAZIDE) 37.5-25 mg per capsule Take 1 Cap by mouth daily.       ??? hydrocodone-acetaminophen (LORTAB) 5-500 mg per tablet Take 1 Tab by  mouth every eight (8) hours as needed.       ??? ergocalciferol (VITAMIN D) 50,000 unit capsule Take 50,000 Units by mouth every seven (7) days.       ??? cyclobenzaprine (FLEXERIL) 10 mg tablet Take 10 mg by mouth two (2) times a day.       ??? diazepam (VALIUM) 5 mg tablet Take 5 mg by mouth every four (4) hours.       ??? azelastine (ASTELIN) 137 mcg nasal spray 1 Spray by Nasal route two (2) times a day. Administer to right and left nostril.       ??? butalbital-acetaminophen-caffeine (FIORICET) 50-325-40 mg  per tablet Take 2 Tabs by mouth every four (4) hours as needed.       ??? prednisoLONE acetate (PRED FORTE) 1 % ophthalmic suspension Administer 1 Drop to right eye daily.             Plan:  Obtain authorization for testing from insurance company.  Report to follow once testing, scoring, and interpretation completed.

## 2013-03-10 NOTE — Progress Notes (Signed)
[facility] NEUROLOGY [place], [facility]   [address] [place] [address]   [place], [place] [address]   [phone] Office   [phone] Fax      Neuropsychological Evaluation Report    Referral:  Janit Pagan, NP, Dr. Jettie Pagan    Katherine [name] is a 57 y.o. right handed widowed Caucasian female who was unaccompanied to the initial clinical interview on 03/09/13.  Please refer to her medical records for details pertaining to her history.  She graduated from high school and some college level courses.  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  She got sick approximately 8-9 years ago.  Singles, then Parvo and then was diagnosed with Lupus as well as fibromyalgia.  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  She was not able to return to work and it has taken quite the emotional toll.  Children live right there on her property and two years ago could not remember their names could not say it.  Verbal fluency problems.  Word finding difficulties.  Will be speaking but can't speak what she wants to speak.  She knows what she is trying to say but can't find the word.  Problems more prominent in afternoon and evening.  Sleep patterns have changed.  Is up in the middle of the night.  Shunt in left ear.  No counseling currently. On Cymbalta.  She does not drive because of the ear issues.  Somebody does her medications for her.  Most bills electronically paid.      See also records.  Monitors carefully dietary intact.  Lost 80 pounds.  Lost her spouse     ATP  WORLD  DOlrd  Recall - [employer]  Cues given - chair, penny correct    Clock: normal    TOPF given: 23    No previous neuropsych testing.      Historian: Good, but slow to respond.    Praxis: No UE apraxia  R/L Orientation: Intact to self and to other  Dress: within normal limits   Weight: within normal limits   Appearance/Hygiene: within normal limits   Gait: Slow, without assistance   Assistive Devices: Glasses  Mood: Depressed  Affect: Tearful   Comprehension: within normal limits, but slow rate  Thought Process: Slow but logical and goal oriented  Expressive Language: mild initiation/ verbal fluency problems  Receptive Language: within normal limits   Motor:  No cognitive or motor perseveration  ETOH: Rarely  Tobacco: 1/2 pd  Illicit: Denied  SI/HI: Denied  Psychosis: Denied  Insight: Within normal limits  Judgment: Within normal limits  Other Psych: As above      Past Medical History   Diagnosis Date   ??? Other ill-defined conditions      shingles, lupus, menieres, Fibromyalgia   ??? Autoimmune disease    ??? Chronic pain    ??? Depression    ??? Arthritis    ??? Headache    ??? Burning with urination    ??? Bowel disease    ??? Sweats, menopausal    ??? Night sweats    ??? Visual loss    ??? Hearing loss    ??? Diarrhea    ??? Dizziness    ??? Numbness    ??? Easy bruising        Past Surgical History   Procedure Laterality Date   ??? Hx gyn  hysterectomy   ??? Hx orthopaedic       back x2, right knee   ??? Hx urological       bladder surgery   ??? Hx transplant       right corona   ??? Hx heent       right eye corona transplant, both ears, tonsillectomy   ??? Hx cholecystectomy     ??? Hx appendectomy     ??? Pr abdomen surgery proc unlisted       1 foot of colon removed   ??? Hx hernia repair         Allergies   Allergen Reactions   ??? Morphine Other (comments)     "it does not stop the pain"   ??? Other Medication Other (comments)     Pt must have low sodium fluid because it will cause an attack of the mineres   ??? Pcn (Penicillins) Rash and Swelling       Family History   Problem Relation Age of Onset   ??? Heart Disease Mother    ??? Cancer Mother      breast cancer   ??? Hypertension Father    ??? Diabetes Father    ??? Heart Disease Sister        History   Substance Use Topics   ??? Smoking status: Never Smoker    ??? Smokeless tobacco: Not on file   ??? Alcohol Use: No       Current Outpatient Prescriptions   Medication Sig Dispense Refill   ???  ondansetron (ZOFRAN ODT) 4 mg disintegrating tablet Take 1 Tab by mouth every eight (8) hours as needed for Nausea.  10 Tab  0   ??? primidone (MYSOLINE) 50 mg tablet Take 1 Tab by mouth three (3) times daily.  270 Tab  3   ??? topiramate (TOPAMAX) 25 mg tablet Take 100 mg by mouth nightly as needed.       ??? topiramate (TOPAMAX) 200 mg tablet Take 200 mg by mouth every morning.       ??? Cetirizine (ZYRTEC) 10 mg Cap Take 10 mg by mouth.       ??? ketorolac (TORADOL) 10 mg tablet Take 10 mg by mouth.       ??? hydroxychloroquine (PLAQUENIL) 200 mg tablet Take 200 mg by mouth daily.       ??? meloxicam (MOBIC) 15 mg tablet Take 15 mg by mouth daily.       ??? butalbital-acetaminophen-caffeine (FIORICET) 50-325-40 mg per tablet Take 1-2 Tabs by mouth every four (4) hours as needed for Pain.  20 Tab  0   ??? prochlorperazine (COMPAZINE) 10 mg tablet Take 1 Tab by mouth every eight (8) hours as needed for Nausea.  12 Tab  0   ??? potassium chloride SR (KLOR-CON 10) 10 mEq tablet Take 1 Tab by mouth daily.  30 Tab  0   ??? gabapentin (NEURONTIN) 300 mg capsule Take 600 mg by mouth three (3) times daily.       ??? valacyclovir (VALTREX) 1 g tablet Take 1,000 mg by mouth daily.       ??? duloxetine (CYMBALTA) 60 mg capsule Take 60 mg by mouth two (2) times a day.       ??? triamterene-hydrochlorothiazide (DYAZIDE) 37.5-25 mg per capsule Take 1 Cap by mouth daily.       ??? hydrocodone-acetaminophen (LORTAB) 5-500 mg per tablet Take 1 Tab by mouth every eight (8) hours as needed.       ???  ergocalciferol (VITAMIN D) 50,000 unit capsule Take 50,000 Units by mouth every seven (7) days.       ??? cyclobenzaprine (FLEXERIL) 10 mg tablet Take 10 mg by mouth two (2) times a day.       ??? diazepam (VALIUM) 5 mg tablet Take 5 mg by mouth every four (4) hours.       ??? azelastine (ASTELIN) 137 mcg nasal spray 1 Spray by Nasal route two (2) times a day. Administer to right and left nostril.       ??? butalbital-acetaminophen-caffeine (FIORICET) 50-325-40 mg per tablet  Take 2 Tabs by mouth every four (4) hours as needed.       ??? prednisoLONE acetate (PRED FORTE) 1 % ophthalmic suspension Administer 1 Drop to right eye daily.             Plan:  Obtain authorization for testing from insurance company.  Report to follow once testing, scoring, and interpretation completed.        Neuropsychological Evaluation  Patient Testing 03/09/13 Report Completed 03/10/13  A Psychometrist Assisted w/ portions of this evaluation while under my direct personal supervision      Please refer to the patient's initial interview progress note (copied above) and her medical records for details pertaining to her history.  Today's neuropsychological test scores follow:    The following tests were administered: Neuropsychological Mental Status Exam (NP), Revised Memory & Behavior Checklist (NP),  Finger Tapping Test, [employer], Connors??? Continuous Performance Test ??? III, TOPF (NP), Clock Drawing Test (NP), Wechsler Adult Intelligence Scale ??? IV, Verbal Fluency Tests, [employer] ??? Revised, Trailmaking Test Parts A & B,  [place] Verbal Learning Test ??? II, Rey Complex Figure Test, Beck Depression Inventory ??? II, Beck Anxiety Inventory, McGill Pain Questionnaire (NP), Personality Assessment Inventory, History Taking  & Clinical Interview With The Patient (NP), Review Of Available Records (NP).    Test Findings:  Note:  The patient???s raw data have been compared with currently available norms which include demographic corrections for age, gender, and/or education.  Sometimes, the patient???s scores are compared to demographically similar individuals as close to the patient???s age, education level, etc., as possible.  "Average" is viewed as being +/- 1 standard deviation (SD) from the stated mean for a particular test score.  "Low average" is viewed as being between 1 and 2 SD below the mean, and above average is viewed as being 1 and 2 SD above the mean.  Scores falling in the  ???borderline??? range (between 1-1/2 and 2 SD below the mean) are viewed with particular attention as to whether they are normal or abnormal neurocognitive test scores.  Other methods of inference in analyzing the test data are also utilized, including the pattern and range of scores in the profile, bilateral motor functions, and the presence, if any, of pathognomonic signs.      Behaviorally, the patient was friendly and cooperative and appeared motivated to perform well during this examination.  Within this context, the results of this evaluation are viewed as a valid reflection of the patient???s actual neurocognitive and emotional status.       Her structured word list fluency, as assessed by the FAS Test, was within the moderately impaired range with a T score of 27.  Category fluency was within the moderately to severely impaired range with a T score of 24.  Confrontation naming ability, as assessed by the [facility] ??? Revised, was within the below  average range at 53/60 correct (T = 43).   This pattern of performance is indicative of a patient who is at increased risk for day-to-day problems with verbal fluency.  Confrontation naming ability is within normal limits.       The patient was administered the Connors??? Continuous Performance Test ??? III andreview of the subscales within this instrument revealed significant problems with inattentiveness without impulsivity. This pattern of performance is indicative of a patient who is at increased risk for day-to-day problems with sustained visual attention/concentration.       The patient was administered the Wechsler Adult Intelligence Scale ??? IV.  Her Verbal Comprehension Index score of 81 (10th %ile) was within the low average range.  Her Perceptual Reasoning Index score of 75 (5th %Ile) was within the borderline range.  There was a clinically significant difference between her borderline range Working Memory Index score of 74 (4th %ile) and her extremely low   range Processing Speed Index score of 59 (0.3rd %ile).  This pattern of performance is indicative of a patient who is at increased risk for day-to-day problems with processing speed.  Perceptual reasoning and working memory capacity were both borderline.  These scores reflect a decline in functioning from levels estimated from a measure assessing premorbid functioning (TOPF).       The patient was administered the [place] Verbal Learning Test  - II and generated a normal range and positive learning curve over five repeated auditory word list learning trials.  An interference trial was within normal limits.  Free and cued, short and long delayed recall were within  the normal range.  Recognition and forced choice recall were within normal limits.  This pattern of performance is not indicative of a patient who is at increased risk for day-to-day problems with auditory learning and/or memory.       The patient???s performance on the copy portion of the Rey Complex Figure Test was within normal limits  (>16th %ile).  Recall for the complex design was within the impaired range after both short (1st %ile) and long (2nd %ile) delays.  Recognition recall was low average (21st %ile).  This pattern of performance is indicative of a patient who is at increased risk for day-to-day problems with visual organization and visual delayed memory.  At the same time, the fact that her recognition recall is stronger than her free recall suggests a functional element to this performance.       Simple timed visual motor sequencing (Trailmaking Test Part A) was within the moderately impaired range with a T score of 28.  Her performance on a similar, but more complex task of timed visual motor sequencing (Trailmaking Test Part B) was within the moderately impaired range with a T score of 26.  She made only two sequencing errors on this latter test.   Taken together, this pattern of performance is not indicative of a patient who is at  increased risk for day-to-day problems with executive functioning.  Psychomotor slowing is, however, noted.       Motor speed for finger tapping was within the normal range bilaterally.  Fine motor dexterity, as assessed by the [facility], was within the moderately to severely impaired range bilaterally.  This is a mixed pattern of performance with respect to motor skills.       The patient rated her current level of pain as "2/5 Discomforting" on a pain questionnaire.  She reported pain in her right temple, left shoulder,  neck, elbows, wrists, lower back, left buttock,  knees, and ankles.     . Her Beck Depression Inventory ???II score of 5 was within the minimally depressed range.  Her Beck Anxiety Inventory score of 3 reflected minimal anxiety.       The patient was also administered the Personality Assessment Inventory and review of the validity scales revealed that she did not attend appropriately to item content so no further interpretation is offered.      Impressions & Recommendations:  This patient generated a mixed normal/abnormal range Neuropsychological Evaluation with respect to neurocognitive functioning.  In this regard, she is showing problems with verbal fluency, sustained attention, processing speed, and bilateral motor dexterity.  Her visual memory is weaker than her auditory memory but her performance on that test was somewhat unusual.  Otherwise, her performance across all other neurocognitive domains assessed, including mental status, confrontation naming, auditory learning, auditory memory, verbal comprehension, executive functioning, and bilateral motor speed were normal.  Emotionally, the patient reported minimal depression and anxiety on two brief self-report questionnaires (despite the history of depression and anxiety symptoms she reported during her initial visit with me).  Lengthier assessment of personality functioning could not be interpreted because she did not  attend appropriately to item content.       In my opinion, this profile is consistent with selected cognitive deficits which are mostly attentional in nature though motor skills have been impacted as well.  These scores do reflect a change from estimated premorbid levels of functioning.  At the same time, her memory is essentially normal and I see no evidence of dementia.  I also see support for adjustment related emotional distress, and there is likely to be a strong psychological component to her physical pain.  In addition to continued medical care, my recommendations include consideration for an appropriate non stimulant medication for attention if this is not medically contraindicated.  She may benefit from an OT consult with respect to bilateral motor dexterity issues.  The remainder of the profile is mostly normal.  Mental health counseling is also strongly advised to address the functional interference.  She can continue Cymbalta if not medically contraindicated.  I am not concerned about competency.  I hope that she is relieved by the positive nature of these test results and hopefully something for attention can assist with her day-to-day neurocognitive functioning.  I agree with her continued disability status.  Clinical correlation is, of course, indicated.     I will discuss these findings with the patient when she follows up with me in the near future.  A follow up Neuropsychological Evaluation is indicated on a prn basis, especially if there are any cognitive and/or emotional changes.      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  If there is any additional information of which I am currently unaware, I would be more than happy to review  it upon having it made available to me.  Thank you for the opportunity to see this interesting individual.     Sincerely,       Addie Cederberg A. Wynonia Hazard, PsyD, EdS    CC: Janit Pagan, NP, Dr. Jettie Pagan, Levada Dy,  NP      2 units -[id number]  Record review.  Review of history provided by patient.  Review of collaborative information.  Testing by Clinician.  Review of raw data. Scoring. Report writing of individual tests administered by Clinician.  Integration of individual tests administered by psychometrist (that were previously reported and billed under psychometry code below) with testing by clinician and review of records/history/collaborative information.  Case Conceptualization, Report writing.  Coordination Of Care.     4 units  -[id number] - Psychometrist test prep, administration, and scoring under clinician's direct personal supervision.  Clinical interpretation of individual tests administered by psychometrist .  Clinician report of individual tests administered by psychometrist.    "Unit" is defined by CPT/National Guidelines (31 - 60 minutes).  Integral services including scoring of raw data, data interpretation, case conceptualization, report writing etcetera were initiated after the patient finished testing/raw data collected and was completed on the date the report was signed.

## 2013-05-03 NOTE — Progress Notes (Signed)
05/03/2013      Name:  Katherine [name]  DOB:  1955/09/15  MRN:  [id number]     PCP:  Katherine Pagan, NP    Chief Complaint   Patient presents with   ??? Follow Up Chronic Condition   ??? Tremors         HISTORY OF PRESENT ILLNESS: [name] presents today for follow up on Neuro Psych testing results.  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  Patient relieved to not have dementia.  Just states that it seems like her life is "out of order"  Patient also shows superficial abrasions to arms from scratching when is anxious or nervous - applying benadryl cream to arms. Mother in surgery at this time for Knee surgery but has heart issues so is extra anxious at this visit due to worry. Discussed at length need to address psychological issues with anticipation of relief of other symptoms of head pain, skin abrasions, cognitive difficulties, etc. Understanding verbalized and patient willing to seek treatment.  Currently take Valium for Meniere's disease and when she feels episode coming on she takes this - at this time advised that she take when gets increased anxiety or is scratching her arms in excess, etc. Discussed non stimulant attention treatment and will start at this time.  Expressed need for psychiatric management of her treatment due to possibly needing multiple medications. Patient stated that approaching 4 year anniversary of husbands death and knows that is when all of this started for her.  Denies any current cardiac, respiratory and/or bowel/bladder concerns.  Denies having HA today.      Review of Systems -A complete ROS was negative except HPI.     Current Outpatient Prescriptions   Medication Sig   ??? ondansetron (ZOFRAN ODT) 4 mg disintegrating tablet Take 1 Tab by mouth every eight (8) hours as needed for Nausea.   ??? primidone (MYSOLINE) 50 mg tablet Take  1 Tab by mouth three (3) times daily.   ??? topiramate (TOPAMAX) 25 mg tablet Take 100 mg by mouth nightly as needed.   ??? topiramate (TOPAMAX) 200 mg tablet Take 200 mg by mouth every morning.   ??? Cetirizine (ZYRTEC) 10 mg Cap Take 10 mg by mouth.   ??? ketorolac (TORADOL) 10 mg tablet Take 10 mg by mouth.   ??? hydroxychloroquine (PLAQUENIL) 200 mg tablet Take 200 mg by mouth daily.   ??? meloxicam (MOBIC) 15 mg tablet Take 15 mg by mouth daily.   ??? butalbital-acetaminophen-caffeine (FIORICET) 50-325-40 mg per tablet Take 1-2 Tabs by mouth every four (4) hours as needed for Pain.   ??? prochlorperazine (COMPAZINE) 10 mg tablet Take 1 Tab by mouth every eight (8) hours as needed for Nausea.   ??? potassium chloride SR (KLOR-CON 10) 10 mEq tablet Take 1 Tab by mouth daily.   ??? gabapentin (NEURONTIN) 300 mg capsule Take 600 mg by mouth three (3) times daily.   ??? valacyclovir (VALTREX) 1 g tablet Take 1,000 mg by mouth daily.   ??? duloxetine (CYMBALTA) 60 mg capsule Take 60 mg by mouth two (2) times a day.   ??? triamterene-hydrochlorothiazide (DYAZIDE) 37.5-25 mg per capsule Take 1 Cap by mouth daily.   ??? hydrocodone-acetaminophen (LORTAB) 5-500 mg per tablet Take 1 Tab by mouth every eight (8) hours as needed.   ??? ergocalciferol (VITAMIN D) 50,000 unit capsule Take  50,000 Units by mouth every seven (7) days.   ??? cyclobenzaprine (FLEXERIL) 10 mg tablet Take 10 mg by mouth two (2) times a day.   ??? diazepam (VALIUM) 5 mg tablet Take 5 mg by mouth every four (4) hours.   ??? azelastine (ASTELIN) 137 mcg nasal spray 1 Spray by Nasal route two (2) times a day. Administer to right and left nostril.   ??? butalbital-acetaminophen-caffeine (FIORICET) 50-325-40 mg per tablet Take 2 Tabs by mouth every four (4) hours as needed.   ??? prednisoLONE acetate (PRED FORTE) 1 % ophthalmic suspension Administer 1 Drop to right eye daily.     No current facility-administered medications for this visit.     Allergies   Allergen Reactions   ??? Morphine Other  (comments)     "it does not stop the pain"   ??? Other Medication Other (comments)     Pt must have low sodium fluid because it will cause an attack of the mineres   ??? Pcn [Penicillins] Rash and Swelling     Past Medical History   Diagnosis Date   ??? Other ill-defined conditions      shingles, lupus, menieres, Fibromyalgia   ??? Autoimmune disease    ??? Chronic pain    ??? Depression    ??? Arthritis    ??? Headache    ??? Burning with urination    ??? Bowel disease    ??? Sweats, menopausal    ??? Night sweats    ??? Visual loss    ??? Hearing loss    ??? Diarrhea    ??? Dizziness    ??? Numbness    ??? Easy bruising      Past Surgical History   Procedure Laterality Date   ??? Hx gyn       hysterectomy   ??? Hx orthopaedic       back x2, right knee   ??? Hx urological       bladder surgery   ??? Hx transplant       right corona   ??? Hx heent       right eye corona transplant, both ears, tonsillectomy   ??? Hx cholecystectomy     ??? Hx appendectomy     ??? Pr abdomen surgery proc unlisted       1 foot of colon removed   ??? Hx hernia repair       History     Social History   ??? Marital Status: WIDOWED     Spouse Name: N/A     Number of Children: N/A   ??? Years of Education: N/A     Occupational History   ??? Not on file.     Social History Main Topics   ??? Smoking status: Never Smoker    ??? Smokeless tobacco: Not on file   ??? Alcohol Use: No   ??? Drug Use: Not on file   ??? Sexually Active: Not on file     Other Topics Concern   ??? Not on file     Social History Narrative   ??? No narrative on file     Family History   Problem Relation Age of Onset   ??? Heart Disease Mother    ??? Cancer Mother      breast cancer   ??? Hypertension Father    ??? Diabetes Father    ??? Heart Disease Sister        PHYSICAL EXAMINATION:    Visit Vitals  Item Reading   ??? BP 130/86   ??? Pulse 202   ??? Ht 5\' 7"  (1.702 m)   ??? Wt 205 lb (92.987 kg)   ??? BMI 32.1 kg/m2   ??? SpO2 86%     General:  Well defined, nourished, and groomed individual.    Neck: Supple, nontender, no bruits, no pain with resistance to  active range of motion.    Heart: Regular rate and rhythm, no murmurs, rub, or gallop.  Normal S1S2.  Lungs:  Clear to auscultation bilaterally with equal chest expansion, no cough, no wheeze  Musculoskeletal:  Extremities revealed no edema and had full range of motion of joints.    Psych:  Anxious worried mood with periods of crying    NEUROLOGICAL EXAMINATION:     Mental Status:   Alert and oriented to person, place, and time with recent and remote memory intact.  Attention span and concentration are normal. Speech is fluent with a full fund of knowledge.      Cranial Nerves:    II-XII: Grossly intact.       Motor Examination: Normal tone, bulk, and strength, 5/5 muscle strength throughout.      Sensory exam:  Normal throughout to LT.      Coordination:  No resting or intention tremor    Gait and Station:  Steady while walking.  Normal arm swing. No muscle wasting or fasiculations noted.      Reflexes:  DTRs 2+ throughout.      ASSESSMENT AND PLAN    ICD-9-CM    1. 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 294.8 atomoxetine (STRATTERA) 40 mg capsule     REFERRAL TO PSYCHIATRY   2. Adjustment disorder with mixed anxiety and depressed mood 309.28 atomoxetine (STRATTERA) 40 mg capsule     REFERRAL TO PSYCHIATRY   3. Other pain disorders related to psychological factors 307.89 atomoxetine (STRATTERA) 40 mg capsule     REFERRAL TO PSYCHIATRY     Strattera for non stimulant treatment of ADHD  Referral to Psychiatry  Continue all current medications as prescribed  Valium for anxiety as prescribed  Encourage daily mental and physical exercise  Follow up 6 months or based on psychiatric counseling      Signed: Aster Screws L. Azucena Cecil, FNP-BC  05/03/2013  8:33 AM

## 2013-05-06 NOTE — Telephone Encounter (Signed)
Pt would like a call from Diane when she can for personal reasons. Pt can bed reached at [phone]

## 2013-05-09 NOTE — Telephone Encounter (Signed)
Writer contacted patient whom was calling regarding her prescription. Patient stated that prescription had been filled and she no longer needed to speak with Levada Dy, FNP-BC

## 2013-11-21 ENCOUNTER — Encounter

## 2013-12-06 MED ORDER — STRATTERA 40 MG CAPSULE
40 mg | ORAL_CAPSULE | ORAL | Status: DC
Start: 2013-12-06 — End: 2014-01-09

## 2013-12-12 ENCOUNTER — Encounter

## 2014-01-02 ENCOUNTER — Encounter

## 2014-01-09 MED ORDER — CARBAMAZEPINE SR 200 MG 12 HR TAB
200 mg | ORAL_TABLET | Freq: Two times a day (BID) | ORAL | Status: DC
Start: 2014-01-09 — End: 2014-04-03

## 2014-01-09 MED ORDER — CARBAMAZEPINE SR 100 MG 12 HR TAB
100 mg | ORAL_TABLET | Freq: Two times a day (BID) | ORAL | Status: AC
Start: 2014-01-09 — End: 2014-02-08

## 2014-01-09 MED ORDER — ATOMOXETINE 80 MG CAP
80 mg | ORAL_CAPSULE | Freq: Every day | ORAL | Status: DC
Start: 2014-01-09 — End: 2014-02-06

## 2014-01-09 MED ORDER — ATOMOXETINE 80 MG CAP
80 mg | ORAL_CAPSULE | Freq: Every day | ORAL | Status: DC
Start: 2014-01-09 — End: 2017-03-04

## 2014-01-09 NOTE — Patient Instructions (Addendum)
Topamax- take 1/2 pill in morning and 1 pill at night x 1 week    Then 1/2 pill in morning and 1/2 pill at night x1 week    Then 1/2 pill at night x 1 week and then start    Tegretol - First 30 days take 100 mg twice a day (sent to local [facility])    After first 30 days prescription will change to 200 mg twice a day (sent to mail   order)    Strattera - increase to 80 mg daily - finish current prescription by taking 2 in the morning, I   have [facility] day supply to [facility] and a 90 day supply to mail order.    Gabapentin - take 3 times a day for head pain and neuropathic (nerve) pain    At 3 month follow up - will evaluate and determine need for possibly adding Wellbutrin      Carbamazepine (By mouth)   Carbamazepine ([id number]-ba-MAZ-[id number]-peen)  Treats seizures, nerve pain, or bipolar disorder. Facial nerve pain  Brand Name(s):Carbatrol, Epitol, Equetro, TEGretol, TEGretol XR, TEGretol-XR   There may be other brand names for this medicine.  When This Medicine Should Not Be Used:   This medicine is not right for everyone. Do not use it if you had an allergic reaction to carbamazepine or a tricyclic antidepressant, if you are pregnant, or if you have a history of bone marrow depression.  How to Use This Medicine:   Long Acting Capsule, Liquid, Tablet, Chewable Tablet, Long Acting Tablet  ?? Take your medicine as directed. Your dose may need to be changed several times to find what works best for you. This medicine may be used alone or together with other seizure medicines.  ?? Capsule:   ?? Swallow the capsule whole. Do not break, crush, or chew it.  ?? You may open the capsule and pour the medicine into a small amount of soft food such as pudding, yogurt, or applesauce. Stir this mixture well and swallow it without chewing.  ?? Chewable tablet:   ?? It is best to take this medicine with food or milk.  ?? Chew the tablet well before swallowing it.  ?? Liquid:   ?? It is best to take this medicine with food or milk.  ?? Measure the oral liquid  medicine with a marked measuring spoon, oral syringe, or medicine cup. Shake the bottle well just before each use.  ?? Do not take this medicine at the same time as other oral liquid medicines.  ?? Tablet:   ?? It is best to take this medicine with food or milk.  ?? Swallow the tablet whole. Do not break, crush, or chew it.  ?? Do not use an extended-release tablet that is cracked or chipped.  ?? This medicine should come with a Medication Guide. Ask your pharmacist for a copy if you do not have one.  ?? Missed dose: Take a dose as soon as you remember. If it is almost time for your next dose, wait until then and take a regular dose. Do not take extra medicine to make up for a missed dose.  ?? Store the medicine in a closed container at room temperature, away from heat, moisture, and direct light.  Drugs and Foods to Avoid:   Ask your doctor or pharmacist before using any other medicine, including over-the-counter medicines, vitamins, and herbal products.  ?? Do not use this medicine together with nefazodone, delavirdine, or certain other  medicines for HIV or AIDS (such as efavirenz, etravirine). Do not use this medicine if you have used an MAO inhibitor within the past 14 days.  ?? The list below includes some of the most commonly used medicines that can interact with carbamazepine. There are many other drugs not listed. Make sure your doctor knows the names of all the medicines you use. Tell your doctor if you are taking any of the following:   ?? Alprazolam, aprepitant, aripiprazole, buspirone, citalopram, clarithromycin, clonazepam, clozapine, cyclophosphamide, cyclosporine, diltiazem, doxorubicin, erythromycin, everolimus, felodipine, fluoxetine, imatinib, isoniazid, lamotrigine, lapatinib, mefloquine, methadone, praziquantel, propoxyphene, quetiapine, quinine, sirolimus, tacrolimus, temsirolimus, tramadol, trazodone, triazolam, verapamil, or zileuton  ?? Antifungal medicines (fluconazole, itraconazole, ketoconazole),  protease inhibitors (atazanavir, darunavir, fosamprenavir, indinavir, lopinavir, ritonavir, saquinavir), or steroids (dexamethasone, prednisolone, prednisone)  ?? Do not eat grapefruit or drink grapefruit juice while you are using this medicine.  ?? Tell your doctor if you use anything else that makes you sleepy. Some examples are allergy medicine, narcotic pain medicine, and alcohol.  Warnings While Using This Medicine:   ?? It is not safe to take this medicine during pregnancy. It could harm an unborn baby. Tell your doctor right away if you become pregnant.  ?? Birth control pills, shots, and other hormonal birth control methods may not work as well while you are using carbamazepine. Use a second form of birth control to prevent pregnancy.  ?? Tell your doctor if you are breastfeeding or if you have kidney disease, liver disease, heart disease or rhythm problems, low white blood cell or platelet counts, glaucoma, low sodium levels, porphyria, an intolerance to fructose, or a history of suicidal thoughts or depression. Tell your doctor if you had an allergic reaction to any other medicine (especially seizure medicines).  ?? Tell your doctor if you have Asian ancestry. Your doctor may test you for serious skin reactions before prescribing this medicine.  ?? This medicine may cause the following problems:  ?? Serious skin reactions  ?? Aplastic anemia or other blood problems  ?? Multiorgan hypersensitivity (DRESS syndrome)  ?? Suicidal thoughts or behavior  ?? Do not stop using this medicine suddenly. Your doctor will need to slowly decrease your dose before you stop it completely.  ?? Tell any doctor or dentist who treats you that you are using this medicine. This medicine may affect certain medical test results.  ?? This medicine may make you dizzy or drowsy. Do not drive or do anything else that could be dangerous until you know how this medicine affects you.  ?? Your doctor will do lab tests at regular visits to check on the  effects of this medicine. Keep all appointments. Your doctor may want to have your eyes checked by an eye doctor.  ?? Keep all medicine out of the reach of children. Never share your medicine with anyone.  Possible Side Effects While Using This Medicine:   Call your doctor right away if you notice any of these side effects:  ?? Allergic reaction: Itching or hives, swelling in your face or hands, swelling or tingling in your mouth or throat, chest tightness, trouble breathing  ?? Blistering, peeling, or red skin rash  ?? Change in how much or how often you urinate  ?? Chest pain, trouble breathing, cold sweats, bluish skin  ?? Confusion, memory problems, unusual tiredness, muscle spasms or weakness  ?? Dark urine or pale stools, nausea, vomiting, loss of appetite, stomach pain, yellow skin or eyes  ?? Fast, slow, or  pounding heartbeat  ?? Fever, chills, cough, sore throat, or sores in your mouth  ?? Lightheadedness or fainting  ?? Swollen, painful, or tender lymph glands in your neck, armpit, or groin  ?? Thoughts of hurting yourself or others, unusual thoughts or behavior  ?? Unusual bleeding, bruising, or weakness  If you notice these less serious side effects, talk with your doctor:   ?? Anxiety, agitation, depression, restlessness, or trouble sleeping  ?? Blurred vision  ?? Nausea, vomiting, constipation  ?? Dizziness or drowsiness  ?? Dry mouth  If you notice other side effects that you think are caused by this medicine, tell your doctor.   Call your doctor for medical advice about side effects. You may report side effects to FDA at 1-800-FDA-1088  ?? 2014 [employer] is for End User's use only and may not be sold, redistributed or otherwise used for commercial purposes.  The above information is an educational aid only. It is not intended as medical advice for individual conditions or treatments. Talk to your doctor, nurse or pharmacist before following any medical regimen to see if it is safe and  effective for you.      [facility]   Statement to Patients  November 23, 2012      In an effort to ensure the large volume of patient prescription refills is processed in the most efficient and expeditious manner, we are asking our patients to assist [place] by calling your Pharmacy for all prescription refills, this will include also your  Mail Order Pharmacy. The pharmacy will contact our office electronically to continue the refill process.    Please do not wait until the last minute to call your pharmacy. We need at least 48 hours (2days) to fill prescriptions. We also encourage you to call your pharmacy before going to pick up your prescription to make sure it is ready.     With regard to controlled substance prescription refill requests (narcotic refills) that need to be picked up at our office, we ask your cooperation by providing [place] with at least 72 hours (3days) notice that you will need a refill.    We will not refill narcotic prescription refill requests after 4:00pm on any weekday, Monday through Thursday, or after 2:00pm on Fridays, or on the weekends.      We encourage everyone to explore another way of getting your prescription refill request processed using MyChart, our patient web portal through our electronic medical record system. MyChart is an efficient and effective way to communicate your medication request directly to the office and  downloadable as an app on your smart phone . MyChart also features a review functionality that allows you to view your medication list as well as leave messages for your physician. Are you ready to get connected? If so please review the attatched instructions or speak to any of our staff to get you set up right away!    Thank you so much for your cooperation. Should you have any questions please contact our Engineer, building services.    The Physicians and Staff,  [facility]

## 2014-01-09 NOTE — Progress Notes (Signed)
01/09/2014      Name:  Katherine [name]  DOB:  1955-10-29  MRN:  [id number]     PCP:  Katherine Cole, NP    Chief Complaint   Patient presents with   ??? Medication Evaluation         HISTORY OF PRESENT ILLNESS:  Katherine [name] presents today for medication evaluation for treatment on TGN and HA. Patient was started on Stratera at last visit and referred to psychiatry based on NeuroPscyh results. Patient feels that she should have come sooner because doesn't think medicine is helping with organization and focus. Did run out of medications for management of HA and now HA are more frequent. Location is in same location as shingles - R eyebrow, temple. Describes as if runs fingers under eye and cheek feels like a thousand tingles which then will progress to head pain. Lupus is in a flare up right now as well. 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 Denies any current cardiac, respiratory and/or bowel/bladder changes/concerns.  Denies any visual disturbances.    Review of Systems - A complete ROS was negative except HPI.     Current Outpatient Prescriptions   Medication Sig   ??? STRATTERA 40 mg capsule TAKE ONE CAPSULE EVERY DAY   ??? ondansetron (ZOFRAN ODT) 4 mg disintegrating tablet Take 1 Tab by mouth every eight (8) hours as needed for Nausea.   ??? primidone (MYSOLINE) 50 mg tablet Take 1 Tab by mouth three (3) times daily.   ??? topiramate (TOPAMAX) 25 mg tablet Take 100 mg by mouth nightly as needed.   ??? topiramate (TOPAMAX) 200 mg tablet Take 200 mg by mouth every morning.   ??? Cetirizine (ZYRTEC) 10 mg Cap Take 10 mg by mouth.   ??? ketorolac (TORADOL) 10 mg tablet Take 10 mg by mouth.   ??? hydroxychloroquine (PLAQUENIL) 200 mg tablet Take 200 mg by mouth daily.   ??? meloxicam (MOBIC) 15 mg tablet Take 15 mg by mouth daily.   ??? butalbital-acetaminophen-caffeine (FIORICET) 50-325-40 mg per tablet Take 1-2 Tabs by mouth every four (4) hours as needed for Pain.   ??? prochlorperazine (COMPAZINE) 10 mg tablet Take 1 Tab by  mouth every eight (8) hours as needed for Nausea.   ??? potassium chloride SR (KLOR-CON 10) 10 mEq tablet Take 1 Tab by mouth daily.   ??? gabapentin (NEURONTIN) 300 mg capsule Take 600 mg by mouth three (3) times daily.   ??? valacyclovir (VALTREX) 1 g tablet Take 1,000 mg by mouth daily.   ??? duloxetine (CYMBALTA) 60 mg capsule Take 60 mg by mouth two (2) times a day.   ??? triamterene-hydrochlorothiazide (DYAZIDE) 37.5-25 mg per capsule Take 1 Cap by mouth daily.   ??? hydrocodone-acetaminophen (LORTAB) 5-500 mg per tablet Take 1 Tab by mouth every eight (8) hours as needed.   ??? ergocalciferol (VITAMIN D) 50,000 unit capsule Take 50,000 Units by mouth every seven (7) days.   ??? cyclobenzaprine (FLEXERIL) 10 mg tablet Take 10 mg by mouth two (2) times a day.   ??? diazepam (VALIUM) 5 mg tablet Take 5 mg by mouth every four (4) hours.   ??? azelastine (ASTELIN) 137 mcg nasal spray 1 Spray by Nasal route two (2) times a day. Administer to right and left nostril.   ??? butalbital-acetaminophen-caffeine (FIORICET) 50-325-40 mg per tablet Take 2 Tabs by mouth every four (4) hours as needed.   ??? prednisoLONE acetate (PRED FORTE) 1 % ophthalmic suspension Administer 1 Drop  to right eye daily.     No current facility-administered medications for this visit.     Allergies   Allergen Reactions   ??? Morphine Other (comments)     "it does not stop the pain"   ??? Other Medication Other (comments)     Pt must have low sodium fluid because it will cause an attack of the mineres   ??? Pcn [Penicillins] Rash and Swelling     Past Medical History   Diagnosis Date   ??? Other ill-defined conditions(799.89)      shingles, lupus, menieres, Fibromyalgia   ??? Autoimmune disease ([place])    ??? Chronic pain    ??? Depression    ??? Arthritis    ??? Headache(784.0)    ??? Burning with urination    ??? Bowel disease    ??? Sweats, menopausal    ??? Night sweats    ??? Visual loss    ??? Hearing loss    ??? Diarrhea    ??? Dizziness    ??? Numbness    ??? Easy bruising      Past Surgical History    Procedure Laterality Date   ??? Hx gyn       hysterectomy   ??? Hx orthopaedic       back x2, right knee   ??? Hx urological       bladder surgery   ??? Hx transplant       right corona   ??? Hx heent       right eye corona transplant, both ears, tonsillectomy   ??? Hx cholecystectomy     ??? Hx appendectomy     ??? Pr abdomen surgery proc unlisted       1 foot of colon removed   ??? Hx hernia repair       History     Social History   ??? Marital Status: WIDOWED     Spouse Name: N/A     Number of Children: N/A   ??? Years of Education: N/A     Occupational History   ??? Not on file.     Social History Main Topics   ??? Smoking status: Never Smoker    ??? Smokeless tobacco: Not on file   ??? Alcohol Use: No   ??? Drug Use: Not on file   ??? Sexual Activity: Not on file     Other Topics Concern   ??? Not on file     Social History Narrative     Family History   Problem Relation Age of Onset   ??? Heart Disease Mother    ??? Cancer Mother      breast cancer   ??? Hypertension Father    ??? Diabetes Father    ??? Heart Disease Sister        PHYSICAL EXAMINATION:    Visit Vitals   Item Reading   ??? BP 102/70   ??? Pulse 84   ??? Ht 5\' 7"  (1.702 m)   ??? Wt 205 lb (92.987 kg)   ??? BMI 32.10 kg/m2   ??? SpO2 98%     General appearance: alert, cooperative, no distress, appears stated age  Head: Normocephalic, without obvious abnormality, atraumatic, sinuses nontender to percussion  Eyes: conjunctivae/corneas clear. PERRL, EOM's intact. Fundi benign  Lungs: clear to auscultation bilaterally  Heart: regular rate and rhythm  Extremities: extremities normal, atraumatic, no cyanosis or edema  Neurologic: CN II-XII intact. Alert and oriented X 3, normal strength and  tone. Normal to LT. Normal symmetric reflexes. Normal coordination and gait. Normal Romberg.    ASSESSMENT AND PLAN    ICD-9-CM    1. Trigeminal neuralgia of right side of face 350.1 carBAMazepine XR (TEGRETOL XR) 100 mg SR tablet     carBAMazepine XR (TEGRETOL XR) 200 mg SR tablet   2. Unspecified hereditary and  idiopathic peripheral neuropathy 356.9 carBAMazepine XR (TEGRETOL XR) 100 mg SR tablet     carBAMazepine XR (TEGRETOL XR) 200 mg SR tablet   3. Head and face pain 784.0 carBAMazepine XR (TEGRETOL XR) 100 mg SR tablet     carBAMazepine XR (TEGRETOL XR) 200 mg SR tablet   4. 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 Attention deficit hyperactivity disorder (ADHD), predominantly inattentive type 314.01 atomoxetine (STRATTERA) 80 mg capsule     atomoxetine (STRATTERA) 80 mg capsule     Strattera increased to 80 mg daily for management of ADHD - to discuss with psychiatry  Will remain off of Topamax for HA/TGN  Tegretol XR for management of TGN - to titrate up to 200 mg BID- S/E discussed verbally and provided in writing  Strongly recommend Psychiatry to evaluate/manage ADHD and psychological concerns  Continue all other medications as prescribed  Encourage daily mental and physical exercise  Follow up 6 months    Signed: Jesika Men L. Kalman Shan, FNP-BC  01/09/2014  8:10 AM

## 2014-01-11 NOTE — Procedures (Signed)
[place]                        [place], [place]      Name:      Katherine [name], Katherine [name] [facility]  Service Date:    01/11/2014  DOB:       02/14/1956               Ordered by:                                      Location:  Sex:       F                        Age:             58  Billing #: [id number]             Date of Adm:     01/11/2014                              HOLTER MONITOR ANALYSIS      PROCEDURE PERFORMED: Holter monitor.    FINDINGS: Holter monitor was carried out between 01/11/2014 and 01/12/2014.  The patient had a minimum heart rate of 64 beats per minute, with an  average heart rate of 82 beats per minute. The maximum heart was 105 beats  per minute. There were no ventricular or atrial abnormal beats. Sinus  rhythm was observed throughout the recording. The patient did return a  diary. The diary indicated activities, but no significant cardiac  symptoms.    SUMMARY: Regular sinus rhythm is observed throughout the recording. Two   APCs. No rhythm disturbance was observed.        Reviewed on 01/13/2014 2:47 PM        Brayton Mars, MD    cc:    Brayton Mars, MD    SCT/wmx; D:  01/13/2014 08:55 A; T:  01/13/2014 01:35 P; DOC# [id number]; JOB#  [id number]

## 2014-01-13 NOTE — Procedures (Signed)
[place]                        [place], [place]      Name:      Katherine [name], Katherine [name] [facility]  Service Date:    01/11/2014  DOB:       1955/11/18               Ordered by:                                      Location:  Sex:       F                        Age:             58  Billing #: [id number]             Date of Adm:     01/11/2014                              HOLTER MONITOR ANALYSIS      PROCEDURE PERFORMED: Holter monitor.    FINDINGS: Holter monitor was carried out between 01/11/2014 and 01/12/2014.  The patient had a minimum heart rate of 64 beats per minute, with an  average heart rate of 82 beats per minute. The maximum heart was 105 beats  per minute. There were no ventricular or atrial abnormal beats. Sinus  rhythm was observed throughout the recording. The patient did return a  diary. The diary indicated activities, but no significant cardiac  symptoms.    SUMMARY: Regular sinus rhythm is observed throughout the recording. Two   APCs. No rhythm disturbance was observed.        Reviewed on 01/13/2014 2:47 PM        Brayton Mars, MD    cc:    Brayton Mars, MD    SCT/wmx; D:  01/13/2014 08:55 A; T:  01/13/2014 01:35 P; DOC# [id number]; JOB#  [id number]

## 2014-02-06 MED ORDER — STRATTERA 80 MG CAPSULE
80 mg | ORAL_CAPSULE | ORAL | Status: DC
Start: 2014-02-06 — End: 2014-04-03

## 2014-02-13 MED ORDER — STRATTERA 80 MG CAPSULE
80 mg | ORAL_CAPSULE | ORAL | Status: DC
Start: 2014-02-13 — End: 2014-04-03

## 2014-04-03 NOTE — Patient Instructions (Addendum)
Tegretol XR Weaning:    Take 1 tablet at night for 1 week       Take 1/2 tablet at night for 1 week then stop    Follow up with PCP for alternative to Strattera until able to have appt with Psychiatry    Follow up with Psychiatry based on Neuro Psych Testing Results.      [facility]   Statement to Patients  November 23, 2012      In an effort to ensure the large volume of patient prescription refills is processed in the most efficient and expeditious manner, we are asking our patients to assist [place] by calling your Pharmacy for all prescription refills, this will include also your  Mail Order Pharmacy. The pharmacy will contact our office electronically to continue the refill process.    Please do not wait until the last minute to call your pharmacy. We need at least 48 hours (2days) to fill prescriptions. We also encourage you to call your pharmacy before going to pick up your prescription to make sure it is ready.     With regard to controlled substance prescription refill requests (narcotic refills) that need to be picked up at our office, we ask your cooperation by providing [place] with at least 72 hours (3days) notice that you will need a refill.    We will not refill narcotic prescription refill requests after 4:00pm on any weekday, Monday through Thursday, or after 2:00pm on Fridays, or on the weekends.      We encourage everyone to explore another way of getting your prescription refill request processed using MyChart, our patient web portal through our electronic medical record system. MyChart is an efficient and effective way to communicate your medication request directly to the office and  downloadable as an app on your smart phone . MyChart also features a review functionality that allows you to view your medication list as well as leave messages for your physician. Are you ready to get connected? If  so please review the attatched instructions or speak to any of our staff to get you set up right away!    Thank you so much for your cooperation. Should you have any questions please contact our Engineer, building services.    The Physicians and Staff,  [facility]

## 2014-04-03 NOTE — Progress Notes (Signed)
04/03/2014      Name:  Katherine [name]  DOB:  12-10-1955  MRN:  [id number]     PCP:  Adalberto Cole, NP (General)    Chief Complaint   Patient presents with   ??? Follow-up     medication         HISTORY OF PRESENT ILLNESS: Katherine [name] presents today for follow up on head pain, TGN, and medication management. Patient last seen 12/2013 with referral to Psychiatry provided for a second time for medication management of ADHD, depression, anxiety, etc. Patient has not been to Psychiatry as referred stating she did not remember. Patient does not feel that Christianne Borrow is helping her - discussed with patient that she needs to follow with Psychiatry or PCP for management of this and other psychiatric concerns as noted in Neuro Psych testing results.  Discussed that we can manage her neurologic conditions but not Psychologic condition.  Patient reports that the increase to Tegretol XR 200 mg BID caused constipation with eventual chest pain - evaluated by Dr. Berline Lopes, Cardiology with no abnormalities noted.  Patient was then seen by GI with addition of medications to help with motility with a scheduled colonoscopy. Patient states head hurts all the time but vague as to location, duration, description.  Patient states she hurts all over so hard for her to describe just her head.  Patient states she shakes all the time - though not during today's visit or during last 2 visits, she states it occurs all the time.      Review of Systems - Pertinent ROS noted in HPI.     Current Outpatient Prescriptions   Medication Sig   ??? carBAMazepine XR (TEGRETOL XR) 200 mg SR tablet Take 1 Tab by mouth two (2) times a day.   ??? atomoxetine (STRATTERA) 80 mg capsule Take 1 Cap by mouth daily.   ??? primidone (MYSOLINE) 50 mg tablet Take 1 Tab by mouth three (3) times daily.   ??? ketorolac (TORADOL) 10 mg tablet Take 10 mg by mouth.   ??? hydroxychloroquine (PLAQUENIL) 200 mg tablet Take 200 mg by mouth daily.    ??? meloxicam (MOBIC) 15 mg tablet Take 15 mg by mouth daily.   ??? prochlorperazine (COMPAZINE) 10 mg tablet Take 1 Tab by mouth every eight (8) hours as needed for Nausea.   ??? potassium chloride SR (KLOR-CON 10) 10 mEq tablet Take 1 Tab by mouth daily.   ??? gabapentin (NEURONTIN) 300 mg capsule Take 600 mg by mouth three (3) times daily.   ??? valacyclovir (VALTREX) 1 g tablet Take 1,000 mg by mouth daily.   ??? duloxetine (CYMBALTA) 60 mg capsule Take 60 mg by mouth two (2) times a day.   ??? triamterene-hydrochlorothiazide (DYAZIDE) 37.5-25 mg per capsule Take 1 Cap by mouth daily.   ??? hydrocodone-acetaminophen (LORTAB) 5-500 mg per tablet Take 1 Tab by mouth every eight (8) hours as needed.   ??? ergocalciferol (VITAMIN D) 50,000 unit capsule Take 50,000 Units by mouth every seven (7) days.   ??? cyclobenzaprine (FLEXERIL) 10 mg tablet Take 10 mg by mouth two (2) times a day.   ??? diazepam (VALIUM) 5 mg tablet Take 5 mg by mouth every six (6) hours as needed.   ??? azelastine (ASTELIN) 137 mcg nasal spray 1 Spray by Nasal route two (2) times a day. Administer to right and left nostril.   ??? butalbital-acetaminophen-caffeine (FIORICET) 50-325-40 mg per tablet Take 2 Tabs by mouth every four (4) hours  as needed.   ??? prednisoLONE acetate (PRED FORTE) 1 % ophthalmic suspension Administer 1 Drop to right eye daily.     No current facility-administered medications for this visit.     Allergies   Allergen Reactions   ??? Morphine Other (comments)     "it does not stop the pain"   ??? Other Medication Other (comments)     Pt must have low sodium fluid because it will cause an attack of the mineres   ??? Pcn [Penicillins] Rash and Swelling     Past Medical History   Diagnosis Date   ??? Other ill-defined conditions(799.89)      shingles, lupus, menieres, Fibromyalgia   ??? Autoimmune disease ([place])    ??? Chronic pain    ??? Depression    ??? Arthritis    ??? Headache(784.0)    ??? Burning with urination    ??? Bowel disease    ??? Sweats, menopausal     ??? Night sweats    ??? Visual loss    ??? Hearing loss    ??? Diarrhea    ??? Dizziness    ??? Numbness    ??? Easy bruising      Past Surgical History   Procedure Laterality Date   ??? Hx gyn       hysterectomy   ??? Hx orthopaedic       back x2, right knee   ??? Hx urological       bladder surgery   ??? Hx transplant       right corona   ??? Hx heent       right eye corona transplant, both ears, tonsillectomy   ??? Hx cholecystectomy     ??? Hx appendectomy     ??? Pr abdomen surgery proc unlisted       1 foot of colon removed   ??? Hx hernia repair       History     Social History   ??? Marital Status: WIDOWED     Spouse Name: N/A     Number of Children: N/A   ??? Years of Education: N/A     Occupational History   ??? Not on file.     Social History Main Topics   ??? Smoking status: Never Smoker    ??? Smokeless tobacco: Not on file   ??? Alcohol Use: No   ??? Drug Use: Not on file   ??? Sexual Activity: Not on file     Other Topics Concern   ??? Not on file     Social History Narrative     Family History   Problem Relation Age of Onset   ??? Heart Disease Mother    ??? Cancer Mother      breast cancer   ??? Hypertension Father    ??? Diabetes Father    ??? Heart Disease Sister        PHYSICAL EXAMINATION:    Visit Vitals   Item Reading   ??? BP 135/70 mmHg   ??? Pulse 85   ??? Ht 5\' 7"  (1.702 m)   ??? Wt 216 lb (97.977 kg)   ??? BMI 33.82 kg/m2   ??? SpO2 98%     General appearance: alert, cooperative, mild distress, slowed mentation, appears stated age  Head: Normocephalic, without obvious abnormality, atraumatic  Eyes: conjunctivae/corneas clear. PERRL, EOM's intact. Fundi benign  Lungs: spontaneous, unlabored respirations  Heart: regular rate and rhythm  Extremities: extremities normal, atraumatic, no cyanosis or  edema  Neurologic: Mental status: Alert, oriented, thought content appropriate, affect: depressed, sad with intermittent crying  Cranial nerves: II-XII intact  Sensory: Normal to LT and temperature  Motor:Normal strength, bulk and tone but has give way effort with BLE  stating sore all over  Reflexes: 2+ and symmetric  Coordination: No resting or intention tremor noted  Gait: Normal, unassisted gait    ASSESSMENT AND PLAN    ICD-9-CM ICD-10-CM    1. 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 Unspecified hereditary and idiopathic peripheral neuropathy 356.9 G60.9 REFERRAL TO PSYCHIATRY   3. Adjustment disorder with mixed anxiety and depressed mood 309.28 F43.23 REFERRAL TO PSYCHIATRY   4. 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 ADHD (attention deficit hyperactivity disorder), inattentive type 314.01 F90.0 REFERRAL TO PSYCHIATRY   6. Chronic head pain 784.0 R51 REFERRAL TO PSYCHIATRY    338.29 G89.29    7. Tremors of nervous system 781.0 R25.1       Discussed with patient at length the impact her psychological factors appear to be having on her pain and imperative she address these issues with Psychiatry  Wean off Tegretol due to S/E of constipation  Follow up with PCP for alternatives for Strattera until able to see Psychiatry or remain on Strattera dosing until able to see Psychiatry  Referral to Psychiatry  Cont Primidone for tremors  Cont Gabapentin for chronic generalized pain including Head Pain  Encourage daily MVI and Vit D  Encourage daily mental and physical exercise  Follow up 6 months    Signed: Weyman Croon, NP  04/03/2014  8:02 AM    This note will not be viewable in MyChart.

## 2014-04-19 ENCOUNTER — Inpatient Hospital Stay: Payer: MEDICARE

## 2014-04-19 LAB — POC H. PYLORI, TISSUE
H. pylori from tissue: NEGATIVE
Lot no.: 134114
Negative control: NEGATIVE
Positive control: POSITIVE

## 2014-04-19 MED ORDER — PROPOFOL 10 MG/ML IV EMUL
10 mg/mL | Freq: Once | INTRAVENOUS | Status: DC
Start: 2014-04-19 — End: 2014-04-19

## 2014-04-19 MED ORDER — SODIUM CHLORIDE 0.9 % IJ SYRG
INTRAMUSCULAR | Status: DC | PRN
Start: 2014-04-19 — End: 2014-04-19

## 2014-04-19 MED ORDER — SODIUM CHLORIDE 0.9 % IJ SYRG
Freq: Three times a day (TID) | INTRAMUSCULAR | Status: DC
Start: 2014-04-19 — End: 2014-04-19

## 2014-04-19 MED ORDER — SIMETHICONE 40 MG/0.6 ML ORAL DROPS, SUSP
40 mg/0.6 mL | ORAL | Status: DC | PRN
Start: 2014-04-19 — End: 2014-04-19

## 2014-04-19 MED ADMIN — ondansetron (ZOFRAN) injection: INTRAVENOUS | @ 15:00:00 | NDC 23155037831

## 2014-04-19 MED ADMIN — 0.9% sodium chloride infusion: INTRAVENOUS | @ 14:00:00 | NDC 00409798303

## 2014-04-19 MED ADMIN — propofol (DIPRIVAN) 10 mg/mL injection: INTRAVENOUS | @ 15:00:00 | NDC 00703285604

## 2014-04-19 MED ADMIN — lidocaine (PF) (XYLOCAINE) 20 mg/mL (2 %) injection: INTRAVENOUS | @ 15:00:00 | NDC 63323020805

## 2014-04-19 MED FILL — SODIUM CHLORIDE 0.9 % IV: INTRAVENOUS | Qty: 1000

## 2014-04-19 NOTE — Procedures (Signed)
Procedures  by Christin Bach, MD at 04/19/14 1112                Author: Christin Bach, MD  Service: Gastroenterology  Author Type: Physician       Filed: 04/19/14 1119  Date of Service: 04/19/14 1112  Status: Signed          Editor: Christin Bach, MD (Physician)            Pre-procedure Diagnoses        1. Chest pain, unspecified chest pain type [786.50]                           Post-procedure Diagnoses        1. Hiatal hernia [553.3]        2. Gastritis [535.50]                           Procedures        1. UPPER GI ENDOSCOPY,BIOPSY [[id number]]                                           Esophagogastroduodenoscopy Procedure Note         Katherine [name]   1955/09/20   [id number]      Indication:  Chest pain       Endoscopist: Christin Bach, MD      Referring Provider:  Adalberto Cole, NP      Sedation:  MAC anesthesia Propofol      Procedure Details:   After infomed consent was obtained for the procedure, with all risks and benefits of procedure explained the patient was taken to the endoscopy suite and placed in the left lateral decubitus position.  Following sequential administration of sedation as  per above, the endoscope was inserted into the mouth and advanced under direct vision to second portion of the duodenum.  A careful inspection was made as the gastroscope was withdrawn, including a retroflexed view of the proximal stomach; findings and  interventions are described below.        Findings:       Esophagus:    + There was scattered whitish exudate in the lower 1/2 of the esophagus s/p brushing to r/o Candida.   -  Remaining esophageal mucosa was normal s/p Bx of mid esophagus to r/o EoE and Z line as well at 38 cm from incisors.      Stomach:    +  The gastric mucosa appeared mildly erythematous. The fundus was found to be normal with no lesions noted on retroflexion. Cold forceps biopsies for CLO test and path to r/o H pylori.      Duodenum:    -  The bulb and post bulbar mucosa  is normal in appearance to the second portion. The duodenal folds appeared normal.  Cold forceps biopsies to r/o celiac.       Therapies:  above      Specimen: Specimens were collected as described and send to the laboratory.             Complications:   None were encountered during the procedure.      EBL:  < 10 ml.            Recommendations:    -f/u path  to r/o Candida and also H pylori   -acid suppression   Christin Bach, MD   04/19/2014  11:14 AM

## 2014-04-19 NOTE — Procedures (Signed)
Procedures  by Christin Bach, MD at 04/19/14 1119                Author: Christin Bach, MD  Service: Gastroenterology  Author Type: Physician       Filed: 04/19/14 1125  Date of Service: 04/19/14 1119  Status: Signed          Editor: Christin Bach, MD (Physician)            Pre-procedure Diagnoses        1. Change in bowel habits [787.99]                           Post-procedure Diagnoses        1. Diverticulosis of large intestine without hemorrhage [562.10]                           Procedures        1. COLONOSCOPY,DIAGNOSTIC [[id number]]                                           Colonoscopy Procedure Note      Indications: Change in bowel habits      Referring Physician: Adalberto Cole, NP   Anesthesia/Sedation: MAC anesthesia Propofol   Endoscopist:  Dr. Zane Herald      Procedure in Detail:   Informed consent was obtained for the procedure, including sedation.  Risks of perforation, hemorrhage, adverse drug reaction, and aspiration were discussed. The patient was placed in the left lateral decubitus position.  Based on the pre-procedure assessment,  including review of the patient's medical history, medications, allergies, and review of systems, she had been deemed to be an appropriate candidate  for moderate sedation; she was therefore sedated with the medications listed above.   The patient was monitored continuously with ECG tracing,  pulse oximetry, blood pressure monitoring, and direct observations.        A rectal examination was performed. The PCF160AL was inserted into the rectum and advanced under direct vision to the cecum, which was identified by the ileocecal valve and appendiceal orifice.  The quality of the colonic preparation was adequate.  A  careful inspection was made as the colonoscope was withdrawn, including a retroflexed view of the rectum; findings and interventions are described below.  Appropriate photodocumentation was obtained.      Findings:       1.  Scope  advanced to the cecum.   2.  Preparation adequate.   3.  Normal mucosa throughout s/p Bx of colon.   4.  Diffuse left sided diverticulosis.   5.  Small internal hemorrhoids.      Therapies:   Bx of colon      Specimen: Specimens were collected as described above and sent to pathology.       Complications: None were encountered during the procedure .       EBL: < 10 ml.      Recommendations:    -f/u path   -fiber supplement   -continue Linzess for constipation   -repeat colonoscopy in 10 years   Signed By: Christin Bach, MD                         April 19, 2014

## 2014-04-19 NOTE — Other (Signed)
Pt reports nausea subsided.

## 2014-04-19 NOTE — Procedures (Signed)
Esophagogastroduodenoscopy Procedure Note      Katherine [name]  23-Mar-1956  [id number]    Indication:  Chest pain     Endoscopist: Christin Bach, MD    Referring Provider:  Adalberto Cole, NP    Sedation:  MAC anesthesia Propofol    Procedure Details:  After infomed consent was obtained for the procedure, with all risks and benefits of procedure explained the patient was taken to the endoscopy suite and placed in the left lateral decubitus position.  Following sequential administration of sedation as per above, the endoscope was inserted into the mouth and advanced under direct vision to second portion of the duodenum.  A careful inspection was made as the gastroscope was withdrawn, including a retroflexed view of the proximal stomach; findings and interventions are described below.      Findings:     Esophagus:   + There was scattered whitish exudate in the lower 1/2 of the esophagus s/p brushing to r/o Candida.  -  Remaining esophageal mucosa was normal s/p Bx of mid esophagus to r/o EoE and Z line as well at 38 cm from incisors.    Stomach:   +  The gastric mucosa appeared mildly erythematous. The fundus was found to be normal with no lesions noted on retroflexion. Cold forceps biopsies for CLO test and path to r/o H pylori.    Duodenum:   -  The bulb and post bulbar mucosa is normal in appearance to the second portion. The duodenal folds appeared normal.  Cold forceps biopsies to r/o celiac.     Therapies:  above    Specimen: Specimens were collected as described and send to the laboratory.           Complications:   None were encountered during the procedure.    EBL:  < 10 ml.          Recommendations:   -f/u path to r/o Candida and also H pylori  -acid suppression  Christin Bach, MD  04/19/2014  11:14 AM

## 2014-04-19 NOTE — H&P (Signed)
Gastroenterology Outpatient History and Physical    Patient: Katherine [name]    Physician: Christin Bach, MD    Vital Signs: Blood pressure 139/89, pulse 80, resp. rate 16, height 5\' 7"  (1.702 m), weight 95.709 kg (211 lb), SpO2 99 %.    Allergies:   Allergies   Allergen Reactions   ??? Morphine Other (comments)     "it does not stop the pain"   ??? Other Medication Other (comments)     Pt must have low sodium fluid because it will cause an attack of the mineres   ??? Pcn [Penicillins] Rash and Swelling       Chief Complaint: H/O Colon polyps, chest pain with esophageal dysmotility    History of Present Illness: 58 yo WF for colonoscopy for h/o polyps.  Also with chest pain and questionable esophageal dysmotility.    Justification for Procedure: above    History:  Past Medical History   Diagnosis Date   ??? Other ill-defined conditions(799.89)      shingles, lupus, menieres, Fibromyalgia   ??? Autoimmune disease ([place])    ??? Chronic pain    ??? Depression    ??? Arthritis    ??? Headache(784.0)    ??? Burning with urination    ??? Bowel disease    ??? Sweats, menopausal    ??? Night sweats    ??? Visual loss    ??? Hearing loss    ??? Diarrhea    ??? Dizziness    ??? Numbness    ??? Easy bruising    ??? Difficult intubation      difficulty breathing after awakening in past.      Past Surgical History   Procedure Laterality Date   ??? Hx gyn       hysterectomy   ??? Hx transplant       right corona   ??? Hx heent       right eye corona transplant, both ears, tonsillectomy   ??? Hx appendectomy     ??? Hx urological       bladder surgery x2   ??? Hx orthopaedic       back x2, right knee   ??? Hx orthopaedic  01-2014      right hand surgery   ??? Hx cholecystectomy     ??? Pr abdomen surgery proc unlisted  1994     1 foot of colon removed   ??? Hx hernia repair        History     Social History   ??? Marital Status: WIDOWED     Spouse Name: N/A     Number of Children: N/A   ??? Years of Education: N/A     Social History Main Topics    ??? Smoking status: Former Smoker -- 0.25 packs/day for 30 years     Quit date: 11/16/2013   ??? Smokeless tobacco: Never Used   ??? Alcohol Use: No   ??? Drug Use: No   ??? Sexual Activity: Not on file     Other Topics Concern   ??? Not on file     Social History Narrative      Family History   Problem Relation Age of Onset   ??? Heart Disease Mother    ??? Cancer Mother      breast cancer   ??? Hypertension Father    ??? Diabetes Father    ??? Heart Disease Sister      Medications:   Prior to  Admission medications    Medication Sig Start Date End Date Taking? Authorizing Provider   linaclotide Rolan Lipa) 145 mcg cap capsule Take  by mouth Daily (before breakfast).   Yes Historical Provider   hydrOXYzine (ATARAX) 25 mg tablet Take  by mouth two (2) times a day.   Yes Historical Provider   atomoxetine (STRATTERA) 80 mg capsule Take 1 Cap by mouth daily. 01/09/14  Yes Weyman Croon, NP   primidone (MYSOLINE) 50 mg tablet Take 1 Tab by mouth three (3) times daily. 01/20/13  Yes Weyman Croon, NP   hydroxychloroquine (PLAQUENIL) 200 mg tablet Take 200 mg by mouth daily.   Yes Phys Other, MD   meloxicam (MOBIC) 15 mg tablet Take 15 mg by mouth daily.   Yes Phys Other, MD   prochlorperazine (COMPAZINE) 10 mg tablet Take 1 Tab by mouth every eight (8) hours as needed for Nausea. 12/25/09  Yes Theone Murdoch, MD   potassium chloride SR (KLOR-CON 10) 10 mEq tablet Take 1 Tab by mouth daily. 12/25/09  Yes Theone Murdoch, MD   gabapentin (NEURONTIN) 300 mg capsule Take 600 mg by mouth three (3) times daily.   Yes Phys Other, MD   valacyclovir (VALTREX) 1 g tablet Take 1,000 mg by mouth daily. 08/28/09  Yes Phys Other, MD   duloxetine (CYMBALTA) 60 mg capsule Take 60 mg by mouth two (2) times a day. 08/28/09  Yes Phys Other, MD   triamterene-hydrochlorothiazide (DYAZIDE) 37.5-25 mg per capsule Take 1 Cap by mouth daily.   Yes Phys Other, MD   hydrocodone-acetaminophen (LORTAB) 5-500 mg per tablet Take 1 Tab by mouth  every eight (8) hours as needed. 08/28/09  Yes Phys Other, MD   ergocalciferol (VITAMIN D) 50,000 unit capsule Take 50,000 Units by mouth every seven (7) days. 08/28/09  Yes Phys Other, MD   cyclobenzaprine (FLEXERIL) 10 mg tablet Take 10 mg by mouth two (2) times a day. 08/28/09  Yes Phys Other, MD   diazepam (VALIUM) 5 mg tablet Take 5 mg by mouth every six (6) hours as needed. 08/28/09  Yes Phys Other, MD   azelastine (ASTELIN) 137 mcg nasal spray 1 Spray by Nasal route two (2) times a day. Administer to right and left nostril.   Yes Phys Other, MD   butalbital-acetaminophen-caffeine (FIORICET) 50-325-40 mg per tablet Take 2 Tabs by mouth every four (4) hours as needed. 08/28/09  Yes Phys Other, MD   prednisoLONE acetate (PRED FORTE) 1 % ophthalmic suspension Administer 1 Drop to right eye daily. 08/28/09  Yes Phys Other, MD   ketorolac (TORADOL) 10 mg tablet Take 10 mg by mouth.    Phys Other, MD       Physical Exam:   General: alert, no distress   HEENT: Head: Normocephalic, no lesions, without obvious abnormality.   Heart: regular rate and rhythm, S1, S2 normal, no murmur, click, rub or gallop   Lungs: chest clear, no wheezing, rales, normal symmetric air entry   Abdominal: soft, nontender, nondistended, + BS   Neurological: Grossly normal   Extremities: extremities normal, atraumatic, no cyanosis or edema     Findings/Diagnosis: H/O Polyps; change in bowel habits; non cardiac chest pain    Plan of Care/Planned Procedure: EGD and Colonoscopy with MAC    Signed By: Christin Bach, MD     April 19, 2014

## 2014-04-19 NOTE — Anesthesia Pre-Procedure Evaluation (Addendum)
Anesthetic History        Pertinent negatives: No increased risk of difficult airway (pt described difficulty breathing after a general anesthetic, but denies any knowledge of difficult intubation.)       Review of Systems / Medical History  Patient summary reviewed, nursing notes reviewed and pertinent labs reviewed    Pulmonary          Smoker         Neuro/Psych         Psychiatric history     Cardiovascular  Within defined limits                Exercise tolerance: >4 METS     GI/Hepatic/Renal  Within defined limits              Endo/Other        Obesity and arthritis     Other Findings   Comments: shingles, lupus, menieres, Fibromyalgia  Burning sensation of feet    Unspecified hereditary and idiopathic peripheral neuropathy   Lumbar post-laminectomy syndrome   Lumbar radiculopathy   Limb pain   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   Adjustment disorder with mixed anxiety and depressed mood   Other pain disorders related to psychological factors   Tremors of nervous system   Chronic head pain   ADHD (attention deficit hyperactivity disorder), inattentive type                 Physical Exam    Airway  Mallampati: II  TM Distance: 4 - 6 cm  Neck ROM: normal range of motion   Mouth opening: Normal     Cardiovascular          Murmur: Grade 1     Dental  No notable dental hx       Pulmonary  Breath sounds clear to auscultation               Abdominal  GI exam deferred       Other Findings            Anesthetic Plan    ASA: 3  Anesthesia type: MAC            Anesthetic plan and risks discussed with: Patient

## 2014-04-19 NOTE — Other (Signed)
Pt tolerated procedure well.  Biopsies of EGD and colon with small amount of bleeding.  Abdomen soft post procedure but c/o by patient of gas discomfort.  Flatus encouraged and patient able to pass.

## 2014-04-19 NOTE — Procedures (Signed)
Colonoscopy Procedure Note    Indications: Change in bowel habits    Referring Physician: Adalberto Cole, NP  Anesthesia/Sedation: MAC anesthesia Propofol  Endoscopist:  Dr. Zane Herald    Procedure in Detail:  Informed consent was obtained for the procedure, including sedation.  Risks of perforation, hemorrhage, adverse drug reaction, and aspiration were discussed. The patient was placed in the left lateral decubitus position.  Based on the pre-procedure assessment, including review of the patient's medical history, medications, allergies, and review of systems, she had been deemed to be an appropriate candidate for moderate sedation; she was therefore sedated with the medications listed above.   The patient was monitored continuously with ECG tracing, pulse oximetry, blood pressure monitoring, and direct observations.      A rectal examination was performed. The PCF160AL was inserted into the rectum and advanced under direct vision to the cecum, which was identified by the ileocecal valve and appendiceal orifice.  The quality of the colonic preparation was adequate.  A careful inspection was made as the colonoscope was withdrawn, including a retroflexed view of the rectum; findings and interventions are described below.  Appropriate photodocumentation was obtained.    Findings:     1.  Scope advanced to the cecum.  2.  Preparation adequate.  3.  Normal mucosa throughout s/p Bx of colon.  4.  Diffuse left sided diverticulosis.  5.  Small internal hemorrhoids.    Therapies:  Bx of colon    Specimen: Specimens were collected as described above and sent to pathology.     Complications: None were encountered during the procedure.     EBL: < 10 ml.    Recommendations:   -f/u path  -fiber supplement  -continue Linzess for constipation  -repeat colonoscopy in 10 years  Signed By: Christin Bach, MD                        April 19, 2014

## 2014-04-19 NOTE — Other (Signed)
C/O nausea.  Dr. Rudi Coco and CRNA notified.

## 2014-04-19 NOTE — Progress Notes (Signed)
Pt's pain level a "2" at the time of discharge in her LLQ of her anterior abdomen.  She describes it as "Sore", but so much better. I encouraged  Her to continue to try and pass air, she has been passing flatus while in Recovery. Vital signs are stable and skin is warm and dry, no complaints oferred. Harlon Ditty RN

## 2014-04-19 NOTE — Anesthesia Post-Procedure Evaluation (Signed)
Post-Anesthesia Evaluation and Assessment    Patient: Katherine [name] MRN: [id number]  SSN: [id number]    Date of Birth: 1956/03/10  Age: 58 y.o.  Sex: female       Cardiovascular Function/Vital Signs  Visit Vitals   Item Reading   ??? BP 137/83 mmHg   ??? Pulse 79   ??? Resp 20   ??? Ht 5\' 7"  (1.702 m)   ??? Wt 95.709 kg (211 lb)   ??? BMI 33.04 kg/m2   ??? SpO2 100%       Patient is status post general anesthesia for Procedure(s):  COLONOSCOPY  EGD  ENDOSCOPIC BRUSHING  ESOPHAGOGASTRODUODENAL (EGD) BIOPSY  COLONOSCOPY WITH BIOPSY.    Nausea/Vomiting: None    Postoperative hydration reviewed and adequate.    Pain:  Pain Scale 1: Numeric (0 - 10) (04/19/14 1116)  Pain Intensity 1: 5 (abd cramping.) (04/19/14 1116)   Managed    Neurological Status:       At baseline    Mental Status and Level of Consciousness: Alert and oriented     Pulmonary Status:   O2 Device: Nasal cannula (04/19/14 1116)   Adequate oxygenation and airway patent    Complications related to anesthesia: None    Post-anesthesia assessment completed. No concerns    Signed By: Waymon Amato, MD     April 19, 2014

## 2014-04-19 NOTE — Addendum Note (Signed)
Addendum  created 04/19/14 1133 by Rudi Heap, CRNA    Modules edited: Anesthesia Medication Administration

## 2014-04-19 NOTE — Other (Signed)
.  Katherine [name]  [birth year]  [id number]    Situation:  Verbal report received from: L.Parker RN  Procedure: Procedure(s):  COLONOSCOPY  EGD  ENDOSCOPIC BRUSHING  ESOPHAGOGASTRODUODENAL (EGD) BIOPSY  COLONOSCOPY WITH BIOPSY    Background:    Preoperative diagnosis: CONSTIPATION  CHG IN BOWELS  ESOPHAGEAL DYSMOTILITY   PERSONAL HX OF POLYPS   CHEST PAIN   Postoperative diagnosis: Esophagitis  Gastritis  Diverticulosis  Internal Hemorrhoids    Operator:  Dr.  Samantha Crimes  Assistant(s): Endoscopy Technician-1: Wynelle Menlo  Endoscopy RN-1: Tobie Poet, RN    Specimens:   ID Type Source Tests Collected by Time Destination   1 : Biopsy of Duodenum Preservative Duodenum  Christin Bach, MD 04/19/2014 1051 Pathology   2 : Biopsy of Stomach Preservative Gastric  Christin Bach, MD 04/19/2014 1052 Pathology   3 : Biopsy of Distal Esophagus Preservative Esophagus, Distal  Christin Bach, MD 04/19/2014 1052 Pathology   4 : Biopsy of Mid Esophagus Preservative Esophagus, Mid  Christin Bach, MD 04/19/2014 1053 Pathology   5 : Random colon Biopsies Preservative   Christin Bach, MD 04/19/2014 1120 Pathology   1 : Brushing Esophagus  Esophageal Brushing  Christin Bach, MD 04/19/2014 1054 Microbiology     H. Pylori  Yes    Assessment:  Intra-procedure medications        Anesthesia gave intra-procedure sedation and medications, see anesthesia flow sheet      Intravenous fluids: NS@ KVO     Vital signs stable      Abdominal assessment: round and soft      Recommendation:  Discharge patient per MD order      Family or Friend , pt's fiance is present   Permission to share finding with family or friend Yes

## 2014-04-19 NOTE — Other (Signed)
Report to K. Moats, RN in recovery.

## 2014-04-20 MED FILL — DIPRIVAN 10 MG/ML INTRAVENOUS EMULSION: 10 mg/mL | INTRAVENOUS | Qty: 440

## 2014-04-20 MED FILL — LIDOCAINE (PF) 20 MG/ML (2 %) IJ SOLN: 20 mg/mL (2 %) | INTRAMUSCULAR | Qty: 100

## 2014-04-20 MED FILL — ONDANSETRON (PF) 4 MG/2 ML INJECTION: 4 mg/2 mL | INTRAMUSCULAR | Qty: 4

## 2014-05-18 ENCOUNTER — Encounter

## 2014-05-19 ENCOUNTER — Encounter

## 2014-05-19 NOTE — Telephone Encounter (Signed)
Requested Prescriptions     Pending Prescriptions Disp Refills   ??? atomoxetine (STRATTERA) 80 mg capsule 90 Cap 6     Sig: Take 1 Cap by mouth daily.     Pt needs refill sent in

## 2014-05-25 ENCOUNTER — Encounter

## 2014-05-31 ENCOUNTER — Inpatient Hospital Stay: Admit: 2014-05-31 | Payer: MEDICARE | Primary: Family Medicine

## 2014-05-31 DIAGNOSIS — M4802 Spinal stenosis, cervical region: Secondary | ICD-10-CM

## 2014-05-31 MED ORDER — GADOBUTROL 10 MMOL/10 ML (1 MMOL/ML) IV
10 mmol/ mL (1 mmol/mL) | Freq: Once | INTRAVENOUS | Status: AC
Start: 2014-05-31 — End: 2014-05-31
  Administered 2014-05-31: 22:00:00 via INTRAVENOUS

## 2014-06-12 NOTE — Telephone Encounter (Signed)
We referred her to psychiatrist foe this

## 2014-06-19 ENCOUNTER — Inpatient Hospital Stay
Admit: 2014-06-19 | Discharge: 2014-06-19 | Disposition: A | Discharge status: Home / Self Care | Payer: MEDICARE | Attending: Emergency Medicine

## 2014-06-19 DIAGNOSIS — R51 Headache: Secondary | ICD-10-CM

## 2014-06-19 LAB — CBC WITH AUTOMATED DIFF
ABS. BASOPHILS: 0 10*3/uL (ref 0.0–0.1)
ABS. EOSINOPHILS: 0.1 10*3/uL (ref 0.0–0.4)
ABS. LYMPHOCYTES: 1 10*3/uL (ref 0.8–3.5)
ABS. MONOCYTES: 0.2 10*3/uL (ref 0.0–1.0)
ABS. NEUTROPHILS: 1.4 10*3/uL — ABNORMAL LOW (ref 1.8–8.0)
BASOPHILS: 0 % (ref 0–1)
EOSINOPHILS: 2 % (ref 0–7)
HCT: 39.3 % (ref 35.0–47.0)
HGB: 14.2 g/dL (ref 11.5–16.0)
LYMPHOCYTES: 36 % (ref 12–49)
MCH: 34.4 PG — ABNORMAL HIGH (ref 26.0–34.0)
MCHC: 36.1 g/dL (ref 30.0–36.5)
MCV: 95.2 FL (ref 80.0–99.0)
MONOCYTES: 9 % (ref 5–13)
NEUTROPHILS: 53 % (ref 32–75)
PLATELET: 215 10*3/uL (ref 150–400)
RBC: 4.13 M/uL (ref 3.80–5.20)
RDW: 12.6 % (ref 11.5–14.5)
WBC: 2.7 10*3/uL — ABNORMAL LOW (ref 3.6–11.0)

## 2014-06-19 LAB — URINALYSIS W/ REFLEX CULTURE
Bilirubin: NEGATIVE
Blood: NEGATIVE
Glucose: NEGATIVE mg/dL
Ketone: NEGATIVE mg/dL
Nitrites: NEGATIVE
Protein: NEGATIVE mg/dL
Specific gravity: 1.006 (ref 1.003–1.030)
Urobilinogen: 0.2 EU/dL (ref 0.2–1.0)
pH (UA): 7 (ref 5.0–8.0)

## 2014-06-19 LAB — METABOLIC PANEL, COMPREHENSIVE
A-G Ratio: 1.2 (ref 1.1–2.2)
ALT (SGPT): 41 U/L (ref 12–78)
AST (SGOT): 54 U/L — ABNORMAL HIGH (ref 15–37)
Albumin: 3.8 g/dL (ref 3.5–5.0)
Alk. phosphatase: 94 U/L (ref 45–117)
Anion gap: 7 mmol/L (ref 5–15)
BUN/Creatinine ratio: 11 — ABNORMAL LOW (ref 12–20)
BUN: 7 MG/DL (ref 6–20)
Bilirubin, total: 0.4 MG/DL (ref 0.2–1.0)
CO2: 28 mmol/L (ref 21–32)
Calcium: 8.5 MG/DL (ref 8.5–10.1)
Chloride: 96 mmol/L — ABNORMAL LOW (ref 97–108)
Creatinine: 0.66 MG/DL (ref 0.55–1.02)
GFR est AA: >60 mL/min/{1.73_m2} (ref >60)
GFR est non-AA: >60 mL/min/{1.73_m2} (ref >60)
Globulin: 3.3 g/dL (ref 2.0–4.0)
Glucose: 86 mg/dL (ref 65–100)
Potassium: 4 mmol/L (ref 3.5–5.1)
Protein, total: 7.1 g/dL (ref 6.4–8.2)
Sodium: 131 mmol/L — ABNORMAL LOW (ref 136–145)

## 2014-06-19 MED ORDER — SODIUM CHLORIDE 0.9 % IJ SYRG
Freq: Three times a day (TID) | INTRAMUSCULAR | Status: DC
Start: 2014-06-19 — End: 2014-06-19
  Administered 2014-06-19: 21:00:00 via INTRAVENOUS

## 2014-06-19 MED ORDER — HYDROMORPHONE 2 MG TAB
2 mg | ORAL_TABLET | ORAL | Status: DC | PRN
Start: 2014-06-19 — End: 2015-03-13

## 2014-06-19 MED ORDER — HYDROMORPHONE (PF) 1 MG/ML IJ SOLN
1 mg/mL | INTRAMUSCULAR | Status: DC
Start: 2014-06-19 — End: 2014-06-19

## 2014-06-19 MED ORDER — SODIUM CHLORIDE 0.9% BOLUS IV
0.9 % | INTRAVENOUS | Status: AC
Start: 2014-06-19 — End: 2014-06-19
  Administered 2014-06-19: 21:00:00 via INTRAVENOUS

## 2014-06-19 MED ORDER — PROCHLORPERAZINE EDISYLATE 5 MG/ML INJECTION
5 mg/mL | INTRAMUSCULAR | Status: AC
Start: 2014-06-19 — End: 2014-06-19
  Administered 2014-06-19: 21:00:00 via INTRAVENOUS

## 2014-06-19 MED ORDER — PROMETHAZINE 25 MG TAB
25 mg | ORAL_TABLET | Freq: Four times a day (QID) | ORAL | Status: DC | PRN
Start: 2014-06-19 — End: 2017-03-04

## 2014-06-19 MED ORDER — SODIUM CHLORIDE 0.9 % IJ SYRG
INTRAMUSCULAR | Status: DC | PRN
Start: 2014-06-19 — End: 2014-06-19

## 2014-06-19 MED ORDER — HYDROMORPHONE (PF) 1 MG/ML IJ SOLN
1 mg/mL | INTRAMUSCULAR | Status: AC
Start: 2014-06-19 — End: 2014-06-19
  Administered 2014-06-19: 21:00:00 via INTRAVENOUS

## 2014-06-19 MED FILL — HYDROMORPHONE (PF) 1 MG/ML IJ SOLN: 1 mg/mL | INTRAMUSCULAR | Qty: 1

## 2014-06-19 MED FILL — PROCHLORPERAZINE EDISYLATE 5 MG/ML INJECTION: 5 mg/mL | INTRAMUSCULAR | Qty: 2

## 2014-06-19 MED FILL — SODIUM CHLORIDE 0.9 % IV: INTRAVENOUS | Qty: 1000

## 2014-06-19 NOTE — ED Notes (Signed)
Pt rang out and states her headache is better rates a 5/10 at this time but states she is having some indigestion and would like a cup of hot water and graham crackers. Provided hot water and graham crackers for patient at this time. Pt resting on stretcher at this time with lights dimmed for comfort.

## 2014-06-19 NOTE — ED Provider Notes (Signed)
HPI Comments: Katherine [name] is a 58 y.o. Female, with hx of Lupus, shingles, and migraines, who presents ambulatory to [facility] ED with cc of worsening 8/10 intermittent HA x 2 days.  Pt also c/o intermittent dizziness since today, tinnitus in L ear, and rash to L forehead.  Pt reports HA and rash are typical for her migraine and Lupus flare-ups.  Pt notes she is chronically on medications for shingles. Pt specifically denies fever, chills, CP, ABD pain, nausea, vomiting, diarrhea, numbness, or weakness.      PCP: Adalberto Cole, NP    PMhx is significant for: Lupus, migraines, fibromyalgia, shingles  PShx is significant for: hysterectomy  Social hx: + Tobacco (former), - EtOH, - Illicit Drugs    There are no other complaints, changes or physical findings at this time.  Written by Arvil Persons, ED Scribe, as dictated by Lenard Forth, DO    The history is provided by the patient.        Past Medical History   Diagnosis Date   ??? Other ill-defined conditions(799.89)      shingles, lupus, menieres, Fibromyalgia   ??? Autoimmune disease ([place])    ??? Chronic pain    ??? Depression    ??? Arthritis    ??? Headache(784.0)    ??? Burning with urination    ??? Bowel disease    ??? Sweats, menopausal    ??? Night sweats    ??? Visual loss    ??? Hearing loss    ??? Diarrhea    ??? Dizziness    ??? Numbness    ??? Easy bruising    ??? Difficult intubation      difficulty breathing after awakening in past.        Past Surgical History   Procedure Laterality Date   ??? Hx gyn       hysterectomy   ??? Hx transplant       right corona   ??? Hx heent       right eye corona transplant, both ears, tonsillectomy   ??? Hx appendectomy     ??? Hx urological       bladder surgery x2   ??? Hx orthopaedic       back x2, right knee   ??? Hx orthopaedic  01-2014      right hand surgery   ??? Hx cholecystectomy     ??? Pr abdomen surgery proc unlisted  1994     1 foot of colon removed   ??? Hx hernia repair           Family History   Problem Relation Age of Onset    ??? Heart Disease Mother    ??? Cancer Mother      breast cancer   ??? Hypertension Father    ??? Diabetes Father    ??? Heart Disease Sister         History     Social History   ??? Marital Status: WIDOWED     Spouse Name: N/A     Number of Children: N/A   ??? Years of Education: N/A     Occupational History   ??? Not on file.     Social History Main Topics   ??? Smoking status: Former Smoker -- 0.25 packs/day for 30 years     Quit date: 11/16/2013   ??? Smokeless tobacco: Never Used   ??? Alcohol Use: No   ??? Drug Use: No   ??? Sexual  Activity: Not on file     Other Topics Concern   ??? Not on file     Social History Narrative                  ALLERGIES: Morphine; Other medication; and Pcn      Review of Systems   Constitutional: Negative for fever, chills and unexpected weight change.   HENT: Positive for tinnitus (L ear). Negative for congestion, dental problem, facial swelling, hearing loss, nosebleeds, rhinorrhea, sinus pressure, sore throat, trouble swallowing and voice change.    Eyes: Negative for photophobia, pain, redness and visual disturbance.   Respiratory: Negative for cough, chest tightness, shortness of breath and wheezing.    Cardiovascular: Negative for chest pain, palpitations and leg swelling.   Gastrointestinal: Negative for nausea, vomiting, abdominal pain, diarrhea, constipation, blood in stool, abdominal distention, anal bleeding and rectal pain.   Genitourinary: Negative for dysuria, urgency, frequency, hematuria, flank pain, decreased urine volume and difficulty urinating.   Musculoskeletal: Negative for myalgias, back pain, joint swelling, arthralgias, gait problem and neck pain.   Skin: Positive for rash (L forehead). Negative for color change, pallor and wound.   Neurological: Positive for dizziness and headaches. Negative for tremors, seizures, syncope, facial asymmetry, speech difficulty, weakness, light-headedness and numbness.   Hematological: Negative for adenopathy. Does not bruise/bleed easily.    Psychiatric/Behavioral: Negative for confusion and sleep disturbance.   All other systems reviewed and are negative.      Patient Vitals for the past 12 hrs:   Temp Pulse Resp BP SpO2   06/19/14 1745 - - 18 155/75 mmHg 98 %   06/19/14 1730 - - 18 148/67 mmHg 96 %   06/19/14 1715 - - 18 142/64 mmHg 96 %   06/19/14 1700 - - 18 144/73 mmHg 96 %   06/19/14 1536 98.4 ??F (36.9 ??C) 82 16 192/87 mmHg 99 %         Physical Exam   Constitutional: She is oriented to person, place, and time. She appears well-developed and well-nourished. No distress.   HENT:   Head: Normocephalic and atraumatic.   Right Ear: Tympanic membrane and external ear normal.   Left Ear: Tympanic membrane and external ear normal.   Nose: Nose normal.   Mouth/Throat: Oropharynx is clear and moist and mucous membranes are normal. No oropharyngeal exudate.   Eyes: Conjunctivae and EOM are normal. Pupils are equal, round, and reactive to light. Right eye exhibits no discharge. Left eye exhibits no discharge. No scleral icterus.   Neck: Normal range of motion. Neck supple. No JVD present. No tracheal deviation present. No thyromegaly present.   Cardiovascular: Normal rate, regular rhythm, normal heart sounds and intact distal pulses.  Exam reveals no gallop and no friction rub.    No murmur heard.  Pulmonary/Chest: Effort normal and breath sounds normal. No stridor. No respiratory distress. She has no wheezes. She has no rales. She exhibits no tenderness.   Abdominal: Soft. Bowel sounds are normal. She exhibits no distension and no mass. There is no tenderness. There is no rebound and no guarding.   Musculoskeletal: Normal range of motion. She exhibits no edema or tenderness.   Lymphadenopathy:     She has no cervical adenopathy.   Neurological: She is alert and oriented to person, place, and time. She has normal strength and normal reflexes. No cranial nerve deficit or sensory deficit. She exhibits normal muscle tone. Coordination and gait normal.    Skin: Skin is  warm and dry. No rash noted. She is not diaphoretic. No erythema. No pallor.   Psychiatric: She has a normal mood and affect.   Nursing note and vitals reviewed.   Written by Redge Gainer, ED Scribe, as dictated by Lenard Forth, DO    MDM  Number of Diagnoses or Management Options  Acute nonintractable headache, unspecified headache type:   Diagnosis management comments: Headache with similar in past better with dilaudid, no neuro symptoms or change in ha to need imaging, afebrile, no meningismus to suggest meningitis, no obvious shingles rash will dc       Amount and/or Complexity of Data Reviewed  Clinical lab tests: ordered and reviewed  Review and summarize past medical records: yes    Patient Progress  Patient progress: stable      Procedures    LABORATORY TESTS:  Recent Results (from the past 12 hour(s))   CBC WITH AUTOMATED DIFF    Collection Time: 06/19/14  4:46 PM   Result Value Ref Range    WBC 2.7 (L) 3.6 - 11.0 K/uL    RBC 4.13 3.80 - 5.20 M/uL    HGB 14.2 11.5 - 16.0 g/dL    HCT 39.3 35.0 - 47.0 %    MCV 95.2 80.0 - 99.0 FL    MCH 34.4 (H) 26.0 - 34.0 PG    MCHC 36.1 30.0 - 36.5 g/dL    RDW 12.6 11.5 - 14.5 %    PLATELET 215 150 - 400 K/uL    NEUTROPHILS 53 32 - 75 %    LYMPHOCYTES 36 12 - 49 %    MONOCYTES 9 5 - 13 %    EOSINOPHILS 2 0 - 7 %    BASOPHILS 0 0 - 1 %    ABS. NEUTROPHILS 1.4 (L) 1.8 - 8.0 K/UL    ABS. LYMPHOCYTES 1.0 0.8 - 3.5 K/UL    ABS. MONOCYTES 0.2 0.0 - 1.0 K/UL    ABS. EOSINOPHILS 0.1 0.0 - 0.4 K/UL    ABS. BASOPHILS 0.0 0.0 - 0.1 K/UL   METABOLIC PANEL, COMPREHENSIVE    Collection Time: 06/19/14  4:46 PM   Result Value Ref Range    Sodium 131 (L) 136 - 145 mmol/L    Potassium 4.0 3.5 - 5.1 mmol/L    Chloride 96 (L) 97 - 108 mmol/L    CO2 28 21 - 32 mmol/L    Anion gap 7 5 - 15 mmol/L    Glucose 86 65 - 100 mg/dL    BUN 7 6 - 20 MG/DL    Creatinine 0.66 0.55 - 1.02 MG/DL    BUN/Creatinine ratio 11 (L) 12 - 20      GFR est AA >60 >60 ml/min/1.34m     GFR est non-AA >60 >60 ml/min/1.743m   Calcium 8.5 8.5 - 10.1 MG/DL    Bilirubin, total 0.4 0.2 - 1.0 MG/DL    ALT 41 12 - 78 U/L    AST 54 (H) 15 - 37 U/L    Alk. phosphatase 94 45 - 117 U/L    Protein, total 7.1 6.4 - 8.2 g/dL    Albumin 3.8 3.5 - 5.0 g/dL    Globulin 3.3 2.0 - 4.0 g/dL    A-G Ratio 1.2 1.1 - 2.2     URINALYSIS W/ REFLEX CULTURE    Collection Time: 06/19/14  5:04 PM   Result Value Ref Range    Color YELLOW/STRAW      Appearance CLEAR  CLEAR      Specific gravity 1.006 1.003 - 1.030      pH (UA) 7.0 5.0 - 8.0      Protein NEGATIVE  NEG mg/dL    Glucose NEGATIVE  NEG mg/dL    Ketone NEGATIVE  NEG mg/dL    Bilirubin NEGATIVE  NEG      Blood NEGATIVE  NEG      Urobilinogen 0.2 0.2 - 1.0 EU/dL    Nitrites NEGATIVE  NEG      Leukocyte Esterase MODERATE (A) NEG      UA:UC IF INDICATED URINE CULTURE ORDERED (A) CNI      WBC 10-20 0 - 4 /hpf    RBC 0-5 0 - 5 /hpf    Epithelial cells FEW FEW /lpf    Bacteria 1+ (A) NEG /hpf    Hyaline Cast 0-2 0 - 2 /lpf       MEDICATIONS GIVEN:  Medications   sodium chloride (NS) flush 5-10 mL ( IntraVENous Canceled Entry 06/19/14 1632)   sodium chloride (NS) flush 5-10 mL (not administered)   HYDROmorphone (PF) (DILAUDID) injection 1 mg (not administered)   sodium chloride 0.9 % bolus infusion 1,000 mL (1,000 mL IntraVENous New Bag 06/19/14 1707)   HYDROmorphone (PF) (DILAUDID) injection 1 mg (1 mg IntraVENous Given 06/19/14 1706)   prochlorperazine (COMPAZINE) injection 10 mg (10 mg IntraVENous Given 06/19/14 1706)       IMPRESSION:  1. Acute nonintractable headache, unspecified headache type        PLAN:  1. Rx: Dilaudid, Phenergan   2. F/u with PCP  Return to ED if worse     DISCHARGE NOTE  5:58 PM  The patient has been re-evaluated and is ready for discharge. Reviewed available results with patient. Counseled pt on diagnosis and care plan. Pt has expressed understanding, and all questions have been answered. Pt  agrees with plan and agrees to F/U as recommended, or return to the ED if their sxs worsen. Discharge instructions have been provided and explained to the pt, along with reasons to return to the ED.  Written by Alger Memos, ED Scribe, as dictated by Lenard Forth, DO

## 2014-06-19 NOTE — ED Notes (Signed)
Pt given discharge instructions and prescriptions by Dr. Wendi Snipes. Pt able to verbalize understanding of these instructions at this time. No other questions. Pt wheeled out of ED at this time.

## 2014-06-19 NOTE — ED Notes (Signed)
Pt comes in with complaint of migraine that per the patient is a chronic condition but this episode started on Saturday and has progressively become worst despite taking her normal medications and prn medications. Per the patient he rates the pain a 7/10 at this time and describes it as an aching pain at this time. Per the patient she also complains of nausea that started on Saturday but denies any vomiting at this time. Per the patient she also complains of dizziness and lightheadedness that per the patient she states has been intermittent since Saturday. Pt denies any diarrhea, constipation, fevers, or chills at this time.

## 2014-06-21 LAB — CULTURE, URINE
Colonies Counted: 60000
Colony Count: 60000

## 2014-07-18 ENCOUNTER — Emergency Department (HOSPITAL_COMMUNITY): Payer: Medicare Other

## 2014-07-18 ENCOUNTER — Emergency Department (HOSPITAL_COMMUNITY)
Admission: EM | Admit: 2014-07-18 | Discharge: 2014-07-19 | Disposition: A | Discharge status: Home / Self Care | Payer: Medicare Other | Attending: Emergency Medicine | Admitting: Emergency Medicine

## 2014-07-18 ENCOUNTER — Encounter (HOSPITAL_COMMUNITY): Payer: Self-pay | Admitting: Emergency Medicine

## 2014-07-18 DIAGNOSIS — H8109 Meniere's disease, unspecified ear: Secondary | ICD-10-CM | POA: Insufficient documentation

## 2014-07-18 DIAGNOSIS — Z8739 Personal history of other diseases of the musculoskeletal system and connective tissue: Secondary | ICD-10-CM | POA: Diagnosis not present

## 2014-07-18 DIAGNOSIS — Z8719 Personal history of other diseases of the digestive system: Secondary | ICD-10-CM | POA: Insufficient documentation

## 2014-07-18 DIAGNOSIS — Y9289 Other specified places as the place of occurrence of the external cause: Secondary | ICD-10-CM | POA: Insufficient documentation

## 2014-07-18 DIAGNOSIS — R51 Headache: Secondary | ICD-10-CM

## 2014-07-18 DIAGNOSIS — Z79899 Other long term (current) drug therapy: Secondary | ICD-10-CM | POA: Diagnosis not present

## 2014-07-18 DIAGNOSIS — Z791 Long term (current) use of non-steroidal anti-inflammatories (NSAID): Secondary | ICD-10-CM | POA: Diagnosis not present

## 2014-07-18 DIAGNOSIS — R519 Headache, unspecified: Secondary | ICD-10-CM

## 2014-07-18 DIAGNOSIS — Y998 Other external cause status: Secondary | ICD-10-CM | POA: Diagnosis not present

## 2014-07-18 DIAGNOSIS — R55 Syncope and collapse: Secondary | ICD-10-CM | POA: Insufficient documentation

## 2014-07-18 DIAGNOSIS — S0990XA Unspecified injury of head, initial encounter: Secondary | ICD-10-CM | POA: Diagnosis present

## 2014-07-18 DIAGNOSIS — Y9389 Activity, other specified: Secondary | ICD-10-CM | POA: Diagnosis not present

## 2014-07-18 DIAGNOSIS — W010XXA Fall on same level from slipping, tripping and stumbling without subsequent striking against object, initial encounter: Secondary | ICD-10-CM | POA: Insufficient documentation

## 2014-07-18 DIAGNOSIS — W19XXXA Unspecified fall, initial encounter: Secondary | ICD-10-CM

## 2014-07-18 HISTORY — DX: Meniere's disease, unspecified ear: H81.09

## 2014-07-18 HISTORY — DX: Systemic lupus erythematosus, unspecified: M32.9

## 2014-07-18 HISTORY — DX: Noninfective gastroenteritis and colitis, unspecified: K52.9

## 2014-07-18 LAB — CBC WITH DIFFERENTIAL/PLATELET
Basophils Absolute: 0 10*3/uL (ref 0.0–0.1)
Basophils Relative: 0 % (ref 0–1)
Eosinophils Absolute: 0.2 10*3/uL (ref 0.0–0.7)
Eosinophils Relative: 3 % (ref 0–5)
HCT: 37.5 % (ref 36.0–46.0)
Hemoglobin: 13.5 g/dL (ref 12.0–15.0)
LYMPHS PCT: 35 % (ref 12–46)
Lymphs Abs: 1.7 10*3/uL (ref 0.7–4.0)
MCH: 33.3 pg (ref 26.0–34.0)
MCHC: 36 g/dL (ref 30.0–36.0)
MCV: 92.6 fL (ref 78.0–100.0)
Monocytes Absolute: 0.4 10*3/uL (ref 0.1–1.0)
Monocytes Relative: 9 % (ref 3–12)
NEUTROS PCT: 53 % (ref 43–77)
Neutro Abs: 2.5 10*3/uL (ref 1.7–7.7)
PLATELETS: 222 10*3/uL (ref 150–400)
RBC: 4.05 MIL/uL (ref 3.87–5.11)
RDW: 12.1 % (ref 11.5–15.5)
WBC: 4.8 10*3/uL (ref 4.0–10.5)

## 2014-07-18 LAB — COMPREHENSIVE METABOLIC PANEL
ALK PHOS: 92 U/L (ref 39–117)
ALT: 19 U/L (ref 0–35)
AST: 43 U/L — ABNORMAL HIGH (ref 0–37)
Albumin: 4.1 g/dL (ref 3.5–5.2)
Anion gap: 13 (ref 5–15)
BUN: 6 mg/dL (ref 6–23)
CO2: 27 meq/L (ref 19–32)
Calcium: 9.1 mg/dL (ref 8.4–10.5)
Chloride: 89 mEq/L — ABNORMAL LOW (ref 96–112)
Creatinine, Ser: 0.78 mg/dL (ref 0.50–1.10)
GFR, EST NON AFRICAN AMERICAN: 89 mL/min — AB (ref >90)
Glucose, Bld: 89 mg/dL (ref 70–99)
POTASSIUM: 3.4 meq/L — AB (ref 3.7–5.3)
SODIUM: 129 meq/L — AB (ref 137–147)
Total Bilirubin: 0.3 mg/dL (ref 0.3–1.2)
Total Protein: 7.1 g/dL (ref 6.0–8.3)

## 2014-07-18 MED ORDER — ONDANSETRON HCL 4 MG/2ML IJ SOLN
4.0000 mg | Freq: Once | INTRAMUSCULAR | Status: AC
  Administered 2014-07-18: 4 mg via INTRAVENOUS
  Filled 2014-07-18: qty 2

## 2014-07-18 MED ORDER — SODIUM CHLORIDE 0.9 % IV BOLUS (SEPSIS)
1000.0000 mL | Freq: Once | INTRAVENOUS | Status: AC
  Administered 2014-07-18: 1000 mL via INTRAVENOUS

## 2014-07-18 MED ORDER — KETOROLAC TROMETHAMINE 30 MG/ML IJ SOLN
30.0000 mg | Freq: Once | INTRAMUSCULAR | Status: AC
  Administered 2014-07-18: 30 mg via INTRAVENOUS
  Filled 2014-07-18: qty 1

## 2014-07-18 MED ORDER — HYDROMORPHONE HCL 1 MG/ML IJ SOLN
1.0000 mg | Freq: Once | INTRAMUSCULAR | Status: AC
  Administered 2014-07-18: 1 mg via INTRAVENOUS
  Filled 2014-07-18: qty 1

## 2014-07-18 NOTE — ED Notes (Signed)
Walked in to assess. Patient utilizing bedpan. Family at the bedside.

## 2014-07-18 NOTE — ED Notes (Signed)
MD Lockwood at the bedside  

## 2014-07-18 NOTE — ED Notes (Signed)
Pt. had a syncopal episode and fell this evening while visiting a family in the hospital ,  Denies injury , alert and oriented , respirations unlabored , mild dizziness. Speech clear , no facial asymmetry , no arm drift . Mild nausea / photophobia.

## 2014-07-18 NOTE — ED Notes (Signed)
MD made aware of Neuro assessment.

## 2014-07-19 DIAGNOSIS — S0990XA Unspecified injury of head, initial encounter: Secondary | ICD-10-CM | POA: Diagnosis not present

## 2014-07-19 MED ORDER — NEOMYCIN-POLYMYXIN-HC 3.5-10000-1 OT SUSP
3.0000 [drp] | Freq: Once | OTIC | Status: AC
  Administered 2014-07-19: 3 [drp] via OTIC
  Filled 2014-07-19: qty 10

## 2014-07-19 MED ORDER — OMEPRAZOLE 20 MG PO CPDR: 20.0000 mg | DELAYED_RELEASE_CAPSULE | Freq: Every day | ORAL | Status: AC

## 2014-07-19 MED ORDER — GI COCKTAIL ~~LOC~~
30.0000 mL | Freq: Once | ORAL | Status: AC
  Administered 2014-07-19: 30 mL via ORAL
  Filled 2014-07-19: qty 30

## 2014-07-19 MED ORDER — NEOMYCIN-COLIST-HC-THONZONIUM 3.3-3-10-0.5 MG/ML OT SUSP: 3.0000 [drp] | Freq: Once | OTIC | Status: DC

## 2014-07-19 NOTE — Discharge Instructions (Signed)
As discussed, your evaluation today has been largely reassuring.  But, it is important that you monitor your condition carefully, and do not hesitate to return to the ED if you develop new, or concerning changes in your condition.  Otherwise, please follow-up with your physician for appropriate ongoing care.  Please use the provided eardrops as you have not been able to locate your own previously prescribed medication.

## 2014-07-19 NOTE — ED Provider Notes (Signed)
CSN: [id number]     Arrival date & time 07/18/14  1937 History   First MD Initiated Contact with Patient 07/18/14 2229     Chief Complaint  Patient presents with  . Loss of Consciousness     HPI  Patient presents after an episode of near syncope. Patient was visiting a sick family member when she felt lightheaded, was lowered to the floor by her fianc. She landed on her backside, denies head trauma, trauma to any extremity. She does complain of pain in the buttocks.  She has no chest pain now, nor did she have any before the episode. She discontinued headache, though she has a long history of headaches, and has had one for several weeks. No changes from baseline characteristic of headache. No new dyspnea, confusion, disorientation. Patient acknowledges multiple medical problems, including lupus, Mnire's, chronic discomfort.  She acknowledges a MRI within the past month for her years, chronic headaches.  No report of abnormality. Past Medical History  Diagnosis Date  . Lupus (systemic lupus erythematosus)   . Meniere's disease   . Colitis    Past Surgical History  Procedure Laterality Date  . Colon resection    . Back surgery    . Eye surgery    . Abdominal hysterectomy     No family history on file. History  Substance Use Topics  . Smoking status: Never Smoker   . Smokeless tobacco: Not on file  . Alcohol Use: No   OB History    No data available     Review of Systems  Constitutional:       Per HPI, otherwise negative  HENT:       Per HPI, otherwise negative  Respiratory:       Per HPI, otherwise negative  Cardiovascular:       Per HPI, otherwise negative  Gastrointestinal: Negative for vomiting.  Endocrine:       Negative aside from HPI  Genitourinary:       Neg aside from HPI   Musculoskeletal:       Per HPI, otherwise negative  Skin: Negative.   Neurological: Positive for weakness, light-headedness and headaches. Negative for syncope.       Allergies  Morphine and related and Penicillins  Home Medications   Prior to Admission medications   Medication Sig Start Date End Date Taking? Authorizing Provider  Ascorbic Acid (VITAMIN C PO) Take 1 tablet by mouth daily.   Yes Historical Provider, MD  azelastine (ASTELIN) 0.1 % nasal spray Place 1 spray into both nostrils 2 (two) times daily. Use in each nostril as directed   Yes Historical Provider, MD  b complex vitamins capsule Take 1 capsule by mouth daily.   Yes Historical Provider, MD  butalbital-acetaminophen-caffeine (FIORICET, ESGIC) 50-325-40 MG per tablet Take 2 tablets by mouth every 4 (four) hours as needed for headache.   Yes Historical Provider, MD  carisoprodol (SOMA) 350 MG tablet Take 350 mg by mouth 3 (three) times daily.   Yes Historical Provider, MD  cetirizine (ZYRTEC) 10 MG tablet Take 10 mg by mouth daily.   Yes Historical Provider, MD  cyclobenzaprine (FLEXERIL) 10 MG tablet Take 10 mg by mouth 2 (two) times daily.   Yes Historical Provider, MD  diazepam (VALIUM) 5 MG tablet Take 5 mg by mouth every 6 (six) hours as needed for anxiety.   Yes Historical Provider, MD  DULoxetine (CYMBALTA) 60 MG capsule Take 60 mg by mouth daily.   Yes Historical Provider,  MD  Flaxseed, Linseed, (FLAXSEED OIL PO) Take 1 tablet by mouth daily.   Yes Historical Provider, MD  gabapentin (NEURONTIN) 300 MG capsule Take 600 mg by mouth 3 (three) times daily.   Yes Historical Provider, MD  HYDROcodone-acetaminophen (LORTAB) 10-500 MG per tablet Take 1 tablet by mouth every 8 (eight) hours as needed for pain.   Yes Historical Provider, MD  hydroxychloroquine (PLAQUENIL) 200 MG tablet Take 200 mg by mouth daily.   Yes Historical Provider, MD  hydrOXYzine (VISTARIL) 50 MG capsule Take 50-100 mg by mouth See admin instructions. Take 1 capsule twice a day and take 2 capsules at bedtime   Yes Historical Provider, MD  ketorolac (TORADOL) 10 MG tablet Take 10 mg by mouth every 8 (eight)  hours as needed for moderate pain.   Yes Historical Provider, MD  meloxicam (MOBIC) 15 MG tablet Take 15 mg by mouth daily.   Yes Historical Provider, MD  Multiple Vitamin (MULTIVITAMIN WITH MINERALS) TABS tablet Take 1 tablet by mouth daily.   Yes Historical Provider, MD  Omega-3 Fatty Acids (FISH OIL PO) Take 1 capsule by mouth daily.   Yes Historical Provider, MD  potassium chloride (K-DUR) 10 MEQ tablet Take 10 mEq by mouth 2 (two) times daily.   Yes Historical Provider, MD  prednisoLONE acetate (PRED FORTE) 1 % ophthalmic suspension Place 1 drop into both eyes daily as needed.   Yes Historical Provider, MD  STRATTERA 80 MG capsule Take 80 mg by mouth daily.  06/16/14  Yes Historical Provider, MD  topiramate (TOPAMAX) 200 MG tablet Take 200 mg by mouth daily.   Yes Historical Provider, MD  topiramate (TOPAMAX) 25 MG tablet Take 100 mg by mouth every evening.   Yes Historical Provider, MD  triamterene-hydrochlorothiazide (MAXZIDE-25) 37.5-25 MG per tablet Take 1 tablet by mouth daily.   Yes Historical Provider, MD  valACYclovir (VALTREX) 1000 MG tablet Take 1,000 mg by mouth daily as needed (for flare ups).   Yes Historical Provider, MD  Vitamin D, Ergocalciferol, (DRISDOL) 50000 UNITS CAPS capsule Take 50,000 Units by mouth every 7 (seven) days.   Yes Historical Provider, MD  VITAMIN E PO Take 1 tablet by mouth daily.   Yes Historical Provider, MD   BP 108/56 mmHg  Pulse 78  Temp(Src) 97.3 F (36.3 C)  Resp 16  SpO2 100% Physical Exam  Constitutional: She is oriented to person, place, and time. She appears well-developed and well-nourished.  HENT:  Head: Normocephalic and atraumatic.  Patient requested evaluation of her left ear, as she states that she has a recent ear infection.  Tympanic membranes appears normal, there is minor erythema about the external auditory canal.  Eyes: Conjunctivae and EOM are normal.  Cardiovascular: Normal rate and regular rhythm.   Pulmonary/Chest: Effort  normal and breath sounds normal. No stridor. No respiratory distress.  Abdominal: She exhibits no distension.  Musculoskeletal: She exhibits no edema.  Neurological: She is alert and oriented to person, place, and time. No cranial nerve deficit.  Patient has equal strength and sensation in both upper and lower extremities, speech is clear, face is symmetric, she follows commands appropriately, and there is no discoordination.  Skin: Skin is warm and dry.  Psychiatric: She has a normal mood and affect.  Nursing note and vitals reviewed.   ED Course  Procedures (including critical care time) Labs Review Labs Reviewed  COMPREHENSIVE METABOLIC PANEL - Abnormal; Notable for the following:    Sodium 129 (*)    Potassium  3.4 (*)    Chloride 89 (*)    AST 43 (*)    GFR calc non Af Amer 89 (*)    All other components within normal limits  CBC WITH DIFFERENTIAL    Imaging Review Dg Pelvis 1-2 Views  07/18/2014   CLINICAL DATA:  Fall.  EXAM: PELVIS - 1-2 VIEW  COMPARISON:  None.  FINDINGS: Mild degenerative changes noted. There is no acute fracture or subluxation.  IMPRESSION: 1. No acute findings.   Electronically Signed   By: Signa Kell M.D.   On: 07/18/2014 23:43     EKG Interpretation   Date/Time:  Tuesday July 18 2014 19:52:19 EST Ventricular Rate:  86 PR Interval:  178 QRS Duration: 88 QT Interval:  394 QTC Calculation: 471 R Axis:   46 Text Interpretation:  Normal sinus rhythm Possible Lateral infarct , age  undetermined Possible Inferior infarct , age undetermined Abnormal ECG  Sinus rhythm Artifact Abnormal ekg Confirmed by Gerhard Munch  MD  [phone]) on 07/18/2014 10:54:59 PM       On repeat exam the patient appears more comfortable, eyes are awake, she is having more upright, speaking clearly. She states that she has heartburn-like symptoms, which are typical for her.  MDM   Final diagnoses:  Fall    Patient with multiple medical palms presents after  an episode of near syncope with minor fall.  Patient is awake, alert, in no distress, though she appears uncomfortable. Patient's headache improved here with her typical medication, she had no new neurologic deficits, she remained hemodynamically stable.  After fluid resuscitation for minor laterally around this, she felt generally better.  She is discharged in stable condition to follow up with her primary care team upon return home.    Gerhard Munch, MD 07/19/14 [phone]

## 2014-10-04 ENCOUNTER — Encounter: Attending: Family | Primary: Family Medicine

## 2015-03-13 ENCOUNTER — Inpatient Hospital Stay
Admit: 2015-03-13 | Discharge: 2015-03-13 | Disposition: A | Discharge status: Home / Self Care | Payer: MEDICARE | Attending: Emergency Medicine

## 2015-03-13 DIAGNOSIS — M5126 Other intervertebral disc displacement, lumbar region: Secondary | ICD-10-CM

## 2015-03-13 LAB — METABOLIC PANEL, COMPREHENSIVE
A-G Ratio: 1 — ABNORMAL LOW (ref 1.1–2.2)
ALT (SGPT): 220 U/L — ABNORMAL HIGH (ref 12–78)
AST (SGOT): 366 U/L — ABNORMAL HIGH (ref 15–37)
Albumin: 3.4 g/dL — ABNORMAL LOW (ref 3.5–5.0)
Alk. phosphatase: 111 U/L (ref 45–117)
Anion gap: 6 mmol/L (ref 5–15)
BUN/Creatinine ratio: 16 (ref 12–20)
BUN: 10 MG/DL (ref 6–20)
Bilirubin, total: 0.7 MG/DL (ref 0.2–1.0)
CO2: 31 mmol/L (ref 21–32)
Calcium: 8 MG/DL — ABNORMAL LOW (ref 8.5–10.1)
Chloride: 97 mmol/L (ref 97–108)
Creatinine: 0.62 MG/DL (ref 0.55–1.02)
GFR est AA: >60 mL/min/{1.73_m2} (ref >60)
GFR est non-AA: >60 mL/min/{1.73_m2} (ref >60)
Globulin: 3.4 g/dL (ref 2.0–4.0)
Glucose: 95 mg/dL (ref 65–100)
Potassium: 3.4 mmol/L — ABNORMAL LOW (ref 3.5–5.1)
Protein, total: 6.8 g/dL (ref 6.4–8.2)
Sodium: 134 mmol/L — ABNORMAL LOW (ref 136–145)

## 2015-03-13 LAB — CBC WITH AUTOMATED DIFF
ABS. BASOPHILS: 0 10*3/uL (ref 0.0–0.1)
ABS. EOSINOPHILS: 0.1 10*3/uL (ref 0.0–0.4)
ABS. LYMPHOCYTES: 1.3 10*3/uL (ref 0.8–3.5)
ABS. MONOCYTES: 0.4 10*3/uL (ref 0.0–1.0)
ABS. NEUTROPHILS: 2.7 10*3/uL (ref 1.8–8.0)
BASOPHILS: 0 % (ref 0–1)
EOSINOPHILS: 1 % (ref 0–7)
HCT: 36.6 % (ref 35.0–47.0)
HGB: 12.8 g/dL (ref 11.5–16.0)
LYMPHOCYTES: 28 % (ref 12–49)
MCH: 34 PG (ref 26.0–34.0)
MCHC: 35 g/dL (ref 30.0–36.5)
MCV: 97.1 FL (ref 80.0–99.0)
MONOCYTES: 10 % (ref 5–13)
NEUTROPHILS: 61 % (ref 32–75)
PLATELET: 196 10*3/uL (ref 150–400)
RBC: 3.77 M/uL — ABNORMAL LOW (ref 3.80–5.20)
RDW: 12.3 % (ref 11.5–14.5)
WBC: 4.5 10*3/uL (ref 3.6–11.0)

## 2015-03-13 LAB — MAGNESIUM: Magnesium: 2.1 mg/dL (ref 1.6–2.4)

## 2015-03-13 LAB — CK W/ CKMB & INDEX
CK - MB: 1.8 NG/ML (ref 0.5–3.6)
CK-MB Index: 2.8 — ABNORMAL HIGH (ref 0–2.5)
CK: 65 U/L (ref 26–192)

## 2015-03-13 LAB — TROPONIN I: Troponin-I, Qt.: <0.04 ng/mL (ref <0.05)

## 2015-03-13 MED ORDER — ONDANSETRON 4 MG TAB, RAPID DISSOLVE
4 mg | ORAL | Status: AC
Start: 2015-03-13 — End: 2015-03-13
  Administered 2015-03-13: 12:00:00 via ORAL

## 2015-03-13 MED ORDER — DIAZEPAM 5 MG TAB
5 mg | ORAL | Status: AC
Start: 2015-03-13 — End: 2015-03-13
  Administered 2015-03-13: 12:00:00 via ORAL

## 2015-03-13 MED ORDER — HYDROMORPHONE 2 MG TAB
2 mg | ORAL_TABLET | ORAL | Status: DC | PRN
Start: 2015-03-13 — End: 2015-11-14

## 2015-03-13 MED ORDER — PROCHLORPERAZINE EDISYLATE 5 MG/ML INJECTION
5 mg/mL | INTRAMUSCULAR | Status: AC
Start: 2015-03-13 — End: 2015-03-13
  Administered 2015-03-13: 17:00:00 via INTRAVENOUS

## 2015-03-13 MED ORDER — FENTANYL CITRATE (PF) 50 MCG/ML IJ SOLN
50 mcg/mL | INTRAMUSCULAR | Status: AC
Start: 2015-03-13 — End: 2015-03-13
  Administered 2015-03-13: 17:00:00 via INTRAVENOUS

## 2015-03-13 MED ORDER — DEXAMETHASONE SODIUM PHOSPHATE 10 MG/ML IJ SOLN
10 mg/mL | INTRAMUSCULAR | Status: AC
Start: 2015-03-13 — End: 2015-03-13
  Administered 2015-03-13: 20:00:00 via INTRAVENOUS

## 2015-03-13 MED ORDER — DIAZEPAM 5 MG TAB
5 mg | ORAL_TABLET | Freq: Four times a day (QID) | ORAL | Status: DC | PRN
Start: 2015-03-13 — End: 2017-03-04

## 2015-03-13 MED ORDER — HYDROMORPHONE (PF) 1 MG/ML IJ SOLN
1 mg/mL | INTRAMUSCULAR | Status: AC
Start: 2015-03-13 — End: 2015-03-13
  Administered 2015-03-13: 14:00:00 via INTRAVENOUS

## 2015-03-13 MED ORDER — ONDANSETRON (PF) 4 MG/2 ML INJECTION
4 mg/2 mL | INTRAMUSCULAR | Status: AC
Start: 2015-03-13 — End: 2015-03-13
  Administered 2015-03-13: 14:00:00 via INTRAVENOUS

## 2015-03-13 MED ORDER — HYDROMORPHONE (PF) 1 MG/ML IJ SOLN
1 mg/mL | INTRAMUSCULAR | Status: AC
Start: 2015-03-13 — End: 2015-03-13
  Administered 2015-03-13: 12:00:00 via INTRAMUSCULAR

## 2015-03-13 MED ORDER — SODIUM CHLORIDE 0.9% BOLUS IV
0.9 % | Freq: Once | INTRAVENOUS | Status: DC
Start: 2015-03-13 — End: 2015-03-13

## 2015-03-13 MED FILL — ONDANSETRON 4 MG TAB, RAPID DISSOLVE: 4 mg | ORAL | Qty: 1

## 2015-03-13 MED FILL — ONDANSETRON (PF) 4 MG/2 ML INJECTION: 4 mg/2 mL | INTRAMUSCULAR | Qty: 2

## 2015-03-13 MED FILL — PROCHLORPERAZINE EDISYLATE 5 MG/ML INJECTION: 5 mg/mL | INTRAMUSCULAR | Qty: 2

## 2015-03-13 MED FILL — FENTANYL CITRATE (PF) 50 MCG/ML IJ SOLN: 50 mcg/mL | INTRAMUSCULAR | Qty: 2

## 2015-03-13 MED FILL — DIAZEPAM 5 MG TAB: 5 mg | ORAL | Qty: 1

## 2015-03-13 MED FILL — DEXAMETHASONE SODIUM PHOSPHATE 10 MG/ML IJ SOLN: 10 mg/mL | INTRAMUSCULAR | Qty: 1

## 2015-03-13 MED FILL — SODIUM CHLORIDE 0.9 % IV: INTRAVENOUS | Qty: 500

## 2015-03-13 MED FILL — HYDROMORPHONE (PF) 1 MG/ML IJ SOLN: 1 mg/mL | INTRAMUSCULAR | Qty: 2

## 2015-03-13 MED FILL — HYDROMORPHONE (PF) 1 MG/ML IJ SOLN: 1 mg/mL | INTRAMUSCULAR | Qty: 1

## 2015-03-13 NOTE — Progress Notes (Signed)
Care Management ED Assessment    **CM Consult for DME:  Rolling walker**    Katherine [name] is a 59 y.o. female who presented ambulatory to [facility] ED with complaints of severe back pain and left hip pain x 2 weeks secondary to a fall. Of note, pt's family reports pt has fallen 4 times since February 2015  She states her current pain comes from her most recent fall, this time 2 weeks ago where she had elevated her foot onto the bed to be able to tie her shoes. Pt states she lost balance and fell on and sustained "injuries" to her lower back and left hip.   ED workup:  Xray of spine, Xray of hip - no acute injuries.  Senior ED PT worked with patient and recommended rolling walker.  Discussed at length with patient and daughter, discussed hiring personal care or having family assist with personal care in the home.  Patient is reluctant to leave home as her dogs are in the home with her.  Verified face sheet and demographics.     PCP: Adalberto Cole, NP    PMH:  Lupus, back pain, chronic head pain    Care Management Interventions  PCP Verified by CM: Yes Johny Blamer, NP )  Palliative Care Consult: No  Transition of Care Consult (CM Consult): Discharge Planning, Other (DME equipment:  Rolling walker, list of outpatient Physical Therapy Centers given to patient, she isn't home bound.)  MyChart Signup: No  Discharge Durable Medical Equipment: Yes Gilford Rile)  Physical Therapy Consult: Yes (Senior ED PT Consult)  Occupational Therapy Consult: No  Speech Therapy Consult: No  Current Support Network: Lives Alone (Lives Alone, drives, independent prior to admission.)  Plan discussed with Pt/Family/Caregiver: Yes (Met with patient in ED/24.  Order received for rolling walker. )  Discharge Location  Discharge Placement: Home     DME order sent to [facility] medical Supply via [employer] referral system, ordered rolling walker.    Patient and daughter were given list of Physical Therapy (Outpatient)      Francoise Schaumann, RN, BSN, [facility]   ED Care Management  [phone]
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
## 2015-03-13 NOTE — ED Notes (Signed)
PT return from CT. Placed back on monitor, no needs at this time.

## 2015-03-13 NOTE — Progress Notes (Cosign Needed Addendum)
physical Therapy Emergency Department EVALUATION/DISCHARGE with CMS G codes  Patient: Katherine [name] (59 y.o. female)  Date: 03/13/2015  Primary Diagnosis: There are no admission diagnoses documented for this encounter.        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  Pt is s/p fall 2 weeks ago when she lost her balance with one foot up on stool to tie her shoe.?? She states she tried heat and meds for relief but has not found it. Her dtr in law is present as well. Pt states at baseline her gait is unsteady but that her bouts with the Miniere's have gotten better over last 6 months. Pt states it has been years since any rehab. She has been completing ADLs on her own and lives alone with family support who live next door.  Asked now by Dr. Tamala Julian to eval mobility.  Pt states that since second dose of medication her pain is 3-4/5.  Strength is difficult to assess as pt states "they don't move" when asked for AROM ankle motion and does not take any resist, but exhibits no foot drop in gait and actively moves during activity; no knee buckling in gait noted, hip stable without trendelenburg.  She requires SBA to min A for bed mobility.  She transfers to stand with CGA.  She is unsteady in standing without support.  First trial of amb is with HHA of 2 with mod trunk sway and inconsistent balance reactions.  Trained with use of RW and pt able to amb with CGA and cues only.  Pt and dtr in law agreed pt is safer at this time with use of walker.  Pt c/o 6-7/10 pain post amb.  It appears there may be some behavioral component to pt's performance and report, difficult to separate some of the complaints and reports with previous or current actual function. There do appear to be significant  residual balance issues related to her Miniere's and its treatment with ablation, as well as some unsteadiness due to pain meds at this time.  Recommended family stay with pt today and monitor at home. Would recommend f/u with OPPT for back and pain management and for balance issues. Provided pt with area OPPT clinics and [facility] OP clinics at pt request.  Rounded with Dr. Tamala Julian and she will address with pt as well and provide script as needed.  Referred to care management for obtaining RW for pt.      Further acute physical therapy is not indicated at this time.     PLAN :  Discharge Recommendations:        Home with family     Skilled nursing facility     Admission to hospital with rehab likely needed     Inpatient rehab referral     Outpatient physical therapy referral     Other:    Further Equipment Recommendations for Discharge:      Rolling walker with 5" wheels     Crutches      Cane      Wheelchair      Other:     COMMUNICATION/EDUCATION:   Communication/Collaboration:     Fall prevention education was provided and the patient/caregiver indicated understanding. Provided home  safety checklist.     Patient/family have participated as able and agree with findings and recommendations.     Patient is unable to participate in plan of care at this time.  Findings and recommendations were discussed with: MD physician and care manager       SUBJECTIVE:   Patient stated ???It's in my L back and hip and to my knee, it burns.???    OBJECTIVE DATA SUMMARY:     Past Medical History   Diagnosis Date   ??? Other ill-defined conditions(799.89)      shingles, lupus, menieres, Fibromyalgia   ??? Autoimmune disease ([place])    ??? Chronic pain    ??? Depression    ??? Arthritis    ??? Headache(784.0)    ??? Burning with urination    ??? Bowel disease    ??? Sweats, menopausal    ??? Night sweats    ??? Visual loss    ??? Hearing loss    ??? Diarrhea    ??? Dizziness    ??? Numbness    ??? Easy bruising    ??? Difficult intubation       difficulty breathing after awakening in past.     Past Surgical History   Procedure Laterality Date   ??? Hx gyn       hysterectomy   ??? Hx transplant       right corona   ??? Hx heent       right eye corona transplant, both ears, tonsillectomy   ??? Hx appendectomy     ??? Hx urological       bladder surgery x2   ??? Hx orthopaedic       back x2, right knee   ??? Hx orthopaedic  01-2014      right hand surgery   ??? Hx cholecystectomy     ??? Pr abdomen surgery proc unlisted  1994     1 foot of colon removed   ??? Hx hernia repair       Prior Level of Function/Home Situation: s/p fall 2 weeks ago when she lost her balance with one foot up on stool to tie her shoe.?? She states she tried heat and meds for relief but has not found it. She currently states she is in extreme pain especially burning pain at her L hip.?? Her dtr is present as well.?? Pt does have hx of 4 falls since Feb.?? One serious down attic steps trying to carry heavy box.?? Pt has hx of Miniere's disease and has had several procedures related to it including gentomycin to R ear and shunt in L with rehab after gentomycin.?? Pt states at baseline her gait is unsteady but that her bouts with the Miniere's have gotten better over last 6 months. Pt states it has been years since any rehab.  Home Situation  Home Environment: Private residence  # Steps to Enter: 2  One/Two Story Residence: One story  Living Alone: Yes  Support Systems: Family member(s), Child(ren)  Patient Expects to be Discharged to:: Private residence  Current DME Used/Available at Home: Kasandra Knudsen, straight     Critical Behavior:  Neurologic State: Alert  Orientation Level: Oriented X4  Cognition: Follows commands       Strength:  See above notes  Range Of Motion: WFL throughout extremities                Functional Mobility:  Bed Mobility:  Rolling: Stand-by asssistance  Supine to Sit: Minimum assistance  Sit to Supine: Stand-by asssistance     Transfers:   Sit to Stand: Contact guard assistance  Stand to Sit: Contact guard assistance                       Balance:   Sitting: Intact  Standing: Impaired  Standing - Static: Poor;Other (comment) (poor w/o support; fair with use of RW)  Standing - Dynamic :  (poor without support; fair with RW)  Ambulation/Gait Training:  Distance (ft): 15 Feet (ft) (x2)  Assistive Device: Walker, rolling;Other (comment) (initially HHA then training with RW)  Ambulation - Level of Assistance: Minimal assistance;Assist x2;Other (comment);Contact guard assistance (min A of 2 w/o device; CGA of 1 with RW)        Gait Abnormalities: Trunk sway increased        Base of Support: Other (comment) (variable)     Speed/Cadence: Slow  Step Length: Other (comment) (variable)  Swing Pattern: Other (comment) (variable)          Functional Measure:  Tinetti test:    Sitting Balance: 1  Arises: 1  Attempts to Rise: 2  Immediate Standing Balance: 1  Standing Balance: 1  Nudged: 1  Eyes Closed: 0  Turn 360 Degrees - Continuous/Discontinuous: 0  Turn 360 Degrees - Steady/Unsteady: 0  Sitting Down: 1  Balance Score: 8  Indication of Gait: 0  R Step Length/Height: 1  L Step Length/Height: 1  R Foot Clearance: 1  L Foot Clearance: 1  Step Symmetry: 0  Step Continuity: 0  Path: 1  Trunk: 0  Walking Time: 0  Gait Score: 5  Total Score: 13       Tinetti Test and G-code impairment scale:  Percentage of Impairment CH    0%   CI    1-19% CJ    20-39% CK    40-59% CL    60-79% CM    80-99% CN     100%   Tinetti  Score 0-28 28 23-27 17-22 12-16 6-11 1-5 0       Tinetti Tool Score Risk of Falls  <19 = High Fall Risk  19-24 = Moderate Fall Risk  25-28 = Low Fall Risk  Tinetti ME. Performance-Oriented Assessment of Mobility Problems in Elderly Patients. [place]; [id number]. (Scoring Description: PT Bulletin Feb. 10, 1993)    Older adults: Letta Moynahan et al, 2009; n = [place] elderly evaluated with ABC, POMA, ADL, and IADL)   ?? Mean POMA score for males aged 64-79 years = 26.21(3.40)  ?? Mean POMA score for females age 28-79 years = 25.16(4.30)  ?? Mean POMA score for males over 80 years = 23.29(6.02)  ?? Mean POMA score for females over 80 years = 17.20(8.32)       In compliance with CMS???s Claims Based Outcome Reporting, the following G-code set was chosen for this patient based on their primary functional limitation being treated:    The outcome measure chosen to determine the severity of the functional limitation was the tinetti with a score of 13/28 which was correlated with the impairment scale.    ? Mobility - Walking and Moving Around:    Z6109 - CURRENT STATUS: CK - 40%-59% impaired, limited or restricted   U0454 -  GOAL STATUS:  CK - 40%-59% impaired, limited or restricted   G8980 - D/C STATUS:  CK - 40%-59% impaired, limited or restricted   Pain:  Pain Scale 1: Numeric (0 - 10)  Pain Intensity 1: 10  Pain Location 1: Back  Pain Orientation 1: Lower;Left;Right  Pain Description 1: Sharp;Shooting     Activity Tolerance:   See above    Please refer to the flowsheet for vital signs taken during this treatment.  After treatment:            Patient left in no apparent distress sitting up in chair           Patient left in no apparent distress in bed           Call bell left within reach           Nursing notified           Caregiver present           Bed alarm activated        Thank you for this referral.  Jodell Cipro Doornik, PT   Time Calculation: 24 mins

## 2015-03-13 NOTE — ED Notes (Signed)
I have reviewed discharge instructions with the patient.  The patient verbalized understanding. To triage via wheelchair. NO questions or request at time of release.

## 2015-03-13 NOTE — Progress Notes (Signed)
Physical Therapy Screening:  A referral to physical therapy order is indicated when the patient is medically stable.    A screening was performed on this patient's chart based on their entrance into the emergency department and potential need for physical therapy identified. The patient???s chart was reviewed and the patient interviewed/screened and found to be appropriate for a skilled therapy evaluation.     Pt to ED with back pain and L hip pain s/p fall 2 weeks ago when she lost her balance with one foot up on stool to tie her shoe.  She states she tried heat and meds for relief but has not found it. She currently states she is in extreme pain especially burning pain at her L hip.  Her dtr is present as well.  Pt does have hx of 4 falls since Feb.  One serious down attic steps trying to carry heavy box.  Pt has hx of Miniere's disease and has had several procedures related to it including gentomycin to R ear and shunt in L with rehab after gentomycin.  Pt states at baseline her gait is unsteady but that her bouts with the Miniere's have gotten better over last 6 months. Pt states it has been years since any rehab.  Discussed possibility of OPPT to address both the back pain issues and balance issues especially given pt states she has become more afraid of falling.  Given her back surgery/back pain hx, Miniere's hx and it's treatment with gentomycin, and her stated fear of falls, pt is at risk of cycle of decreased activity, decreased strength, decreased balance, increased falls.  OPPT would be highly recommended.  Rounded with Dr. Tamala Julian.  Dr. Tamala Julian has placed consult for PT eval in the ED but pt currentlly in too much pain to tolerate mobility and eval.  Dr. Tamala Julian working on managing pain.  If and when pt can tolerate, will follow with eval.       Thank you.  Chalmers Guest Doornik, PT

## 2015-03-14 LAB — HEPATITIS PANEL, ACUTE
Hep B surface Ag Interp.: NEGATIVE
Hep C virus Ab Interp.: NONREACTIVE
Hepatitis A, IgM: NONREACTIVE
Hepatitis B core, IgM: NONREACTIVE
Hepatitis B surface Ag: <0.1 Index

## 2015-03-16 ENCOUNTER — Encounter: Attending: Surgery | Primary: Family Medicine

## 2015-03-19 ENCOUNTER — Encounter

## 2015-03-29 ENCOUNTER — Inpatient Hospital Stay: Payer: MEDICARE | Attending: Orthopaedic Surgery | Primary: Family Medicine

## 2015-05-10 ENCOUNTER — Encounter

## 2015-06-20 ENCOUNTER — Inpatient Hospital Stay: Admit: 2015-06-20 | Payer: MEDICARE | Primary: Family Medicine

## 2015-06-20 DIAGNOSIS — Z1231 Encounter for screening mammogram for malignant neoplasm of breast: Secondary | ICD-10-CM

## 2015-10-10 IMAGING — CR DG PELVIS 1-2V
1 series · 1 of 1 positions shown · non-contrast
Comparison: None.

CLINICAL DATA: Fall.

EXAM:
PELVIS - 1-2 VIEW

[pelvis ap]
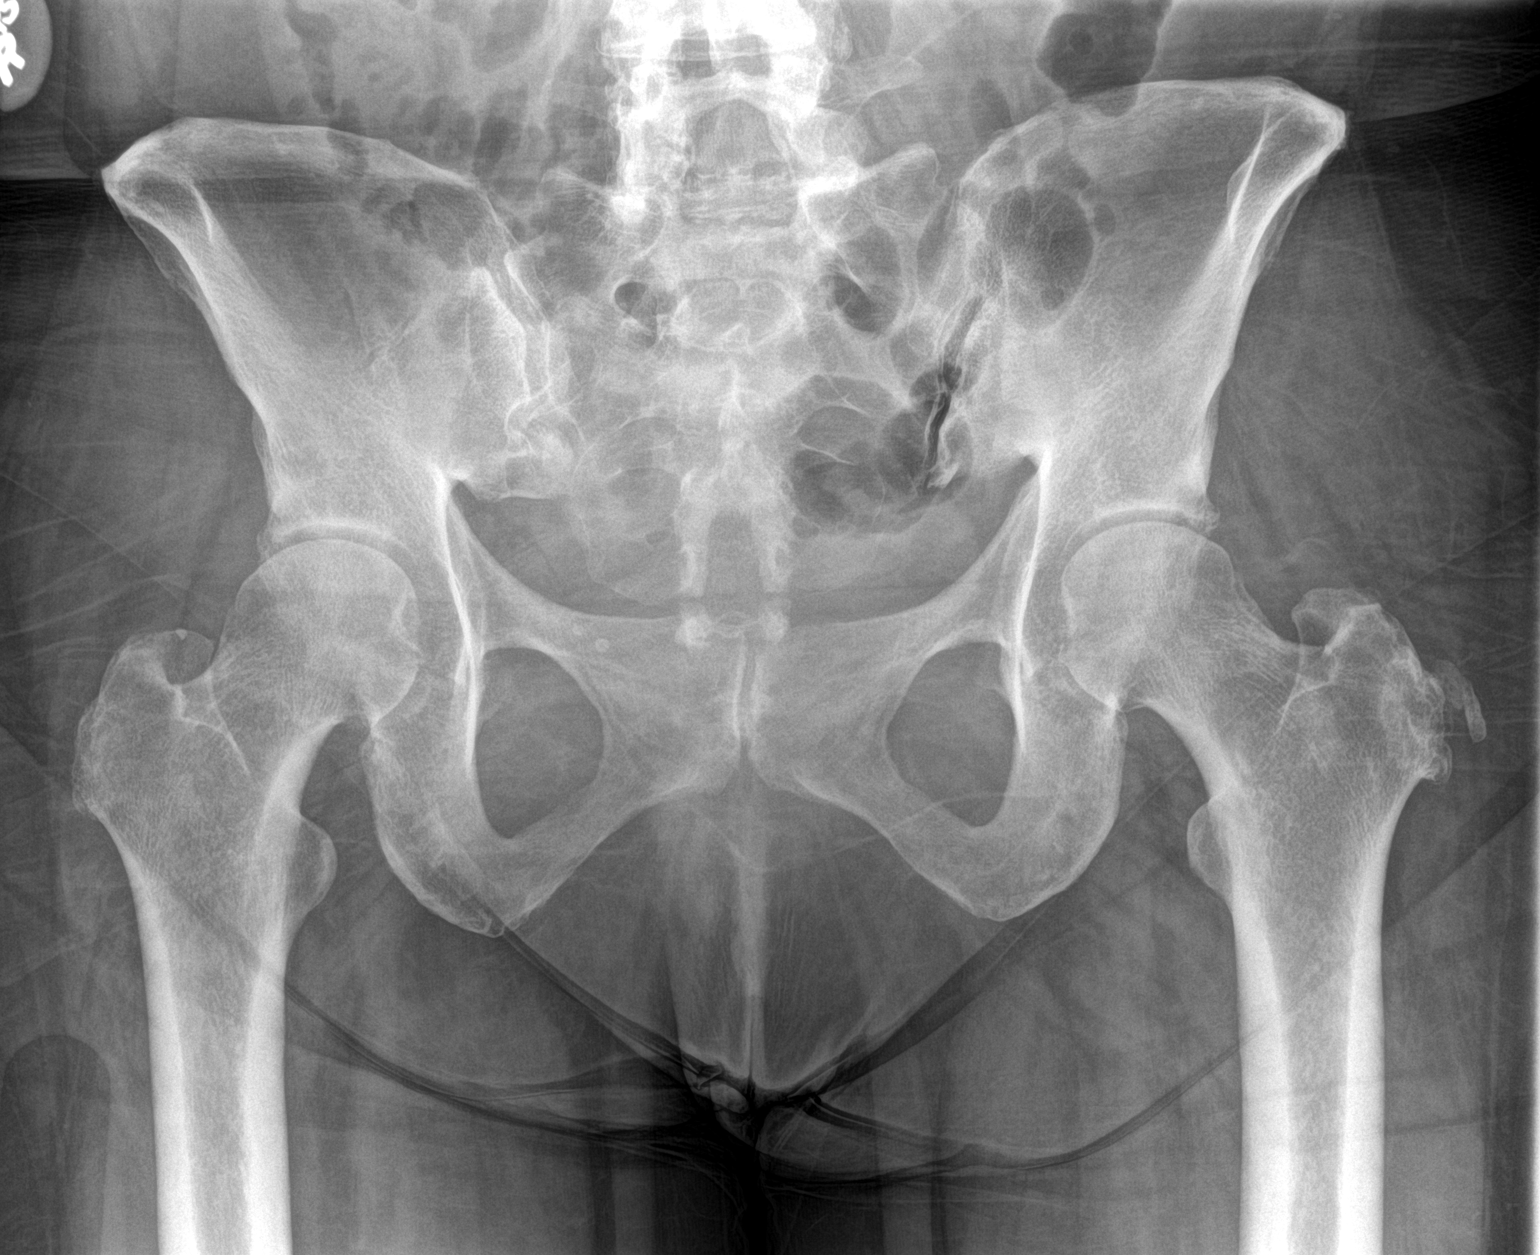

[1 of 1 positions shown; findings below may reference images not displayed]

FINDINGS: Mild degenerative changes noted. There is no acute fracture or
subluxation.
IMPRESSION: 1. No acute findings.

## 2015-11-14 ENCOUNTER — Emergency Department: Admit: 2015-11-14 | Payer: MEDICARE | Primary: Family Medicine

## 2015-11-14 ENCOUNTER — Inpatient Hospital Stay
Admit: 2015-11-14 | Discharge: 2015-11-14 | Disposition: A | Discharge status: Home / Self Care | Payer: MEDICARE | Attending: Emergency Medicine

## 2015-11-14 DIAGNOSIS — S060X0A Concussion without loss of consciousness, initial encounter: Secondary | ICD-10-CM

## 2015-11-14 LAB — CBC WITH AUTOMATED DIFF
ABS. BASOPHILS: 0 10*3/uL (ref 0.0–0.1)
ABS. EOSINOPHILS: 0 10*3/uL (ref 0.0–0.4)
ABS. LYMPHOCYTES: 0.7 10*3/uL — ABNORMAL LOW (ref 0.8–3.5)
ABS. MONOCYTES: 0.3 10*3/uL (ref 0.0–1.0)
ABS. NEUTROPHILS: 1.5 10*3/uL — ABNORMAL LOW (ref 1.8–8.0)
BASOPHILS: 0 % (ref 0–1)
EOSINOPHILS: 1 % (ref 0–7)
HCT: 39.9 % (ref 35.0–47.0)
HGB: 14.6 g/dL (ref 11.5–16.0)
LYMPHOCYTES: 29 % (ref 12–49)
MCH: 33.9 PG (ref 26.0–34.0)
MCHC: 36.6 g/dL — ABNORMAL HIGH (ref 30.0–36.5)
MCV: 92.6 FL (ref 80.0–99.0)
MONOCYTES: 11 % (ref 5–13)
NEUTROPHILS: 59 % (ref 32–75)
PLATELET: 234 10*3/uL (ref 150–400)
RBC: 4.31 M/uL (ref 3.80–5.20)
RDW: 11.6 % (ref 11.5–14.5)
WBC: 2.5 10*3/uL — ABNORMAL LOW (ref 3.6–11.0)

## 2015-11-14 LAB — URINALYSIS W/ RFLX MICROSCOPIC
Bacteria: NEGATIVE /hpf
Bilirubin: NEGATIVE
Blood: NEGATIVE
Glucose: NEGATIVE mg/dL
Ketone: NEGATIVE mg/dL
Nitrites: NEGATIVE
Protein: NEGATIVE mg/dL
Specific gravity: 1.013 (ref 1.003–1.030)
Urobilinogen: 1 EU/dL (ref 0.2–1.0)
pH (UA): 6.5 (ref 5.0–8.0)

## 2015-11-14 LAB — EKG, 12 LEAD, INITIAL
Atrial Rate: 83 {beats}/min
Calculated P Axis: 39 degrees
Calculated R Axis: 30 degrees
Calculated T Axis: 29 degrees
Diagnosis: NORMAL
P-R Interval: 172 ms
Q-T Interval: 410 ms
QRS Duration: 102 ms
QTC Calculation (Bezet): 481 ms
Ventricular Rate: 83 {beats}/min

## 2015-11-14 LAB — METABOLIC PANEL, COMPREHENSIVE
A-G Ratio: 1.3 (ref 1.1–2.2)
ALT (SGPT): 21 U/L (ref 12–78)
AST (SGOT): 40 U/L — ABNORMAL HIGH (ref 15–37)
Albumin: 4.2 g/dL (ref 3.5–5.0)
Alk. phosphatase: 89 U/L (ref 45–117)
Anion gap: 12 mmol/L (ref 5–15)
BUN/Creatinine ratio: 11 — ABNORMAL LOW (ref 12–20)
BUN: 8 MG/DL (ref 6–20)
Bilirubin, total: 0.4 MG/DL (ref 0.2–1.0)
CO2: 25 mmol/L (ref 21–32)
Calcium: 8.7 MG/DL (ref 8.5–10.1)
Chloride: 92 mmol/L — ABNORMAL LOW (ref 97–108)
Creatinine: 0.74 MG/DL (ref 0.55–1.02)
GFR est AA: >60 mL/min/{1.73_m2} (ref >60)
GFR est non-AA: >60 mL/min/{1.73_m2} (ref >60)
Globulin: 3.3 g/dL (ref 2.0–4.0)
Glucose: 95 mg/dL (ref 65–100)
Potassium: 3.4 mmol/L — ABNORMAL LOW (ref 3.5–5.1)
Protein, total: 7.5 g/dL (ref 6.4–8.2)
Sodium: 129 mmol/L — ABNORMAL LOW (ref 136–145)

## 2015-11-14 LAB — PTT: aPTT: 29.5 s (ref 22.1–32.5)

## 2015-11-14 LAB — PROTHROMBIN TIME + INR
INR: 1 (ref 0.9–1.1)
Prothrombin time: 10.3 s (ref 9.0–11.1)

## 2015-11-14 LAB — CK: CK: 65 U/L (ref 26–192)

## 2015-11-14 LAB — TROPONIN I: Troponin-I, Qt.: <0.04 ng/mL (ref <0.05)

## 2015-11-14 MED ORDER — SODIUM CHLORIDE 0.9 % INJECTION
5 mg/mL | INTRAMUSCULAR | Status: AC
Start: 2015-11-14 — End: 2015-11-14
  Administered 2015-11-14: 18:00:00 via INTRAVENOUS

## 2015-11-14 MED ORDER — HYDROMORPHONE (PF) 1 MG/ML IJ SOLN
1 mg/mL | INTRAMUSCULAR | Status: AC
Start: 2015-11-14 — End: 2015-11-14
  Administered 2015-11-14: 15:00:00 via INTRAVENOUS

## 2015-11-14 MED ORDER — SODIUM CHLORIDE 0.9% BOLUS IV
0.9 % | INTRAVENOUS | Status: DC
Start: 2015-11-14 — End: 2015-11-14

## 2015-11-14 MED ORDER — OXYCODONE-ACETAMINOPHEN 5 MG-325 MG TAB
5-325 mg | ORAL_TABLET | ORAL | 0 refills | Status: DC | PRN
Start: 2015-11-14 — End: 2017-03-04

## 2015-11-14 MED ORDER — ONDANSETRON (PF) 4 MG/2 ML INJECTION
4 mg/2 mL | INTRAMUSCULAR | Status: AC
Start: 2015-11-14 — End: 2015-11-14
  Administered 2015-11-14: 16:00:00 via INTRAVENOUS

## 2015-11-14 MED ORDER — FAMOTIDINE (PF) 20 MG/2 ML IV
202 mg/2 mL | INTRAVENOUS | Status: AC
Start: 2015-11-14 — End: 2015-11-14
  Administered 2015-11-14: 16:00:00 via INTRAVENOUS

## 2015-11-14 MED ORDER — FENTANYL CITRATE (PF) 50 MCG/ML IJ SOLN
50 mcg/mL | INTRAMUSCULAR | Status: AC
Start: 2015-11-14 — End: 2015-11-14
  Administered 2015-11-14: 16:00:00 via INTRAVENOUS

## 2015-11-14 MED ORDER — ONDANSETRON (PF) 4 MG/2 ML INJECTION
4 mg/2 mL | INTRAMUSCULAR | Status: AC
Start: 2015-11-14 — End: 2015-11-14
  Administered 2015-11-14: 15:00:00 via INTRAVENOUS

## 2015-11-14 MED ORDER — FENTANYL CITRATE (PF) 50 MCG/ML IJ SOLN
50 mcg/mL | INTRAMUSCULAR | Status: AC
Start: 2015-11-14 — End: 2015-11-14
  Administered 2015-11-14: 18:00:00 via INTRAVENOUS

## 2015-11-14 MED ORDER — ONDANSETRON HCL 4 MG TAB
4 mg | ORAL_TABLET | Freq: Three times a day (TID) | ORAL | 0 refills | Status: DC | PRN
Start: 2015-11-14 — End: 2017-03-04

## 2015-11-14 MED FILL — FAMOTIDINE (PF) 20 MG/2 ML IV: 20 mg/2 mL | INTRAVENOUS | Qty: 2

## 2015-11-14 MED FILL — SODIUM CHLORIDE 0.9 % IV: INTRAVENOUS | Qty: 1000

## 2015-11-14 MED FILL — FENTANYL CITRATE (PF) 50 MCG/ML IJ SOLN: 50 mcg/mL | INTRAMUSCULAR | Qty: 2

## 2015-11-14 MED FILL — PROCHLORPERAZINE EDISYLATE 5 MG/ML INJECTION: 5 mg/mL | INTRAMUSCULAR | Qty: 2

## 2015-11-14 MED FILL — ONDANSETRON (PF) 4 MG/2 ML INJECTION: 4 mg/2 mL | INTRAMUSCULAR | Qty: 2

## 2015-11-14 MED FILL — HYDROMORPHONE (PF) 1 MG/ML IJ SOLN: 1 mg/mL | INTRAMUSCULAR | Qty: 1

## 2015-11-14 NOTE — ED Notes (Signed)
Pt. Back to unit from x-ray. Pt. Placed in position of comfort with call bell in reach. Pt. Updated on plan of care.

## 2015-11-14 NOTE — ED Provider Notes (Signed)
HPI Comments: Katherine [name] is a 60 y.o. female with history of Meniere's Disease and fibromyalgia who presents ambulatory with family member to ED c/o constant headache, along with associated dizziness, nausea, photophobia, and intermittent mild neck pain. Pt states she lost her balance and fell 2 days ago and hit her head on a wooden board in her garden. Pt states she went to the [facility] this morning, and was administered Decadron and Toradol, which improved her headache from 10/10 to 6/10. Pt notes her dizziness is worse than normal, and she fell again this morning landing on bilateral knees. Pt denies any fever, chills, chest pain, shortness of breath, vomiting, diarrhea, LOC.     PCP:  Johny Blamer, NP    There are no other complaints, changes or physical findings at this time.     The history is provided by the patient. No language interpreter was used.        Past Medical History:   Diagnosis Date   ??? Arthritis    ??? Autoimmune disease ([place])    ??? Bowel disease    ??? Burning with urination    ??? Chronic pain    ??? Depression    ??? Diarrhea    ??? Difficult intubation     difficulty breathing after awakening in past.   ??? Dizziness    ??? Easy bruising    ??? Headache(784.0)    ??? Hearing loss    ??? Night sweats    ??? Numbness    ??? Other ill-defined conditions(799.89)     shingles, lupus, menieres, Fibromyalgia   ??? Sweats, menopausal    ??? Visual loss        Past Surgical History:   Procedure Laterality Date   ??? ABDOMEN SURGERY PROC UNLISTED  1994    1 foot of colon removed   ??? HX APPENDECTOMY     ??? HX CHOLECYSTECTOMY     ??? HX GYN      hysterectomy   ??? HX HEENT      right eye corona transplant, both ears, tonsillectomy   ??? HX HERNIA REPAIR     ??? HX ORTHOPAEDIC      back x2, right knee   ??? HX ORTHOPAEDIC  01-2014     right hand surgery   ??? HX TRANSPLANT      right corona   ??? HX UROLOGICAL      bladder surgery x2         Family History:   Problem Relation Age of Onset   ??? Heart Disease Mother    ??? Cancer Mother       breast cancer   ??? Breast Cancer Mother 9   ??? Hypertension Father    ??? Diabetes Father    ??? Heart Disease Sister        Social History     Social History   ??? Marital status: WIDOWED     Spouse name: N/A   ??? Number of children: N/A   ??? Years of education: N/A     Occupational History   ??? Not on file.     Social History Main Topics   ??? Smoking status: Current Every Day Smoker     Packs/day: 1.00     Years: 30.00     Last attempt to quit: 11/16/2013   ??? Smokeless tobacco: Never Used   ??? Alcohol use No   ??? Drug use: No   ??? Sexual activity: Not on file  Other Topics Concern   ??? Not on file     Social History Narrative         ALLERGIES: Morphine; Other medication; and Pcn [penicillins]    Review of Systems   Constitutional: Negative for chills and fever.   Eyes: Positive for photophobia.   Respiratory: Negative for shortness of breath.    Cardiovascular: Negative for chest pain.   Gastrointestinal: Positive for nausea. Negative for diarrhea and vomiting.   Endocrine: Negative for heat intolerance.   Genitourinary: Negative for dysuria.   Musculoskeletal: Positive for neck pain.   Skin: Negative for rash.   Allergic/Immunologic: Negative for immunocompromised state.   Neurological: Positive for dizziness and headaches.   Hematological: Does not bruise/bleed easily.   Psychiatric/Behavioral: Negative.    All other systems reviewed and are negative.      Patient Vitals for the past 12 hrs:   Temp Pulse Resp BP SpO2   11/14/15 1500 - - - 114/51 97 %   11/14/15 1445 - - - 108/61 96 %   11/14/15 1415 - - - 102/61 97 %   11/14/15 1401 - - - 115/73 100 %   11/14/15 1345 - - - (!) 97/33 99 %   11/14/15 1332 - - - 123/63 100 %   11/14/15 1130 - - - (!) 131/100 99 %   11/14/15 1115 - - - 159/69 100 %   11/14/15 1000 - - - 124/68 98 %   11/14/15 0942 98.2 ??F (36.8 ??C) 89 20 113/78 99 %         Physical Exam   Constitutional: She is oriented to person, place, and time. She appears  well-developed and well-nourished. She appears distressed (mild).   HENT:   Head: Normocephalic and atraumatic.   Eyes: Pupils are equal, round, and reactive to light.   Photophobic. Patient declined EOM testing due to Meniere's.    Neck: Normal range of motion.   + neck tenderness    Cardiovascular: Normal rate, regular rhythm and normal heart sounds.    Pulmonary/Chest: Effort normal and breath sounds normal. No respiratory distress.   Abdominal: Soft. Bowel sounds are normal. She exhibits no mass. There is no tenderness.   Musculoskeletal: Normal range of motion. She exhibits no edema.   Bilateral calf tenderness. Decreased ROM in bilateral legs, which is baseline per patient.    Neurological: She is alert and oriented to person, place, and time. Coordination normal.   Skin: Skin is warm and dry.   Psychiatric: She has a normal mood and affect.   Nursing note and vitals reviewed.       MDM  Number of Diagnoses or Management Options  CHI (closed head injury), initial encounter:   Concussion, without loss of consciousness, initial encounter:   Diagnosis management comments: DDx: closed head injury, concussion, ICH, contusion, fracture, electrolyte abnormality, arrhythmia        Amount and/or Complexity of Data Reviewed  Clinical lab tests: reviewed and ordered  Tests in the radiology section of CPT??: reviewed and ordered  Tests in the medicine section of CPT??: ordered and reviewed  Review and summarize past medical records: yes  Independent visualization of images, tracings, or specimens: yes    Patient Progress  Patient progress: stable    ED Course       Procedures    EKG interpretation: (Preliminary) 9:55 AM  Rhythm: normal sinus rhythm; and regular . Rate (approx.): 83; Axis: normal; P wave: normal; QRS interval: normal ;  ST/T wave: normal; no significant change from previous EKG.   Written by Dondra Prader. Owens Shark, ED Scribe, as dictated by Seward Speck, MD.    Progress Note  3:30 PM     Seward Speck, MD has re-evaluated pt, and pt is sleeping.       LABORATORY TESTS:  Recent Results (from the past 12 hour(s))   EKG, 12 LEAD, INITIAL    Collection Time: 11/14/15  9:55 AM   Result Value Ref Range    Ventricular Rate 83 BPM    Atrial Rate 83 BPM    P-R Interval 172 ms    QRS Duration 102 ms    Q-T Interval 410 ms    QTC Calculation (Bezet) 481 ms    Calculated P Axis 39 degrees    Calculated R Axis 30 degrees    Calculated T Axis 29 degrees    Diagnosis       Normal sinus rhythm  Normal ECG  When compared with ECG of 04-Oct-2010 10:05,  No significant change was found  Confirmed by Tyson Babinski [phone]) on 11/14/2015 10:35:28 AM     CBC WITH AUTOMATED DIFF    Collection Time: 11/14/15 10:51 AM   Result Value Ref Range    WBC 2.5 (L) 3.6 - 11.0 K/uL    RBC 4.31 3.80 - 5.20 M/uL    HGB 14.6 11.5 - 16.0 g/dL    HCT 39.9 35.0 - 47.0 %    MCV 92.6 80.0 - 99.0 FL    MCH 33.9 26.0 - 34.0 PG    MCHC 36.6 (H) 30.0 - 36.5 g/dL    RDW 11.6 11.5 - 14.5 %    PLATELET 234 150 - 400 K/uL    NEUTROPHILS 59 32 - 75 %    LYMPHOCYTES 29 12 - 49 %    MONOCYTES 11 5 - 13 %    EOSINOPHILS 1 0 - 7 %    BASOPHILS 0 0 - 1 %    ABS. NEUTROPHILS 1.5 (L) 1.8 - 8.0 K/UL    ABS. LYMPHOCYTES 0.7 (L) 0.8 - 3.5 K/UL    ABS. MONOCYTES 0.3 0.0 - 1.0 K/UL    ABS. EOSINOPHILS 0.0 0.0 - 0.4 K/UL    ABS. BASOPHILS 0.0 0.0 - 0.1 K/UL    RBC COMMENTS NORMOCYTIC, NORMOCHROMIC      DF SMEAR SCANNED     METABOLIC PANEL, COMPREHENSIVE    Collection Time: 11/14/15 10:51 AM   Result Value Ref Range    Sodium 129 (L) 136 - 145 mmol/L    Potassium 3.4 (L) 3.5 - 5.1 mmol/L    Chloride 92 (L) 97 - 108 mmol/L    CO2 25 21 - 32 mmol/L    Anion gap 12 5 - 15 mmol/L    Glucose 95 65 - 100 mg/dL    BUN 8 6 - 20 MG/DL    Creatinine 0.74 0.55 - 1.02 MG/DL    BUN/Creatinine ratio 11 (L) 12 - 20      GFR est AA >60 >60 ml/min/1.65m    GFR est non-AA >60 >60 ml/min/1.753m   Calcium 8.7 8.5 - 10.1 MG/DL    Bilirubin, total 0.4 0.2 - 1.0 MG/DL     ALT (SGPT) 21 12 - 78 U/L    AST (SGOT) 40 (H) 15 - 37 U/L    Alk. phosphatase 89 45 - 117 U/L    Protein, total 7.5 6.4 - 8.2 g/dL    Albumin 4.2  3.5 - 5.0 g/dL    Globulin 3.3 2.0 - 4.0 g/dL    A-G Ratio 1.3 1.1 - 2.2     CK    Collection Time: 11/14/15 10:51 AM   Result Value Ref Range    CK 65 26 - 192 U/L   TROPONIN I    Collection Time: 11/14/15 10:51 AM   Result Value Ref Range    Troponin-I, Qt. <0.04 <0.05 ng/mL   PROTHROMBIN TIME + INR    Collection Time: 11/14/15 10:51 AM   Result Value Ref Range    INR 1.0 0.9 - 1.1      Prothrombin time 10.3 9.0 - 11.1 sec   PTT    Collection Time: 11/14/15 10:51 AM   Result Value Ref Range    aPTT 29.5 22.1 - 32.5 sec    aPTT, therapeutic range     58.0 - 77.0 SECS   URINALYSIS W/ RFLX MICROSCOPIC    Collection Time: 11/14/15  2:47 PM   Result Value Ref Range    Color YELLOW/STRAW      Appearance CLEAR CLEAR      Specific gravity 1.013 1.003 - 1.030      pH (UA) 6.5 5.0 - 8.0      Protein NEGATIVE  NEG mg/dL    Glucose NEGATIVE  NEG mg/dL    Ketone NEGATIVE  NEG mg/dL    Bilirubin NEGATIVE  NEG      Blood NEGATIVE  NEG      Urobilinogen 1.0 0.2 - 1.0 EU/dL    Nitrites NEGATIVE  NEG      Leukocyte Esterase SMALL (A) NEG      WBC 0-4 0 - 4 /hpf    RBC 0-5 0 - 5 /hpf    Epithelial cells FEW FEW /lpf    Bacteria NEGATIVE  NEG /hpf       IMAGING RESULTS:  CT Results  (Last 48 hours)               11/14/15 1058  CT HEAD WO CONT Final result    Impression:  IMPRESSION:        There is no acute intracranial abnormality.                   Narrative:  EXAM:  CT HEAD WO CONT       INDICATION: Frequent falls with dizziness since a fall 2 days ago. Fall again   this morning.       COMPARISON: CT head 09/22/2010       TECHNIQUE: Noncontrast head CT. Coronal and sagittal reformats. CT dose   reduction was achieved through use of a standardized protocol tailored for this   examination and automatic exposure control for dose modulation.        FINDINGS: The ventricles and sulci are age-appropriate without hydrocephalus.   There is no mass effect or midline shift. There is no intracranial hemorrhage or   extra-axial fluid collection. There is no significant white matter disease. The   gray-white matter differentiation is maintained. The basal cisterns are patent.       The osseous structures are intact. Surgical changes are again seen following   left mastoidectomy. The visualized paranasal sinuses and mastoid air cells are   clear.               Study Result    ?? INDICATION: Fall. Injury. Neck pain.  ??  COMPARISON: 05/18/2014.  ??  FINDINGS:  Five views, AP, lateral, bilateral oblique, and open-mouth odontoid were  obtained of the cervical spine.   The vertebral bodies show satisfactory height and alignment.   Narrowed discs and spurring at C5-C7 and facet arthropathy are seen.   The prevertebral soft tissues are not widened.   The odontoid appears intact.   ??  ??  IMPRESSION  IMPRESSION:   Cervical degenerative spondylosis.  ??  No compression fracture or subluxation.         MEDICATIONS GIVEN:  Medications   sodium chloride 0.9 % bolus infusion 1,000 mL (not administered)   HYDROmorphone (PF) (DILAUDID) injection 1 mg (1 mg IntraVENous Given 11/14/15 1046)   ondansetron (ZOFRAN) injection 4 mg (4 mg IntraVENous Given 11/14/15 1046)   famotidine (PF) (PEPCID) 20 mg in sodium chloride 0.9 % 10 mL injection (20 mg IntraVENous Given 11/14/15 1138)   fentaNYL citrate (PF) injection 100 mcg (100 mcg IntraVENous Given 11/14/15 1158)   ondansetron (ZOFRAN) injection 4 mg (4 mg IntraVENous Given 11/14/15 1158)   fentaNYL citrate (PF) injection 100 mcg (100 mcg IntraVENous Given 11/14/15 1358)   prochlorperazine (COMPAZINE) with saline injection 10 mg (10 mg IntraVENous Given 11/14/15 1358)       IMPRESSION:  1. CHI (closed head injury), initial encounter    2. Concussion, without loss of consciousness, initial encounter        PLAN:  1.    Current Discharge Medication List      START taking these medications    Details   oxyCODONE-acetaminophen (PERCOCET) 5-325 mg per tablet Take 1 Tab by mouth every four (4) hours as needed for Pain. Max Daily Amount: 6 Tabs.  Qty: 20 Tab, Refills: 0      ondansetron hcl (ZOFRAN, AS HYDROCHLORIDE,) 4 mg tablet Take 1 Tab by mouth every eight (8) hours as needed for Nausea.  Qty: 20 Tab, Refills: 0         CONTINUE these medications which have NOT CHANGED    Details   linaclotide (LINZESS) 145 mcg cap capsule Take  by mouth Daily (before breakfast).      hydrOXYzine (ATARAX) 25 mg tablet Take  by mouth two (2) times a day.      atomoxetine (STRATTERA) 80 mg capsule Take 1 Cap by mouth daily.  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  Qty: 270 Tab, Refills: 3    Associated Diagnoses: Benign essential tremor      ketorolac (TORADOL) 10 mg tablet Take 10 mg by mouth.      hydroxychloroquine (PLAQUENIL) 200 mg tablet Take 200 mg by mouth daily.      meloxicam (MOBIC) 15 mg tablet Take 15 mg by mouth daily.      prochlorperazine (COMPAZINE) 10 mg tablet Take 1 Tab by mouth every eight (8) hours as needed for Nausea.  Qty: 12 Tab, Refills: 0      potassium chloride SR (KLOR-CON 10) 10 mEq tablet Take 1 Tab by mouth daily.  Qty: 30 Tab, Refills: 0      gabapentin (NEURONTIN) 300 mg capsule Take 600 mg by mouth three (3) times daily.      valacyclovir (VALTREX) 1 g tablet Take 1,000 mg by mouth daily.      duloxetine (CYMBALTA) 60 mg capsule  Take 60 mg by mouth two (2) times a day.      triamterene-hydrochlorothiazide (DYAZIDE) 37.5-25 mg per capsule Take 1 Cap by mouth daily.      ergocalciferol (VITAMIN D) 50,000 unit capsule Take 50,000 Units by mouth every seven (7) days.       cyclobenzaprine (FLEXERIL) 10 mg tablet Take 10 mg by mouth two (2) times a day.      azelastine (ASTELIN) 137 mcg nasal spray 1 Spray by Nasal route two (2) times a day. Administer to right and left nostril.      prednisoLONE acetate (PRED FORTE) 1 % ophthalmic suspension Administer 1 Drop to right eye daily.         STOP taking these medications       hydrocodone-acetaminophen (LORTAB) 5-500 mg per tablet Comments:   Reason for Stopping:             2.   Follow-up Information     Follow up With Details Comments Contact Info    Meghan F Rodden, MD Call in 1 day As needed [address]  [place] [address]  [phone]      [facility]  If symptoms worsen [place], NP In 2 days As needed [place] [address]  [phone]          Return to ED if worse       DISCHARGE NOTE  3:35 PM  The patient has been re-evaluated and is ready for discharge. Reviewed available results with patient. Counseled pt on diagnosis and care plan. Pt has expressed understanding, and all questions have been answered. Pt agrees with plan and agrees to follow up as recommended, or return to the ED if their symptoms worsen. Discharge instructions have been provided and explained to the pt, along with reasons to return to the ED.    Attestations:  This note is prepared by Dondra Prader. Owens Shark, acting as Education administrator for Seward Speck, MD.    Seward Speck, MD: The scribe's documentation has been prepared under my direction and personally reviewed by me in its entirety. I confirm that the note above accurately reflects all work, treatment, procedures, and medical decision making performed by me.

## 2015-11-14 NOTE — ED Notes (Signed)
Pt rec'd discharge instructions by MD in wheelchair to exit with family and ED tech

## 2015-11-14 NOTE — ED Notes (Signed)
Pt. States she cannot have normal saline bolus infusions.  Dr. Tamala Julian aware.  Pt. In position of comfort with call bell in reach.

## 2015-11-14 NOTE — ED Notes (Signed)
Dr. Smith at bedside to update patient on plan of care.

## 2015-11-14 NOTE — ED Notes (Signed)
Pt. Requesting medication for indigestion at this time.  PT. Also still complaining of headache.  Dr. Tamala Julian aware.

## 2015-11-14 NOTE — ED Notes (Signed)
Dr. Smith at bedside to evaluate patient.

## 2015-11-14 NOTE — ED Triage Notes (Addendum)
Pt presents to ED today for complaints of frequent falls related to dizziness over the past few days. Pt. Has meniere's disease.  Pt. States she fell on Monday and fell again today. PT. Also with complaints of a migraine.  Pt. Alert and oriented x4.  Pt. Placed on monitor x3.

## 2016-02-01 NOTE — Telephone Encounter (Signed)
Patient put on the schedule with NP

## 2016-02-01 NOTE — Telephone Encounter (Signed)
-----   Message from [facility] sent at 02/01/2016 10:35 AM EDT -----  Regarding: Dr. Manfred Arch  Pt is requesting an appointment with Dr. Marcello Moores ASAP. Pt was offered Dr. Marcello Moores' next available appointment 05/09/16 @2p  and she said that will not work. Pt says that her BIL feet are numb. PT states she doesn't have diabetes.  She feels like she is walking on rubber balls and her feet are blood red. She says that it is up her legs above her ankles. And the only thing that helps is staying in the cold house with her feet propped up. Per Johnique PSR--adv pt to go to the   R and pt said that she will think on it. She doesn't want to bother the ER since she has been dealing with this for a while. Just became unbearable yesterday as she was trying to drive home. Pt can be reached at [phone].

## 2016-02-07 ENCOUNTER — Encounter: Attending: Adult Health | Primary: Family Medicine

## 2016-05-09 ENCOUNTER — Encounter: Attending: Neurology | Primary: Family Medicine

## 2016-06-05 ENCOUNTER — Encounter

## 2016-06-20 ENCOUNTER — Ambulatory Visit: Payer: MEDICARE | Primary: Family Medicine

## 2016-07-09 ENCOUNTER — Encounter: Primary: Family Medicine

## 2016-07-09 ENCOUNTER — Inpatient Hospital Stay: Admit: 2016-07-09 | Payer: MEDICARE | Primary: Family Medicine

## 2016-07-09 DIAGNOSIS — Z1231 Encounter for screening mammogram for malignant neoplasm of breast: Secondary | ICD-10-CM

## 2016-09-04 ENCOUNTER — Ambulatory Visit: Admit: 2016-09-04 | Discharge: 2016-09-04 | Payer: MEDICARE | Attending: Surgery | Primary: Family Medicine

## 2016-09-04 ENCOUNTER — Encounter

## 2016-09-04 ENCOUNTER — Ambulatory Visit: Attending: Surgery | Primary: Family Medicine

## 2016-09-04 DIAGNOSIS — M79671 Pain in right foot: Secondary | ICD-10-CM

## 2016-09-04 NOTE — Progress Notes (Signed)
Chief Complaint   Patient presents with   ??? Numbness     Patient stated that she is having numbness and pain on both feet.      1. Have you been to the ER, urgent care clinic since your last visit?  Hospitalized since your last visit? No     2. Have you seen or consulted any other health care providers outside of the [facility] since your last visit?  Include any pap smears or colon screening.  No        Visit Vitals   ??? BP 120/80 (BP 1 Location: Left arm, BP Patient Position: Sitting)   ??? Pulse 83   ??? Temp 98.8 ??F (37.1 ??C) (Oral)   ??? Resp 16   ??? Ht 5\' 7"  (1.702 m)   ??? Wt 204 lb 12.8 oz (92.9 kg)   ??? SpO2 100%   ??? BMI 32.08 kg/m2

## 2016-09-04 NOTE — Progress Notes (Signed)
Dictation on: 09/04/2016  1:32 PM by: Jeanie Cooks [phone]     Total Evaluation and Management time utilized (excluding any procedure time)  was 50 minutes, with 70 % in counseling and/or coordination of care.    Linzie Collin, MD

## 2016-09-12 ENCOUNTER — Ambulatory Visit: Payer: MEDICARE | Primary: Family Medicine

## 2016-09-17 ENCOUNTER — Inpatient Hospital Stay: Payer: MEDICARE | Attending: Surgery | Primary: Family Medicine

## 2016-09-17 ENCOUNTER — Ambulatory Visit: Payer: MEDICARE | Primary: Family Medicine

## 2016-09-22 ENCOUNTER — Ambulatory Visit: Payer: MEDICARE | Primary: Family Medicine

## 2016-10-07 ENCOUNTER — Ambulatory Visit: Payer: MEDICARE | Primary: Family Medicine

## 2016-10-10 ENCOUNTER — Ambulatory Visit: Payer: MEDICARE | Primary: Family Medicine

## 2016-11-05 ENCOUNTER — Inpatient Hospital Stay: Admit: 2016-11-05 | Payer: MEDICARE | Attending: Surgery | Primary: Family Medicine

## 2016-11-05 DIAGNOSIS — M79672 Pain in left foot: Secondary | ICD-10-CM

## 2016-11-05 NOTE — Procedures (Signed)
[facility]  *** FINAL REPORT ***    Name: Katherine [name], Katherine [name]  MRN: [id number]    Outpatient  DOB: 05/22/1956  HIS Order #: [id number]  TRAKnet Visit #: [id number]  Date: 05 Nov 2016    TYPE OF TEST: Peripheral Arterial Testing    REASON FOR TEST  Bilateral foot pain    Right Leg  Segmentals: Normal                     mmHg  Brachial         119  High thigh       185  Low thigh        170  Calf             142  Posterior tibial 143  Dorsalis pedis   149  Peroneal  Metatarsal  Toe pressure     104  Doppler:    Normal  Ankle/Brachial: 1.22    Left Leg  Segmentals: Normal                     mmHg  Brachial         122  High thigh       176  Low thigh        179  Calf             147  Posterior tibial 140  Dorsalis pedis   146  Peroneal  Metatarsal  Toe pressure      97  Doppler:    Normal  Ankle/Brachial: 1.20    INTERPRETATION/FINDINGS  PROCEDURE:  Multi-level lower extremity arterial segmental pressures,  CW Doppler waveforms and digital PPG waveforms were performed.    1. No evidence of significant peripheral arterial disease at rest in  the right leg.  2. No evidence of significant peripheral arterial disease at rest in  the left leg.  3. The right ankle/brachial index is 1.22 and the left ankle/brachial  index is 1.20.  4. The right great toe/brachial index is 0.85 and the left great  toe/brachial index is 0.80.    ADDITIONAL COMMENTS    I have personally reviewed the data relevant to the interpretation of  this  study.    TECHNOLOGIST: Daryll Drown, RVT  Signed: 11/05/2016 11:11 AM    PHYSICIAN: Jamal Collin. Ernestina Patches, MD  Signed: 11/05/2016 12:48 PM

## 2016-11-05 NOTE — Procedures (Signed)
[facility]  *** FINAL REPORT ***    Name: Katherine [name], Katherine [name]  MRN: [id number]    Outpatient  DOB: 04/02/1956  HIS Order #: [id number]  TRAKnet Visit #: [id number]  Date: 05 Nov 2016    TYPE OF TEST: Peripheral Arterial Testing    REASON FOR TEST  Bilateral foot pain    Right Leg  Segmentals: Normal                     mmHg  Brachial         119  High thigh       185  Low thigh        170  Calf             142  Posterior tibial 143  Dorsalis pedis   149  Peroneal  Metatarsal  Toe pressure     104  Doppler:    Normal  Ankle/Brachial: 1.22    Left Leg  Segmentals: Normal                     mmHg  Brachial         122  High thigh       176  Low thigh        179  Calf             147  Posterior tibial 140  Dorsalis pedis   146  Peroneal  Metatarsal  Toe pressure      97  Doppler:    Normal  Ankle/Brachial: 1.20    INTERPRETATION/FINDINGS  PROCEDURE:  Multi-level lower extremity arterial segmental pressures,  CW Doppler waveforms and digital PPG waveforms were performed.    1. No evidence of significant peripheral arterial disease at rest in  the right leg.  2. No evidence of significant peripheral arterial disease at rest in  the left leg.  3. The right ankle/brachial index is 1.22 and the left ankle/brachial  index is 1.20.  4. The right great toe/brachial index is 0.85 and the left great  toe/brachial index is 0.80.    ADDITIONAL COMMENTS    I have personally reviewed the data relevant to the interpretation of  this  study.    TECHNOLOGIST: Daryll Drown, RVT  Signed: 11/05/2016 11:11 AM    PHYSICIAN: Jamal Collin. Ernestina Patches, MD  Signed: 11/05/2016 12:48 PM

## 2016-11-07 ENCOUNTER — Inpatient Hospital Stay: Admit: 2016-11-07 | Payer: MEDICARE | Attending: Surgery | Primary: Family Medicine

## 2016-11-07 DIAGNOSIS — R600 Localized edema: Secondary | ICD-10-CM

## 2016-11-07 NOTE — Progress Notes (Signed)
[facility] Vascular Lab Technologist Preliminary Report:  Venous Duplex Leg    Bilateral leg venous duplex was performed.    All deep and superficial veins appear compressible with normal Doppler characteristics.    No reflux visualized.      Final report to follow.

## 2016-11-07 NOTE — Procedures (Signed)
[facility]  *** FINAL REPORT ***    Name: Katherine [name], Katherine [name]  MRN: [id number]    Outpatient  DOB: [birth year]  HIS Order #: [id number]  TRAKnet Visit #: [id number]  Date: 07 Nov 2016    TYPE OF TEST: Peripheral Venous Testing    REASON FOR TEST  Edema    Right Leg:-  Deep venous thrombosis:           No  Superficial venous thrombosis:    No  Deep venous insufficiency:        No  Superficial venous insufficiency: No    Left Leg:-  Deep venous thrombosis:           No  Superficial venous thrombosis:    No  Deep venous insufficiency:        No  Superficial venous insufficiency: No      INTERPRETATION/FINDINGS  PROCEDURE:  Venous duplex examination using B-mode, color flow and  spectral Doppler of the lower extremity veins.    Right leg :  1. Deep vein(s) visualized include the common femoral, proximal  femoral, mid femoral, distal femoral, popliteal(above knee),  popliteal(fossa), popliteal(below knee), posterior tibial and peroneal   veins.  2. No evidence of deep venous thrombosis detected in the veins  visualized.  3. Superficial vein(s) visualized include the great saphenous vein.  4. No evidence of superficial thrombosis detected.  5. No evidence of reflux detected in the deep veins visualized.  6. No evidence of reflux detected in the superficial veins visualized.  Left leg :  1. Deep vein(s) visualized include the common femoral, proximal  femoral, mid femoral, distal femoral, popliteal(above knee),  popliteal(fossa), popliteal(below knee), posterior tibial and peroneal   veins.  2. No evidence of deep venous thrombosis detected in the veins  visualized.  3. Superficial vein(s) visualized include the great saphenous vein.  4. No evidence of superficial thrombosis detected.  5. No evidence of reflux detected in the deep veins visualized.  6. No evidence of reflux detected in the superficial veins visualized.    ADDITIONAL COMMENTS     I have personally reviewed the data relevant to the interpretation of  this  study.    TECHNOLOGIST: Kathie Dike. Perrino, RVT, RDMS  Signed: 11/07/2016 01:49 PM    PHYSICIAN: Iona Beard B. Ernestina Patches, MD  Signed: 11/17/2016 03:17 PM

## 2016-11-07 NOTE — Telephone Encounter (Signed)
Surgery    Left message for patient to call me regarding vascula lab results.    Linzie Collin, MD

## 2016-11-10 NOTE — Telephone Encounter (Signed)
Patient returning call, regarding test results.

## 2016-11-10 NOTE — Telephone Encounter (Signed)
Spoke with Katherine [name].  Dr Ernestina Patches out of the office until Friday.  Will advise when he returns pt has called regarding results of both vascular studies.

## 2016-11-17 NOTE — Procedures (Signed)
[facility]  *** FINAL REPORT ***    Name: Katherine [name], Katherine [name]  MRN: [id number]    Outpatient  DOB: 08/09/1956  HIS Order #: [id number]  TRAKnet Visit #: [id number]  Date: 07 Nov 2016    TYPE OF TEST: Peripheral Venous Testing    REASON FOR TEST  Edema    Right Leg:-  Deep venous thrombosis:           No  Superficial venous thrombosis:    No  Deep venous insufficiency:        No  Superficial venous insufficiency: No    Left Leg:-  Deep venous thrombosis:           No  Superficial venous thrombosis:    No  Deep venous insufficiency:        No  Superficial venous insufficiency: No      INTERPRETATION/FINDINGS  PROCEDURE:  Venous duplex examination using B-mode, color flow and  spectral Doppler of the lower extremity veins.    Right leg :  1. Deep vein(s) visualized include the common femoral, proximal  femoral, mid femoral, distal femoral, popliteal(above knee),  popliteal(fossa), popliteal(below knee), posterior tibial and peroneal   veins.  2. No evidence of deep venous thrombosis detected in the veins  visualized.  3. Superficial vein(s) visualized include the great saphenous vein.  4. No evidence of superficial thrombosis detected.  5. No evidence of reflux detected in the deep veins visualized.  6. No evidence of reflux detected in the superficial veins visualized.  Left leg :  1. Deep vein(s) visualized include the common femoral, proximal  femoral, mid femoral, distal femoral, popliteal(above knee),  popliteal(fossa), popliteal(below knee), posterior tibial and peroneal   veins.  2. No evidence of deep venous thrombosis detected in the veins  visualized.  3. Superficial vein(s) visualized include the great saphenous vein.  4. No evidence of superficial thrombosis detected.  5. No evidence of reflux detected in the deep veins visualized.  6. No evidence of reflux detected in the superficial veins visualized.    ADDITIONAL COMMENTS    I have personally reviewed the data relevant to the  interpretation of  this  study.    TECHNOLOGIST: Kathie Dike. Perrino, RVT, RDMS  Signed: 11/07/2016 01:49 PM    PHYSICIAN: Iona Beard B. Ernestina Patches, MD  Signed: 11/17/2016 03:17 PM

## 2016-11-20 NOTE — Telephone Encounter (Signed)
Surgery    I spoke to the patient regarding the findings on vascular testing.      Venous duplex ultrasound did not suggest obstruction or valvular insufficiency in the deep or superficial veins of the legs.    Non invasive arterial testing showed normal flow in the legs into the feet and toes bilaterally.    I would recommend evaluation by a neurologist regarding peripheral neuropathy or other nerve pathology.      I would also recommend evaluation by a rheumatologist for any contribution of her lupus to her symptoms.    Linzie Collin, MD

## 2016-11-20 NOTE — Telephone Encounter (Signed)
Dr Ernestina Patches spoke with Ms Katherine [name].

## 2016-11-21 NOTE — Progress Notes (Signed)
Per Dr Ernestina Patches, faxed telephone encounter dated 11/20/16 to Johny Blamer, NP at (f) [phone], confirmation received.

## 2017-02-16 ENCOUNTER — Encounter: Attending: Adult Health | Primary: Family Medicine

## 2017-03-04 ENCOUNTER — Ambulatory Visit: Attending: Adult Health | Primary: Family Medicine

## 2017-03-04 MED ORDER — GABAPENTIN 300 MG CAP
300 mg | ORAL_CAPSULE | Freq: Three times a day (TID) | ORAL | 5 refills | Status: DC
Start: 2017-03-04 — End: 2019-12-08

## 2017-03-04 NOTE — Progress Notes (Signed)
Date:             March 04, 2017    Name:  Katherine [name]  DOB:  20-Nov-1955  MRN:  [id number]     PCP:  Danella Penton, MD    Chief Complaint   Patient presents with   ??? Neurologic Problem     f/u cognitive deficits and neuropathy   ??? Foot Pain   ??? Numbness         HISTORY OF PRESENT ILLNESS:  Katherine [name] is a 61 y.o., female who presents today for follow up for trigeminal neuralgia, neuropathy. She stopped taking Tegretol since she was last seen and she is not sure that it ever helped. She is on gabapentin for postherpetic neuralgia, had shingles in her eye and ear 13 years ago. She takes 300 mg gabapentin tid, wonders if a higher dose would help with her current pain in her legs. She reports that she has had a major flare of her fibromyalgia, saw a rheumatologist at [facility] and will see her again in September. She has been in a lot pf pain, has started taking hydrocodone which she has not taken in forever. Pain is all over her body, her neck, her arms. Pain is aching. She got a steroid injection in her knee yesterday because she fell and hit her knee, she sees Dr. Eugenie Birks for arthritis in her knees. 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 Her PCP sent her here because her legs are red and swollen, she saw Dr. Ernestina Patches who did  vascular imaging and arteries/veins are fine and he sent her to rheumatology then to [place]. Her feet feel like rubber balls, they tingle and turn blood red and sometimes burning hot. This started in November, her feet feel hot to the touch at night or if she is out in the heat. She does have a h/o surgery x 2 on her back, back pain is getting worse. When she walks she hears a click in her back. She had epidural injections in the past, but had CSF leak after the last one and had to get a blood patch. She does not want injections again. She uses conservative pain management techniques, patches, heat to help control pain.     8.10.2015 recap   Katherine [name] presents today for follow up on head pain, TGN, and medication management. Patient last seen 12/2013 with referral to Psychiatry provided for a second time for medication management of ADHD, depression, anxiety, etc. Patient has not been to Psychiatry as referred stating she did not remember. Patient does not feel that Christianne Borrow is helping her - discussed with patient that she needs to follow with Psychiatry or PCP for management of this and other psychiatric concerns as noted in Neuro Psych testing results.  Discussed that we can manage her neurologic conditions but not Psychologic condition.  Patient reports that the increase to Tegretol XR 200 mg BID caused constipation with eventual chest pain - evaluated by Dr. Berline Lopes, Cardiology with no abnormalities noted.  Patient was then seen by GI with addition of medications to help with motility with a scheduled colonoscopy. Patient states head hurts all the time but vague as to location, duration, description.  Patient states she hurts all over so hard for her to describe just her head.  Patient states she shakes all the time - though not during today's visit or during last 2 visits, she states it occurs all the time.  Current Outpatient Prescriptions   Medication Sig   ??? HYDROcodone-acetaminophen (NORCO) 10-325 mg tablet    ??? butalbital-acetaminophen-caffeine (FIORICET, ESGIC) 50-325-40 mg per tablet    ??? raNITIdine (ZANTAC) 150 mg tablet TAKE 1 TABLET BY MOUTH TWICE A DAY   ??? topiramate (TOPAMAX) 200 mg tablet    ??? LORazepam (ATIVAN) 2 mg tablet Take 1 mg by mouth every six (6) hours as needed for Anxiety.   ??? linaclotide (LINZESS) 145 mcg cap capsule Take  by mouth Daily (before breakfast).   ??? primidone (MYSOLINE) 50 mg tablet Take 1 Tab by mouth three (3) times daily.   ??? hydroxychloroquine (PLAQUENIL) 200 mg tablet Take 200 mg by mouth daily.   ??? meloxicam (MOBIC) 15 mg tablet Take 15 mg by mouth daily.    ??? potassium chloride SR (KLOR-CON 10) 10 mEq tablet Take 1 Tab by mouth daily.   ??? gabapentin (NEURONTIN) 300 mg capsule Take 300 mg by mouth three (3) times daily.   ??? valacyclovir (VALTREX) 1 g tablet Take 1,000 mg by mouth daily.   ??? duloxetine (CYMBALTA) 60 mg capsule Take 60 mg by mouth two (2) times a day.   ??? triamterene-hydrochlorothiazide (DYAZIDE) 37.5-25 mg per capsule Take 1 Cap by mouth daily.   ??? ergocalciferol (VITAMIN D) 50,000 unit capsule Take 50,000 Units by mouth every seven (7) days.   ??? cyclobenzaprine (FLEXERIL) 10 mg tablet Take 10 mg by mouth two (2) times a day.   ??? azelastine (ASTELIN) 137 mcg nasal spray 1 Spray by Nasal route two (2) times a day. Administer to right and left nostril.   ??? prednisoLONE acetate (PRED FORTE) 1 % ophthalmic suspension Administer 1 Drop to right eye daily.     No current facility-administered medications for this visit.      Allergies   Allergen Reactions   ??? Morphine Other (comments)     "it does not stop the pain"   ??? Other Medication Other (comments)     Pt must have low sodium fluid because it will cause an attack of the mineres   ??? Pcn [Penicillins] Rash and Swelling     Past Medical History:   Diagnosis Date   ??? Arthritis    ??? Autoimmune disease ([place])    ??? Bowel disease    ??? Burning with urination    ??? Chronic pain    ??? Depression    ??? Diarrhea    ??? Difficult intubation     difficulty breathing after awakening in past.   ??? Dizziness    ??? Easy bruising    ??? Headache(784.0)    ??? Hearing loss    ??? Night sweats    ??? Numbness    ??? Other ill-defined conditions(799.89)     shingles, lupus, menieres, Fibromyalgia   ??? Sweats, menopausal    ??? Visual loss      Past Surgical History:   Procedure Laterality Date   ??? ABDOMEN SURGERY PROC UNLISTED  1994    1 foot of colon removed   ??? HX APPENDECTOMY     ??? HX BREAST BIOPSY Left     benign core bx   ??? HX CHOLECYSTECTOMY     ??? HX GYN      hysterectomy   ??? HX HEENT       right eye corona transplant, both ears, tonsillectomy   ??? HX HERNIA REPAIR     ??? HX ORTHOPAEDIC      back x2, right knee   ???  HX ORTHOPAEDIC  01-2014     right hand surgery   ??? HX TRANSPLANT      right corona   ??? HX UROLOGICAL      bladder surgery x2     Social History     Social History   ??? Marital status: WIDOWED     Spouse name: N/A   ??? Number of children: N/A   ??? Years of education: N/A     Occupational History   ??? Not on file.     Social History Main Topics   ??? Smoking status: Current Every Day Smoker     Packs/day: 1.00     Years: 30.00     Last attempt to quit: 11/16/2013   ??? Smokeless tobacco: Never Used   ??? Alcohol use No   ??? Drug use: No   ??? Sexual activity: Not on file     Other Topics Concern   ??? Not on file     Social History Narrative     Family History   Problem Relation Age of Onset   ??? Heart Disease Mother    ??? Cancer Mother      breast cancer   ??? Breast Cancer Mother 31   ??? Hypertension Father    ??? Diabetes Father    ??? Heart Disease Sister          PHYSICAL EXAMINATION:    Visit Vitals   ??? BP 140/82   ??? Pulse 95   ??? Resp 18   ??? Ht 5\' 7"  (1.702 m)   ??? Wt 94.8 kg (209 lb)   ??? SpO2 95%   ??? BMI 32.73 kg/m2     General:  Well defined, nourished, and groomed individual in no acute distress.    Neck: Supple, nontender, no bruits, no pain with resistance to active range of motion.    Heart: Regular rate and rhythm, no murmurs, rub, or gallop.  Normal S1S2.  Lungs:  Clear to auscultation bilaterally with equal chest expansion, no cough, no wheeze  Musculoskeletal:  Extremities revealed no edema and had full range of motion of joints.    Psych:  Good mood and bright affect    NEUROLOGICAL EXAMINATION:     Mental Status:   Alert and oriented to person, place, and time with recent and remote memory intact.  Attention span and concentration are normal. Speech is fluent with a full fund of knowledge.      Cranial Nerves:    II, III, IV, VI:  Visual acuity grossly intact.    Pupils are equal, round, and reactive to light.    Extra-ocular movements are full and fluid.  No ptosis or nystagmus.   V-XII: Hearing is grossly intact.  Facial features are symmetric, with normal sensation and strength.  The palate rises symmetrically and the tongue protrudes midline.  Sternocleidomastoids 5/5.      Motor Examination: Normal tone, bulk, and strength, 5/5 muscle strength throughout.    Sensory: Diminished to temp and vibration in bilateral feet  Coordination:  Finger to nose testing was normal.   No resting or intention tremor  Gait and Station:  Steady while walking.  Normal arm swing.  No pronator drift.   No muscle wasting or fasciculations noted.    DTRs 1+ and equal throughout    ASSESSMENT AND PLAN    ICD-10-CM ICD-9-CM    1. 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 Lumbar radiculopathy M54.16 724.4 gabapentin (NEURONTIN) 300 mg capsule   3. Lumbar post-laminectomy syndrome M96.1 722.83 gabapentin (NEURONTIN) 300 mg capsule   4. Burning sensation of feet R20.8 782.0 gabapentin (NEURONTIN) 300 mg capsule   5. Chronic bilateral low back pain without sciatica M54.5 724.2     G89.29 47.74      61 year old female seen in follow-up for above.  She complains of worsening burning pain in her feet, numbness in her feet, low back pain.  Her feet are red and swollen, hot to the touch but she has had vascular workup that was negative.  She is on gabapentin 300 mg 3 times daily, she would like to increase that.    1.  Increase gabapentin to 600 mg 3 times daily for neuropathic pain  2.  EMG/NCS of bilateral lower extremity, evaluate for neuropathy, radiculopathy  3.  We will repeat MRI, may need to re-establish with pain management    Follow-up after EMG    Shearon Stalls NP    This note was created using voice recognition software. Despite editing, there may be syntax errors.

## 2017-03-04 NOTE — Progress Notes (Signed)
Patient here for follow up on cognitive deficits and neuropathy. Patient reported her main reason for follow up today is several issues with her feet.

## 2017-03-04 NOTE — Patient Instructions (Addendum)
Increase gabapentin to two 300 mg caps 3 times a day      [facility]   Statement to Patients  November 23, 2012      In an effort to ensure the large volume of patient prescription refills is processed in the most efficient and expeditious manner, we are asking our patients to assist [place] by calling your Pharmacy for all prescription refills, this will include also your  Mail Order Pharmacy. The pharmacy will contact our office electronically to continue the refill process.    Please do not wait until the last minute to call your pharmacy. We need at least 48 hours (2days) to fill prescriptions. We also encourage you to call your pharmacy before going to pick up your prescription to make sure it is ready.     With regard to controlled substance prescription refill requests (narcotic refills) that need to be picked up at our office, we ask your cooperation by providing [place] with at least 72 hours (3days) notice that you will need a refill.    We will not refill narcotic prescription refill requests after 4:00pm on any weekday, Monday through Thursday, or after 2:00pm on Fridays, or on the weekends.      We encourage everyone to explore another way of getting your prescription refill request processed using MyChart, our patient web portal through our electronic medical record system. MyChart is an efficient and effective way to communicate your medication request directly to the office and  downloadable as an app on your smart phone . MyChart also features a review functionality that allows you to view your medication list as well as leave messages for your physician. Are you ready to get connected? If so please review the attatched instructions or speak to any of our staff to get you set up right away!    Thank you so much for your cooperation. Should you have any questions please contact our Engineer, building services.    The Physicians and Staff,  [facility]                 A Healthy Lifestyle: Care Instructions  Your Care Instructions    A healthy lifestyle can help you feel good, stay at a healthy weight, and have plenty of energy for both work and play. A healthy lifestyle is something you can share with your whole family.  A healthy lifestyle also can lower your risk for serious health problems, such as high blood pressure, heart disease, and diabetes.  You can follow a few steps listed below to improve your health and the health of your family.  Follow-up care is a key part of your treatment and safety. Be sure to make and go to all appointments, and call your doctor if you are having problems. It's also a good idea to know your test results and keep a list of the medicines you take.  How can you care for yourself at home?  ?? Do not eat too much sugar, fat, or fast foods. You can still have dessert and treats now and then. The goal is moderation.  ?? Start small to improve your eating habits. Pay attention to portion sizes, drink less juice and soda pop, and eat more fruits and vegetables.  ?? Eat a healthy amount of food. A 3-ounce serving of meat, for example, is about the size of a deck of cards. Fill the rest of your plate with vegetables  and whole grains.  ?? Limit the amount of soda and sports drinks you have every day. Drink more water when you are thirsty.  ?? Eat at least 5 servings of fruits and vegetables every day. It may seem like a lot, but it is not hard to reach this goal. A serving or helping is 1 piece of fruit, 1 cup of vegetables, or 2 cups of leafy, raw vegetables. Have an apple or some carrot sticks as an afternoon snack instead of a candy bar. Try to have fruits and/or vegetables at every meal.  ?? Make exercise part of your daily routine. You may want to start with simple activities, such as walking, bicycling, or slow swimming. Try to be active 30 to 60 minutes every day. You do not need to do all 30 to 60  minutes all at once. For example, you can exercise 3 times a day for 10 or 20 minutes. Moderate exercise is safe for most people, but it is always a good idea to talk to your doctor before starting an exercise program.  ?? Keep moving. Mow the lawn, work in the garden, or [employer]. Take the stairs instead of the elevator at work.  ?? If you smoke, quit. People who smoke have an increased risk for heart attack, stroke, cancer, and other lung illnesses. Quitting is hard, but there are ways to boost your chance of quitting tobacco for good.  ?? Use nicotine gum, patches, or lozenges.  ?? Ask your doctor about stop-smoking programs and medicines.  ?? Keep trying.  In addition to reducing your risk of diseases in the future, you will notice some benefits soon after you stop using tobacco. If you have shortness of breath or asthma symptoms, they will likely get better within a few weeks after you quit.  ?? Limit how much alcohol you drink. Moderate amounts of alcohol (up to 2 drinks a day for men, 1 drink a day for women) are okay. But drinking too much can lead to liver problems, high blood pressure, and other health problems.  Family health  If you have a family, there are many things you can do together to improve your health.  ?? Eat meals together as a family as often as possible.  ?? Eat healthy foods. This includes fruits, vegetables, lean meats and dairy, and whole grains.  ?? Include your family in your fitness plan. Most people think of activities such as jogging or tennis as the way to fitness, but there are many ways you and your family can be more active. Anything that makes you breathe hard and gets your heart pumping is exercise. Here are some tips:  ?? Walk to do errands or to take your child to school or the bus.  ?? Go for a family bike ride after dinner instead of watching TV.  Where can you learn more?  Go to [employer].   Enter (831)116-0356 in the search box to learn more about "A Healthy Lifestyle: Care Instructions."  Current as of: July 31, 2016  Content Version: 11.7  ?? 2006-2018 [employer]. Care instructions adapted under license by [employer] (which disclaims liability or warranty for this information). If you have questions about a medical condition or this instruction, always ask your healthcare professional. Conrad any warranty or liability for your use of this information.

## 2017-03-25 ENCOUNTER — Ambulatory Visit: Admit: 2017-03-25 | Payer: MEDICARE | Attending: Neurology | Primary: Family Medicine

## 2017-03-25 DIAGNOSIS — G608 Other hereditary and idiopathic neuropathies: Secondary | ICD-10-CM

## 2017-03-25 NOTE — Procedures (Signed)
Procedures  by Shaune Pollack, MD at 03/25/17 1500                Author: Shaune Pollack, MD  Service: --  Author Type: Physician       Filed: 03/25/17 2136  Encounter Date: 03/25/2017  Status: Addendum          Editor: Shaune Pollack, MD (Physician)          Related Notes: Original Note by Shaune Pollack, MD (Physician) filed at 03/25/17 1633            Procedures        1. EMG TWO EXTREMITIES LOWER [[id number] (Custom)]        2. EMG TWO EXTREMITIES UPPER [[id number] (Custom)]                              [facility]  at [address] [place] [address]   Tel [phone]     Fax [phone]   Electrodiagnostic Study Report   Test Date:  03/25/2017             Patient:  Katherine [name]  DOB:  1956/03/19  Physician:  Danton Sewer, MD            Sex:  Female    <  Ref Phys:  Danton Sewer, M.D.        Technician: Isla Pence      Clinical indication: Patient is 61 year old female with complaints of weakness and numbness in both arms and legs, and neck and back pain,, for EMG study to rule out neuropathy, rule out radiculopathy, without entrapment neuropathy, rule out other neuromuscular  disease.  Patient does not have a history of diabetes.  Patient had previous lumbar surgery for back pain and lumbar radiculopathy.  EMG study to find because of weakness and sensory loss in her legs and rule out neuropathy.   Neuro exam: Patient shows hypoactive but symmetric reflexes throughout.  Patient has normal muscle bulk and strength in all extremities though she gives way functionally and most of her muscles.  Patient has no fasciculations noted, and no Babinski or  clonus present.  Patient with decreased sensation to temperature and vibration in both feet, but cranial nerves II through XII seem intact.  Patient moves slowly because of arthritis, and joint pain, and needs a knee replacement on the right.  Patient  very depressed and anxious.       Impression: This study shows diffuse decreased effort throughout the exam in all muscles tested, with variable giving way weakness  due to the pain in the test, and her complaints of weakness, somewhat compromising the test overall.  However overall it was interpretable with this caveat due to her poor effort diffusely throughout the test.  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  This is manifest by the slowing of the nerve conductions proximally around the elbow on proximal stimulation, but no significant  denervation changes or axonal changes were seen in the ulnar innervated muscles in the hand.   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   3.)  There was no clear evidence of a sensorimotor neuropathy on this exam, but the unobtainable sensory conductions in the lower extremities, and is slightly low amplitude of some of the distal motor and sensory latencies, could possibly be consistent  with an early neuropathy, even though the same changes can be seen in people of large size and elderly patients.   There was no clear evidence of cervical or motor radiculopathy on the basis of this exam.  There was no clear evidence of any muscle disease or myopathy on the basis of this exam.   Clinical correlation recommended, and follow-up studies in 6-12 months time patient found to be of further diagnostic benefit, and correlation with metabolic studies would also be of further diagnostic benefit possibly.   Study was somewhat limited by the patient's poor and consistent decreased effort throughout the  test.   Difficult case         EMG & NCV Findings:   Evaluation of the Left Fibular motor, the Right Fibular motor, and the Right Sup Fibular sensory nerves showed reduced amplitude (L1.0, R0.9, R3.6 ??V).  The Left median motor and the Left ulnar motor nerves showed prolonged distal onset latency (L4.6,  L3.2 ms).  The Right ulnar motor nerve showed decreased conduction velocity (A Elbow-B Elbow, 43 m/s).  The Left median sensory nerve showed prolonged distal peak latency (4.3 ms).  The Left sural sensory and the Right sural sensory nerves showed no response  (Calf).  All remaining nerves (as indicated in the following tables) were within normal limits.        F Wave studies indicate that the Right tibial F wave has prolonged latency (57.12 ms).  All remaining F Wave latencies were within normal limits.           Nerve Conduction Studies   Anti Sensory Summary Table                 Site  NR  Peak (ms)  Norm Peak (ms)  P-T Amp (??V)  Norm O-P Amp  Site1  Site2  Dist (cm)       Left Median Anti Sensory (2nd Digit)  30.6??C               Wrist      4.3  <4  47.2  >13  Wrist  2nd Digit  14.0       Right Median Anti Sensory (2nd Digit)  32.1??C               Wrist      3.5  <4  42.9  >13  Wrist  2nd Digit  14.0       Right Radial Anti Sensory (Base 1st Digit)  32.1??C               Wrist      2.7  <2.8  21.3  >11  Wrist  Base 1st Digit  10.0       Left Sup Fibular Anti Sensory (Lat ankle)  32.7??C               Lower leg      2.8  <4.6  2.9  >4  Lower leg  Lat ankle  10.0  Right Sup Fibular Anti Sensory (Lat ankle)  32.3??C               Lower leg      2.7  <4.6  5.3  >4  Lower leg  Lat ankle  10.0     Site 2      2.8    4.4               Left Sural Anti Sensory (Lat Mall)  32.8??C               Calf  NR    <4.5    >4.0  Calf  Lat Mall  14.0       Right Sural Anti Sensory (Lat Mall)  33??C               Calf  NR    <4.5    >4.0  Calf  Lat Mall  14.0       Left Ulnar Anti Sensory (5th Digit)  30.6??C               Wrist       3.9  <4.0  49.7  >9  Wrist  5th Digit  14.0       Right Ulnar Anti Sensory (5th Digit)  32??C               Wrist      3.7  <4.0  39.2  >9  Wrist  5th Digit  14.0        Motor Summary Table                   Site  NR  Onset (ms)  Norm Onset (ms)  O-P Amp (mV)  Norm O-P Amp  P-T Amp (mV)  Site1  Site2  Dist (cm)  Vel (m/s)       Left Fibular Motor (Ext Dig Brev)  32.8??C                 Ankle      4.7  <6.5  1.0  >1.1  1.4  Ankle  Ext Dig Brev  8.0       B Fib      12.5    0.8    1.1  B Fib  Ankle  32.5  42     Poplt      14.8    0.8    1.1  Poplt  B Fib  10.0  43       Right Fibular Motor (Ext Dig Brev)  31.4??C                 Ankle      4.1  <6.5  0.9  >1.1  1.6  Ankle  Ext Dig Brev  8.0                   B Fib      11.5    0.9    1.4  B Fib  Ankle  33.0  45     Poplt      13.4    0.8    1.2  Poplt  B Fib  10.0  53       Right Fibular TA Motor (Tib Ant)  32.2??C                 Fib Head      3.8  <4.5  3.2  >3.0  7.3  Fib Head  Tib Ant  10.0       Poplit      5.4    3.3    7.3  Poplit  Fib Head  10.0  62       Left Median Motor (Abd Poll Brev)  30.6??C                 Wrist      4.6  <4.5  10.4  >4.1  19.3  Wrist  Abd Poll Brev  8.0       Elbow      9.5    9.8    17.8  Elbow  Wrist  24.5  50       Right Median Motor (Abd Poll Brev)  32.1??C                 Wrist      3.8  <4.5  8.8  >4.1  13.9  Wrist  Abd Poll Brev  8.0       Elbow      8.5    8.8    13.0  Elbow  Wrist  25.5  54       Left Tibial Motor (Abd Hall Brev)  32.7??C                 Ankle      3.9  <6.1  5.4  >1.1  7.8  Ankle  Abd Hall Brev  8.0       Knee      14.3    4.3    6.2  Knee  Ankle  43.0  41       Right Tibial Motor (Abd Hall Brev)  31.9??C                 Ankle      3.8  <6.1  3.2  >1.1  4.7  Ankle  Abd Hall Brev  8.0       Knee      14.7    1.7    2.1  Knee  Ankle  43.0  39       Left Ulnar Motor (Abd Dig Minimi)  30.6??C                 Wrist      3.2  <3.1  8.4  >7.0  13.6  Wrist  Abd Dig Minimi  8.0       B Elbow      6.6    7.4    11.9  B Elbow   Wrist  19.5  57     A Elbow      8.4    7.0    11.1  A Elbow  B Elbow  10.0  56       Right Ulnar Motor (Abd Dig Minimi)  32??C                 Wrist      3.0  <3.1  9.5  >7.0  15.9  Wrist  Abd Dig Minimi  8.0       B Elbow      6.6    9.1    15.1  B Elbow  Wrist  19.0  53                 A Elbow      8.9    8.2    14.2  A Elbow  B Elbow  10.0  43        F Wave Studies             NR  F-Lat (ms)  Lat Norm (ms)  L-R F-Lat (ms)  L-R Lat Norm       Right Tibial (Mrkrs) (Abd Hallucis)  32.1??C               57.12  <56    <5.7       Right Ulnar (Mrkrs) (Abd Dig Min)  32.1??C               30.32  <32    <2.5        EMG                    Side  Muscle  Nerve  Root  Ins Act  Fibs  Psw  Recrt  Duration  Amp  Poly  Comment                  Right  Abd Poll Brev  Median  C8-T1  Nml  Nml  Nml  Nml  Nml  Nml  Nml       Right  1stDorInt  Ulnar  C8-T1  Nml  Nml  Nml  Nml  Nml  Nml  Nml       Right  Biceps  Musculocut  C5-6  Nml  Nml  Nml  Nml  Nml  Nml  Nml       Right  Triceps  Radial  C6-7-8  Nml  Nml  Nml  Nml  Nml  Nml  Nml       Right  Deltoid  Axillary  C5-6  Nml  Nml  Nml  Nml  Nml  Nml  Nml       Right  FlexPolLong  Median (Ant Int)  C7-8  Nml  Nml  Nml  Nml  Nml  Nml  Nml       Right  FlexCarpiUln  Ulnar  C8,T1  Nml  Nml  Nml  Nml  Nml  Nml  Nml       Right  BrachioRad  Radial  C5-6  Nml  Nml  Nml  Nml  Nml  Nml  Nml       Right  ABD Dig Min  Ulnar  C8-T1  Nml  Nml  Nml  Nml  Nml  Nml  Nml       Right  Lower Cerv Parasp  Rami  C7,T1  Nml  Nml  Nml  Nml  Nml  Nml  Nml       Right  Ext Dig Brev  Dp Br Peron  L5, S1  Nml  Nml  Nml  Nml  Nml  Nml  Nml       Right  AbdHallucis  MedPlantar  S1-2  Nml  Nml  Nml  Nml  Nml  Nml  Nml       Right  AntTibialis  Dp Br Peron  L4-5  Nml  Nml  Nml  Nml  Nml  Nml  Nml       Right  MedGastroc  Tibial  S1-2  Nml  Nml  Nml  Nml  Nml  Nml  Nml       Right  VastusLat  Femoral  L2-4  Nml  Nml  Nml  Nml  Nml  Nml  Nml  Right  VastusMed  Femoral  L2-4  Nml  Nml  Nml  Nml  Nml  Nml  Nml        Right  BicepsFemL  Sciatic  L5-S2  Nml  Nml  Nml  Nml  Nml  Nml  Nml       Right  Peroneus Long      Nml  Nml  Nml  Nml  Nml  Nml  Nml       Right  Lower Lumb Parasp  Rami  L5,S1  Nml  Nml  Nml  Nml  Nml  Nml  Nml       Left  Ext Dig Brev  Dp Br Peron  L5, S1  Nml  Nml  Nml  Nml  Nml  Nml  Nml       Left  AbdHallucis  MedPlantar  S1-2  Nml  Nml  Nml  Nml  Nml  Nml  Nml       Left  AntTibialis  Dp Br Peron  L4-5  Nml  Nml  Nml  Nml  Nml  Nml  Nml       Left  MedGastroc  Tibial  S1-2  Nml  Nml  Nml  Nml  Nml  Nml  Nml       Left  VastusLat  Femoral  L2-4  Nml  Nml  Nml  Nml  Nml  Nml  Nml       Left  BicepsFemL  Sciatic  L5-S2  Nml  Nml  Nml  Nml  Nml  Nml  Nml       Left  Peroneus Long      Nml  Nml  Nml  Nml  Nml  Nml  Nml       Left  Lower Lumb Parasp  Rami  L5,S1  Nml  Nml  Nml  Nml  Nml  Nml  Nml       Left  Abd Poll Brev  Median  C8-T1  Nml  Nml  Nml  Nml  Nml  Nml  Nml       Left  1stDorInt  Ulnar  C8-T1  Nml  Nml  Nml  Nml  Nml  Nml  Nml       Left  Biceps  Musculocut  C5-6  Nml  Nml  Nml  Nml  Nml  Nml  Nml       Left  Triceps  Radial  C6-7-8  Nml  Nml  Nml  Nml  Nml  Nml  Nml       Left  Deltoid  Axillary  C5-6  Nml  Nml  Nml  Nml  Nml  Nml  Nml       Left  FlexPolLong  Median (Ant Int)  C7-8  Nml  Nml  Nml  Nml  Nml  Nml  Nml       Left  BrachioRad  Radial  C5-6  Nml  Nml  Nml  Nml  Nml  Nml  Nml       Left  ExtCarRad  Radial  C6-7  Nml  Nml  Nml  Nml  Nml  Nml  Nml                    Left  Lower Cerv Parasp  Rami  C7,T1  Nml  Nml  Nml  Nml  Nml  Nml  Nml  Waveforms:                                                                                                               __________________   Danton Sewer, M.D.

## 2017-03-25 NOTE — Progress Notes (Signed)
EMG dictated in procedure note

## 2017-03-25 NOTE — Procedures (Addendum)
[facility] at [address] [place] [address]  Tel [phone]     Fax [phone]  Electrodiagnostic Study Report  Test Date:  03/25/2017    Patient: Katherine [name] DOB: [birth year] Physician: Danton Sewer, MD   Sex: Female  < Ref Phys: Danton Sewer, M.D.     Technician: Isla Pence    Clinical indication: Patient is 61 year old female with complaints of weakness and numbness in both arms and legs, and neck and back pain,, for EMG study to rule out neuropathy, rule out radiculopathy, without entrapment neuropathy, rule out other neuromuscular disease.  Patient does not have a history of diabetes.  Patient had previous lumbar surgery for back pain and lumbar radiculopathy.  EMG study to find because of weakness and sensory loss in her legs and rule out neuropathy.  Neuro exam: Patient shows hypoactive but symmetric reflexes throughout.  Patient has normal muscle bulk and strength in all extremities though she gives way functionally and most of her muscles.  Patient has no fasciculations noted, and no Babinski or clonus present.  Patient with decreased sensation to temperature and vibration in both feet, but cranial nerves II through XII seem intact.  Patient moves slowly because of arthritis, and joint pain, and needs a knee replacement on the right.  Patient very depressed and anxious.    Impression: This study shows diffuse decreased effort throughout the exam in all muscles tested, with variable giving way weakness due to the pain in the test, and her complaints of weakness, somewhat compromising the test overall.  However overall it was interpretable with this caveat due to her poor effort diffusely throughout the test.  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  This is manifest by the  slowing of the nerve conductions proximally around the elbow on proximal stimulation, but no significant denervation changes or axonal changes were seen in the ulnar innervated muscles in the hand.  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  3.)  There was no clear evidence of a sensorimotor neuropathy on this exam, but the unobtainable sensory conductions in the lower extremities, and is slightly low amplitude of some of the distal motor and sensory latencies, could possibly be consistent with an early neuropathy, even though the same changes can be seen in people of large size and elderly patients.  There was no clear evidence of cervical or motor radiculopathy on the basis of this exam.  There was no clear evidence of any muscle disease or myopathy on the basis of this exam.  Clinical correlation recommended, and follow-up studies in 6-12 months time patient found to be of further diagnostic benefit, and correlation with metabolic studies would also be of further diagnostic benefit possibly.  Study was somewhat limited by the patient's poor and consistent decreased effort throughout the test.  Difficult case      EMG & NCV Findings:  Evaluation of the Left Fibular motor, the Right Fibular motor, and the Right Sup Fibular sensory nerves showed reduced amplitude (L1.0,  R0.9, R3.6 ??V).  The Left median motor and the Left ulnar motor nerves showed prolonged distal onset latency (L4.6, L3.2 ms).  The Right ulnar motor nerve showed decreased conduction velocity (A Elbow-B Elbow, 43 m/s).  The Left median sensory nerve showed prolonged distal peak latency (4.3 ms).  The Left sural sensory and the Right sural sensory nerves showed no  response (Calf).  All remaining nerves (as indicated in the following tables) were within normal limits.      F Wave studies indicate that the Right tibial F wave has prolonged latency (57.12 ms).  All remaining F Wave latencies were within normal limits.        Nerve Conduction Studies  Anti Sensory Summary Table     Site NR Peak (ms) Norm Peak (ms) P-T Amp (??V) Norm O-P Amp Site1 Site2 Dist (cm)   Left Median Anti Sensory (2nd Digit)  30.6??C   Wrist    4.3 <4 47.2 >13 Wrist 2nd Digit 14.0   Right Median Anti Sensory (2nd Digit)  32.1??C   Wrist    3.5 <4 42.9 >13 Wrist 2nd Digit 14.0   Right Radial Anti Sensory (Base 1st Digit)  32.1??C   Wrist    2.7 <2.8 21.3 >11 Wrist Base 1st Digit 10.0   Left Sup Fibular Anti Sensory (Lat ankle)  32.7??C   Lower leg    2.8 <4.6 2.9 >4 Lower leg Lat ankle 10.0   Right Sup Fibular Anti Sensory (Lat ankle)  32.3??C   Lower leg    2.7 <4.6 5.3 >4 Lower leg Lat ankle 10.0   Site 2    2.8  4.4       Left Sural Anti Sensory (Lat Mall)  32.8??C   Calf NR  <4.5  >4.0 Calf Lat Mall 14.0   Right Sural Anti Sensory (Lat Mall)  33??C   Calf NR  <4.5  >4.0 Calf Lat Mall 14.0   Left Ulnar Anti Sensory (5th Digit)  30.6??C   Wrist    3.9 <4.0 49.7 >9 Wrist 5th Digit 14.0   Right Ulnar Anti Sensory (5th Digit)  32??C   Wrist    3.7 <4.0 39.2 >9 Wrist 5th Digit 14.0     Motor Summary Table     Site NR Onset (ms) Norm Onset (ms) O-P Amp (mV) Norm O-P Amp P-T Amp (mV) Site1 Site2 Dist (cm) Vel (m/s)   Left Fibular Motor (Ext Dig Brev)  32.8??C   Ankle    4.7 <6.5 1.0 >1.1 1.4 Ankle Ext Dig Brev 8.0    B Fib    12.5  0.8  1.1 B Fib Ankle 32.5 42   Poplt    14.8  0.8  1.1 Poplt B Fib 10.0 43   Right Fibular Motor (Ext Dig Brev)  31.4??C   Ankle    4.1 <6.5 0.9 >1.1 1.6 Ankle Ext Dig Brev 8.0    B Fib    11.5  0.9  1.4 B Fib Ankle 33.0 45   Poplt    13.4  0.8  1.2 Poplt B Fib 10.0 53   Right Fibular TA Motor (Tib Ant)  32.2??C   Fib Head    3.8 <4.5 3.2 >3.0 7.3 Fib Head Tib Ant 10.0     Poplit    5.4  3.3  7.3 Poplit Fib Head 10.0 62   Left Median Motor (Abd Poll Brev)  30.6??C   Wrist    4.6 <4.5 10.4 >4.1 19.3 Wrist  Abd Poll Brev 8.0    Elbow    9.5  9.8  17.8 Elbow Wrist 24.5 50   Right Median Motor (Abd Poll Brev)  32.1??C   Wrist    3.8 <4.5 8.8 >4.1 13.9 Wrist Abd Poll Brev 8.0    Elbow    8.5  8.8  13.0 Elbow Wrist 25.5 54   Left Tibial Motor (Abd Hall Brev)  32.7??C   Ankle    3.9 <6.1 5.4 >1.1 7.8 Ankle Abd Hall Brev 8.0    Knee    14.3  4.3  6.2 Knee Ankle 43.0 41   Right Tibial Motor (Abd Hall Brev)  31.9??C   Ankle    3.8 <6.1 3.2 >1.1 4.7 Ankle Abd Hall Brev 8.0    Knee    14.7  1.7  2.1 Knee Ankle 43.0 39   Left Ulnar Motor (Abd Dig Minimi)  30.6??C   Wrist    3.2 <3.1 8.4 >7.0 13.6 Wrist Abd Dig Minimi 8.0    B Elbow    6.6  7.4  11.9 B Elbow Wrist 19.5 57   A Elbow    8.4  7.0  11.1 A Elbow B Elbow 10.0 56   Right Ulnar Motor (Abd Dig Minimi)  32??C   Wrist    3.0 <3.1 9.5 >7.0 15.9 Wrist Abd Dig Minimi 8.0    B Elbow    6.6  9.1  15.1 B Elbow Wrist 19.0 53   A Elbow    8.9  8.2  14.2 A Elbow B Elbow 10.0 43     F Wave Studies     NR F-Lat (ms) Lat Norm (ms) L-R F-Lat (ms) L-R Lat Norm   Right Tibial (Mrkrs) (Abd Hallucis)  32.1??C      57.12 <56  <5.7   Right Ulnar (Mrkrs) (Abd Dig Min)  32.1??C      30.32 <32  <2.5     EMG     Side Muscle Nerve Root Ins Act Fibs Psw Recrt Duration Amp Poly Comment   Right Abd Poll Brev Median C8-T1 Nml Nml Nml Nml Nml Nml Nml    Right 1stDorInt Ulnar C8-T1 Nml Nml Nml Nml Nml Nml Nml    Right Biceps Musculocut C5-6 Nml Nml Nml Nml Nml Nml Nml    Right Triceps Radial C6-7-8 Nml Nml Nml Nml Nml Nml Nml    Right Deltoid Axillary C5-6 Nml Nml Nml Nml Nml Nml Nml    Right FlexPolLong Median (Ant Int) C7-8 Nml Nml Nml Nml Nml Nml Nml    Right FlexCarpiUln Ulnar C8,T1 Nml Nml Nml Nml Nml Nml Nml    Right BrachioRad Radial C5-6 Nml Nml Nml Nml Nml Nml Nml    Right ABD Dig Min Ulnar C8-T1 Nml Nml Nml Nml Nml Nml Nml     Right Lower Cerv Parasp Rami C7,T1 Nml Nml Nml Nml Nml Nml Nml    Right Ext Dig Brev Dp Br Peron L5, S1 Nml Nml Nml Nml Nml Nml Nml    Right AbdHallucis MedPlantar S1-2 Nml Nml Nml Nml Nml Nml Nml    Right AntTibialis Dp Br Peron L4-5 Nml Nml Nml Nml Nml Nml Nml    Right MedGastroc Tibial S1-2 Nml Nml Nml Nml Nml Nml Nml    Right VastusLat Femoral L2-4 Nml Nml Nml Nml Nml Nml Nml    Right VastusMed Femoral L2-4 Nml Nml Nml Nml Nml Nml Nml    Right BicepsFemL Sciatic L5-S2 Nml Nml  Nml Nml Nml Nml Nml    Right Peroneus Long   Nml Nml Nml Nml Nml Nml Nml    Right Lower Lumb Parasp Rami L5,S1 Nml Nml Nml Nml Nml Nml Nml    Left Ext Dig Brev Dp Br Peron L5, S1 Nml Nml Nml Nml Nml Nml Nml    Left AbdHallucis MedPlantar S1-2 Nml Nml Nml Nml Nml Nml Nml    Left AntTibialis Dp Br Peron L4-5 Nml Nml Nml Nml Nml Nml Nml    Left MedGastroc Tibial S1-2 Nml Nml Nml Nml Nml Nml Nml    Left VastusLat Femoral L2-4 Nml Nml Nml Nml Nml Nml Nml    Left BicepsFemL Sciatic L5-S2 Nml Nml Nml Nml Nml Nml Nml    Left Peroneus Long   Nml Nml Nml Nml Nml Nml Nml    Left Lower Lumb Parasp Rami L5,S1 Nml Nml Nml Nml Nml Nml Nml    Left Abd Poll Brev Median C8-T1 Nml Nml Nml Nml Nml Nml Nml    Left 1stDorInt Ulnar C8-T1 Nml Nml Nml Nml Nml Nml Nml    Left Biceps Musculocut C5-6 Nml Nml Nml Nml Nml Nml Nml    Left Triceps Radial C6-7-8 Nml Nml Nml Nml Nml Nml Nml    Left Deltoid Axillary C5-6 Nml Nml Nml Nml Nml Nml Nml    Left FlexPolLong Median (Ant Int) C7-8 Nml Nml Nml Nml Nml Nml Nml    Left BrachioRad Radial C5-6 Nml Nml Nml Nml Nml Nml Nml    Left ExtCarRad Radial C6-7 Nml Nml Nml Nml Nml Nml Nml    Left Lower Cerv Parasp Rami C7,T1 Nml Nml Nml Nml Nml Nml Nml        Waveforms:                                                      __________________  Danton Sewer, M.D.

## 2017-04-07 NOTE — Telephone Encounter (Signed)
-----   Message from [facility] sent at 04/07/2017 11:44 AM EDT -----  Regarding: Dr. Cristopher Peru from [place] [phone] says that Dr. Dorathy Kinsman needs the pt recent notes, test, and labs from the last visit faxed [phone].

## 2017-04-08 NOTE — Telephone Encounter (Signed)
faxed

## 2017-04-20 ENCOUNTER — Inpatient Hospital Stay: Admit: 2017-04-20 | Payer: MEDICARE | Primary: Family Medicine

## 2017-04-20 DIAGNOSIS — Z01818 Encounter for other preprocedural examination: Secondary | ICD-10-CM

## 2017-04-20 LAB — URINALYSIS W/ REFLEX CULTURE
Bacteria: NEGATIVE /hpf
Bilirubin: NEGATIVE
Blood: NEGATIVE
Glucose: NEGATIVE mg/dL
Ketone: NEGATIVE mg/dL
Nitrites: NEGATIVE
Protein: NEGATIVE mg/dL
Specific gravity: 1.011 (ref 1.003–1.030)
Urobilinogen: 0.2 EU/dL (ref 0.2–1.0)
pH (UA): 5 (ref 5.0–8.0)

## 2017-04-20 LAB — EKG, 12 LEAD, INITIAL
Atrial Rate: 81 {beats}/min
Calculated P Axis: 38 degrees
Calculated R Axis: 20 degrees
Calculated T Axis: 35 degrees
Diagnosis: NORMAL
P-R Interval: 168 ms
Q-T Interval: 392 ms
QRS Duration: 90 ms
QTC Calculation (Bezet): 455 ms
Ventricular Rate: 81 {beats}/min

## 2017-04-20 LAB — PROTHROMBIN TIME + INR
INR: 1 (ref 0.9–1.1)
Prothrombin time: 10.1 s (ref 9.0–11.1)

## 2017-04-20 LAB — METABOLIC PANEL, COMPREHENSIVE
A-G Ratio: 1 — ABNORMAL LOW (ref 1.1–2.2)
ALT (SGPT): 20 U/L (ref 12–78)
AST (SGOT): 41 U/L — ABNORMAL HIGH (ref 15–37)
Albumin: 3.8 g/dL (ref 3.5–5.0)
Alk. phosphatase: 69 U/L (ref 45–117)
Anion gap: 8 mmol/L (ref 5–15)
BUN/Creatinine ratio: 18 (ref 12–20)
BUN: 14 MG/DL (ref 6–20)
Bilirubin, total: 0.4 MG/DL (ref 0.2–1.0)
CO2: 26 mmol/L (ref 21–32)
Calcium: 8.5 MG/DL (ref 8.5–10.1)
Chloride: 103 mmol/L (ref 97–108)
Creatinine: 0.79 MG/DL (ref 0.55–1.02)
GFR est AA: >60 mL/min/{1.73_m2} (ref >60)
GFR est non-AA: >60 mL/min/{1.73_m2} (ref >60)
Globulin: 4 g/dL (ref 2.0–4.0)
Glucose: 93 mg/dL (ref 65–100)
Potassium: 3.5 mmol/L (ref 3.5–5.1)
Protein, total: 7.8 g/dL (ref 6.4–8.2)
Sodium: 137 mmol/L (ref 136–145)

## 2017-04-20 LAB — CBC W/O DIFF
ABSOLUTE NRBC: 0 10*3/uL (ref 0.00–0.01)
HCT: 38.9 % (ref 35.0–47.0)
HGB: 13.6 g/dL (ref 11.5–16.0)
MCH: 33.9 PG (ref 26.0–34.0)
MCHC: 35 g/dL (ref 30.0–36.5)
MCV: 97 FL (ref 80.0–99.0)
MPV: 8.9 FL (ref 8.9–12.9)
NRBC: 0 PER 100 WBC
PLATELET: 259 10*3/uL (ref 150–400)
RBC: 4.01 M/uL (ref 3.80–5.20)
RDW: 12.5 % (ref 11.5–14.5)
WBC: 4.2 10*3/uL (ref 3.6–11.0)

## 2017-04-20 LAB — TYPE & SCREEN
ABO/Rh(D): A POS
Antibody screen: NEGATIVE

## 2017-04-20 LAB — HEMOGLOBIN A1C WITH EAG
Est. average glucose: 97 mg/dL
Hemoglobin A1c: 5 % (ref 4.2–6.3)

## 2017-04-20 LAB — TYPE AND SCREEN
ABO/Rh: A POS
Antibody Screen: NEGATIVE

## 2017-04-20 LAB — EKG 12-LEAD
Atrial Rate: 81 {beats}/min
Diagnosis: NORMAL
P Axis: 38 degrees
P-R Interval: 168 ms
Q-T Interval: 392 ms
QRS Duration: 90 ms
QTc Calculation (Bazett): 455 ms
R Axis: 20 degrees
T Axis: 35 degrees
Ventricular Rate: 81 {beats}/min

## 2017-04-20 NOTE — Other (Signed)
Preventing Infections Before ??? and After ??? Your Surgery  IMPORTANT INSTRUCTIONS    Please read and follow these instructions carefully.    Every Night for Three (3) nights before your surgery:  1. Shower with an antibacterial soap, such as Dial, or the soap provided at your preassessment appointment. A shower is better than a bath for cleaning your skin.  2. If needed, ask someone to help you reach all areas of your body. Don???t forget to clean your belly button with every shower.  The night before your surgery:  1. On the night before your surgery, shower with an antibacterial soap, such as Dial, or the soap provided at your preassessment appointment.   2. With one packet of Hibiclens in hand, turn water off.  3. Apply Hibiclens antiseptic skin cleanser with a clean, freshly washed washcloth.  ? Gently apply to your body from chin to toes (except the genital area) and especially the area(s) where your incision(s) will be.  ? Leave Hibiclens on your skin for at least 20 seconds.    CAUTION: If needed, Hibiclens may be used to clean the folds of skin of the legs (such as in the area of the groin) and on your buttocks and hips. However, do not use Hibiclens above the neck or in the genital area (your bottom) or put inside any area of your body.  4. Turn the water back on and rinse.  5. Dry gently with a clean, freshly washed towel.  6. After your shower, do not use any powder, deodorant, perfumes or lotion.  7. Use clean, freshly washed towels and washcloths every time you shower.  8. Wear clean, freshly washed pajamas to bed the night before surgery.  9. Sleep on clean, freshly washed sheets.  10. Do not allow pets to sleep in your bed with you.    The Morning of your surgery:  1. Shower again thoroughly with an antibacterial soap, such as Dial or the soap provided at your preassessment appointment. If needed, ask someone for help to reach all areas of your body. Don???t forget to clean your belly button! Rinse.   2. Dry gently with a clean, freshly washed towel.  3. After your shower, do not use any powder, deodorant, perfumes or lotion prior to surgery.  4. Put on clean, freshly washed clothing.    Tips to help prevent infections after your surgery:  1. Protect your surgical wound from germs:  ? Hand washing is the most important thing you and your caregivers can do to prevent infections.  ? Keep your bandage clean and dry!  ? Do not touch your surgical wound.  2. Use clean, freshly washed towels and washcloths every time you shower; do not share bath linens with others.  3. Until your surgical wound is healed, wear clothing and sleep on bed linens each day that are clean and freshly washed.  4. Do not allow pets to sleep in your bed with you or touch your surgical wound.  5. Do not smoke ??? smoking delays wound healing. This may be a good time to stop smoking.  6. If you have diabetes, it is important for you to manage your blood sugar levels properly before your surgery as well as after your surgery. Poorly managed blood sugar levels slow down wound healing and prevent you from healing completely.

## 2017-04-20 NOTE — Undefined (Signed)
PC requesting referral to [facility] for PT/OT evaluation prior to surgery.  Referral orders placed in CC.

## 2017-04-20 NOTE — Undefined (Signed)
Pt in for PAT assessment prior to TKR with Dr. Eugenie Birks on 05/04/17. PMHx reviewed:  Has hx of lupus, Meniere's disease, fibromyalgia, chronic pain (see list) and reports hx of multiple falls at home.  Requests that anyone speaking to her do so from the front or to the left as it makes her "uncomfortable if they are on the right".  Does not ambulate with a walker, cane or other assistive device.  States she has fallen at home previously and "given myself a concussion."  Reports living at home alone and states she has plans to be discharged to rehab facility after surgery but states that she does not believe that has been communicated to surgeon at this point.  Msg to case manager for [facility] regarding concerns regarding pt's hx of falls, Meniere's as well as plan for discharge. States she will reach out to pt this week. PC to nurse at PCP (Dr. Gilford Rile) office who relay concerns to Dr. Gilford Rile to determine if [facility] eval prior to surgery would benefit pt.  Will call back with further recommendations for pt. PT also advised prior to evaluation of concerns and pt hx.

## 2017-04-20 NOTE — Other (Signed)
Incentive Spirometer        Using the incentive spirometer helps expand the small air sacs of your lungs, helps you breathe deeply, and helps improve your lung function.  Use your incentive spirometer twice a day (10 breaths each time) prior to surgery.      How to Use Your Incentive Spirometer:  1. Hold the incentive spirometer in an upright position.   2. Breathe out as usual.   3. Place the mouthpiece in your mouth and seal your lips tightly around it.   4. Take a deep breath.  Breathe in slowly and as deeply as possible. Keep the blue flow rate guide between the arrows.   5. Hold your breath as long as possible. Then exhale slowly and allow the piston to fall to the bottom of the column.   6. Rest for a few seconds and repeat steps one through five at least 10 times.     PAT Tidal Volume______2000____________  x_____2___________  Date________08/27/18_______________    Katherine [name] THE INCENTIVE SPIROMETER WITH YOU TO THE HOSPITAL ON THE DAY OF YOUR SURGERY.  Opportunity given to ask and answer questions as well as to observe return demonstration.    Patient signature_____________________________    Witness____________________________

## 2017-04-20 NOTE — Other (Signed)
[facility]  Preoperative Instructions        Surgery Date 05/04/17          Time of Arrival to be called with time of arrival--Contact # [phone] cell    1. On the day of your surgery, please report to the Surgical Services Registration Desk and sign in at your designated time. The Surgery Center is located to the right of the Emergency Room.     2. You must have someone with you to drive you home. You should not drive a car for 24 hours following surgery. Please make arrangements for a friend or family member to stay with you for the first 24 hours after your surgery.    3. Do not have anything to eat or drink (including water, gum, mints, coffee, juice) after midnight ?05/03/17?      Marland Kitchen?This may not apply to medications prescribed by your physician. ?(Please note below the special instructions with medications to take the morning of your procedure.)    4. We recommend you do not drink any alcoholic beverages for 24 hours before and after your surgery.    5. Contact your surgeon???s office for instructions on the following medications: non-steroidal anti-inflammatory drugs (i.e. Advil, Aleve), vitamins, and supplements. (Some surgeon???s will want you to stop these medications prior to surgery and others may allow you to take them)  **If you are currently taking Plavix, Coumadin, Aspirin and/or other blood-thinning agents, contact your surgeon for instructions.** Your surgeon will partner with the physician prescribing these medications to determine if it is safe to stop or if you need to continue taking.  Please do not stop taking these medications without instructions from your surgeon    6. Wear comfortable clothes.  Wear glasses instead of contacts.  Do not bring any money or jewelry. Please bring picture ID, insurance card, and any prearranged co-payment or hospital payment.  Do not wear make-up, particularly mascara the morning of your surgery.  Do not wear nail  polish, particularly if you are having foot /hand surgery.  Wear your hair loose or down, no ponytails, buns, bobby pins or clips.  All body piercings must be removed.  Please shower with antibacterial soap for three consecutive days before and on the morning of surgery, but do not apply any lotions, powders or deodorants after the shower on the day of surgery. Please use a fresh towels after each shower. Please sleep in clean clothes and change bed linens the night before surgery.  Please do not shave for 48 hours prior to surgery. Shaving of the face is acceptable.    7. You should understand that if you do not follow these instructions your surgery may be cancelled.  If your physical condition changes (I.e. fever, cold or flu) please contact your surgeon as soon as possible.    8. It is important that you be on time.  If a situation occurs where you may be late, please call [phone] (OR Holding Area).    9. If you have any questions and or problems, please call [phone] (Pre-admission Testing).    10. Your surgery time may be subject to change.  You will receive a phone call the evening prior if your time changes.    11.  If having outpatient surgery, you must have someone to drive you here, stay with you during the duration of your stay, and to drive you home at time of discharge.    12.  In an effort to improve the efficiency, privacy, and safety for all of our Pre-op patients visitors are not allowed in the Holding area.  Once you arrive and are registered your family/visitors will be asked to remain in the waiting room.  The Pre-op staff will get you from the Surgical Waiting Area and will explain to you and your family/visitors that the Pre-op phase is beginning.  The staff will answer any questions and provide instructions for tracking of the patient, by use of the existing tracking number and color-coded status board in the waiting room.  At this  time the staff will also ask for your designated spokesperson information in the event that the physician or staff need to provide an update or obtain any pertinent information. The designated spokesperson will be notified if the physician needs to speak to family during the pre-operative phase.  If at any time your family/visitors has questions or concerns they may approach the volunteer desk in the waiting area for assistance.         Special Instructions:Please practice with incentive spirometry /please bring incentive spirometry back to the hospital for use    MEDICATIONS TO TAKE THE MORNING OF SURGERY WITH A SIP OF WATER:nasal spray,ranitidine,eye drops,allergy medication,cymbalta,valtrex,gabapentin,pain medication and lorazepam as needed,plaquenil,primidone      I understand a pre-operative phone call will be made to verify my surgery time.  In the event that I am not available, I give permission for a message to be left on my answering service and/or with another person?  yes         ___________________      __________   _________    (Signature of Patient)             (Witness)                (Date and Time)

## 2017-04-20 NOTE — Progress Notes (Signed)
[facility]  Physical Therapy Pre-surgery evaluation  [address] [place] [address]    physical Therapy pre TKR surgery EVALUATION  Patient: Katherine [name] (61 y.o. 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 Patient demonstrates significantly impaired standing balance and impaired coordination. Gait is unsteady and mildly ataxic without use of AD and patient frequently reaching for external support also requiring min A to maintain balance. Gait stability improved with use of RW.   Patient with h/o Meniere's Disease following shingles to ears and has had significant balance deficits for last 10 years. Patient does not use any AD and lives alone. She reports falls on almost a daily basis. Balance impairments are significant with patient scoring a 10/56 on the Berg balance test.  Patient reports impaired hearing in L ear and has difficulty tolerating head turns secondary to instability.  Strongly recommend use of RW for ambulation at this time and issued walker for patient to borrow until one can be obtained for patient. Recommend HHPT prior to surgery for balance training and to ensure safe mobility within home. Discussed need for increased supervision at home with sister who was present for evaluation.   Strongly recommend inpatient rehab following surgery for intensive balance training to improve overall.   Discussed anticipated disposition to home with possible discharge within a 1 to 2 day time frame post-surgery. Patient and coach in agreement.     GOALS: (Goals have been discussed and agreed upon with patient.)  DISCHARGE GOALS: Time Frame: 1 DAY  1. Patient will demonstrate increased strength, range of motion, and pain  control via a home exercise program in order to minimize functional deficits in preparation for their upcoming surgery. This will be achieved by using education, demonstration and through the use of an informational handout including a home exercise program.  REHABILITATION POTENTIAL FOR STATED GOALS: Good     RECOMMENDATIONS AND PLANNED INTERVENTIONS: (Benefits and precautions of physical therapy have been discussed with the patient.)  1. Home Exercise Program  TREATMENT PLAN EFFECTIVE DATES: 04/20/2017 TO 04/20/2017  FREQUENCY/DURATION: Patient to continue to perform home exercise program at least twice daily until her surgery.     SUBJECTIVE:   Patient stated ???I have a lot of balance problems.???    OBJECTIVE DATA SUMMARY:   HISTORY:    Past Medical History:   Diagnosis Date   ??? Arthritis    ??? Autoimmune disease ([place])    ??? Bowel disease    ??? Burning with urination    ??? Chronic pain    ??? Depression    ??? Diarrhea    ??? Difficult intubation     difficulty breathing after awakening in past.   ??? Dizziness    ??? Easy bruising    ??? Headache(784.0)    ??? Hearing loss    ??? Ill-defined condition     past hx of shingles--eye/ear 13 years ago   ??? Night sweats    ??? Numbness    ??? Other ill-defined conditions(799.89)     shingles, lupus, menieres, Fibromyalgia   ??? Sweats, menopausal    ??? Visual loss      Past Surgical History:   Procedure Laterality  Date   ??? ABDOMEN SURGERY PROC UNLISTED  1994    1 foot of colon removed   ??? HX APPENDECTOMY     ??? HX BREAST BIOPSY Left     benign core bx   ??? HX CHOLECYSTECTOMY     ??? HX CORNEAL TRANSPLANT      right   ??? HX GYN      hysterectomy   ??? HX HERNIA REPAIR      left   ??? HX ORTHOPAEDIC      back x2, right knee   ??? HX ORTHOPAEDIC  01-2014     right hand surgery   ??? HX TONSILLECTOMY     ??? HX TRANSPLANT      right corona   ??? HX UROLOGICAL      bladder surgery x2     Prior Level of Function/Home Situation: patient reports independence with  all mobility; she does not use any AD but reports frequent almost daily falls; lives alone and reports short distance driving occasionally; h/o balance deficits secondary to shingles in ears with development of Meniere's disease      Home Situation  Home Environment: Private residence  # Steps to Enter: 4  Rails to Enter: Yes  Hand Rails : Bilateral  One/Two Story Residence: One story  Living Alone: Yes  Support Systems: Family member(s)  Patient Expects to be Discharged to:: Private residence  Current DME Used/Available at Home: Grab bars  Tub or Shower Type: Shower    EXAMINATION/PRESENTATION/DECISION MAKING:     ADLs (Current Functional Status):   Bathing/Showering:   [x]  Independent  []  Requires Assistance from Someone  []  Sponge Bath Only   Ambulation:  [x]  Independent- reports almost daily falls but does not use any AD  []  Walk Indoors Only  []  Walk Outdoors  []  Use Assistive Device  []  Use Wheelchair Only     Dressing:  [x]  Independent    Requires Assistance from Someone for:  []  Sock/Shoes  []  Pants  []  Everything   Household Activities:  []  Routine house and yard work  [x]  Light Housework Only  []  None       Critical Behavior:  Neurologic State: Alert, Appropriate for age  Orientation Level: Oriented X4          Strength:    Strength: Generally decreased, functional                    Tone & Sensation:   Tone: Abnormal (tremors with active movement in RLE)              Sensation:  (intermittent numbness in tingling in hands and feet)               Range Of Motion:  AROM: Generally decreased, functional                       Coordination:  Coordination: Generally decreased, functional    Functional Mobility:  Transfers:  Sit to Stand: Minimum assistance  Stand to Sit: Minimum assistance                       Balance:   Sitting: Intact  Standing: Impaired  Standing - Static: Poor  Standing - Dynamic : Poor (without AD; fair using RW for support)  Ambulation/Gait Training:  Distance (ft): 30 Feet (ft)   Assistive Device:  (HHA of 1 transitioning to RW)  Ambulation - Level of  Assistance: Minimal assistance        Gait Abnormalities: Ataxic, Decreased step clearance, Path deviations (several losses of balance reaching for external support)        Base of Support: Narrowed, Center of gravity altered     Speed/Cadence: Pace decreased (<100 feet/min), Delayed  Step Length: Left shortened, Right shortened      Patient ambulated 3o feet without use of AD- gait is very unsteady and mildly ataxic demonstrating impaired center of gravity; guarded gait; slow cadence with short shuffled steps and frequently reaching for external support and frequent mild losses of balance noted requiring min A to maintain balance. Transitioned to RW and gait stability improved demonstrating no overt LOB       Therapeutic Exercises:   The patient was educated in, has demonstrated, and has received written instructions to complete for their home exercise program per total knee replacement protocol.      Functional Measure:  Lower Extremity Functional Scale (LEFS):      Score 30/80     Percentage of impairment CH  0% CI  1-19% CJ  20-39% CK  40-59% CL  60-79% CM  80-99% CN  100%   LEFS score:  0-80 80 64-79 47-63 31-46 16-30 1-15 0     Cutt-offs: None established  TKA and THA:   (Stratford et al, 2000)  MDC = 9 points   MCID = 9 points           G codes:  In compliance with CMS???s Claims Based Outcome Reporting, the following G-code set was chosen for this patient based on their primary functional limitation being treated:    The outcome measure chosen to determine the severity of the functional limitation was the LEFS with a score of 30/80 which was correlated with the impairment scale.    ? Mobility - Walking and Moving Around:    [phone] - CURRENT STATUS: CL - 60%-79% impaired, limited or restricted   M0102 - GOAL STATUS: CL - 60%-79% impaired, limited or restricted   V2536 - D/C STATUS:  CL - 60%-79% impaired, limited or restricted     Pain:   Pain Scale 1: Numeric (0 - 10)                   Activity Tolerance:   Patient []    does  [x]    does not demonstrate signs/symptoms of shortness of breath/dyspnea on exertion/respiratory distress.  COMMUNICATION/EDUCATION:   The patient was educated on:  [x]          Importance of post-operative mobility to achieve their desired outcomes and restore biological function  [x]          The key post-operative time frame to address ROM to prevent additional complications    The patient???s plan of care was discussed with:   [x]          The patient verbalized understanding of her plan in preparation for their upcoming surgery  [x]          The patient's coach was present for this session  []          The patient reports that he/she does not have a coach identified at this time  [x]          The coach verbalized understanding of the education regarding the patient's upcoming surgery  [x]          Patient/family agree to work toward stated goals and plan of care.  []   Patient understands intent and goals of therapy, but is neutral about his/her participation.  []          Patient is unable to participate in goal setting and plan of care.    Thank you for this referral.  Kristine Royal, PT   Time Calculation: 52 mins

## 2017-04-21 LAB — CULTURE, MRSA

## 2017-04-23 ENCOUNTER — Encounter: Admit: 2017-04-23 | Discharge: 2017-04-23 | Payer: MEDICARE | Primary: Family Medicine

## 2017-04-29 ENCOUNTER — Encounter: Admit: 2017-04-29 | Discharge: 2017-04-29 | Payer: MEDICARE | Primary: Family Medicine

## 2017-04-30 ENCOUNTER — Encounter: Primary: Family Medicine

## 2017-05-01 ENCOUNTER — Encounter: Admit: 2017-05-01 | Discharge: 2017-05-01 | Payer: MEDICARE | Primary: Family Medicine

## 2017-05-01 NOTE — Unmapped (Signed)
Formatting of this note might be different from the original.  Pre-op call made and patient given TOA. Patient instructed may have up to 8 ounces of water up to 1145 am and then NPO.   Electronically signed by Creta Levin, RN at 05/01/2017  2:34 PM EDT

## 2017-05-01 NOTE — Other (Signed)
Pre-op call made and patient given TOA. Patient instructed may have up to 8 ounces of water up to 1145 am and then NPO.

## 2017-05-04 ENCOUNTER — Inpatient Hospital Stay
Admit: 2017-05-04 | Discharge: 2017-05-06 | Disposition: A | Discharge status: Skilled Nursing Facility | Payer: MEDICARE | Attending: Orthopaedic Surgery | Admitting: Orthopaedic Surgery

## 2017-05-04 DIAGNOSIS — M1711 Unilateral primary osteoarthritis, right knee: Secondary | ICD-10-CM

## 2017-05-04 MED ORDER — SCOPOLAMINE (1.3-1.5) MG 72 HR TRANSDERM PATCH
1 mg over 3 days | TRANSDERMAL | Status: DC
Start: 2017-05-04 — End: 2017-05-04

## 2017-05-04 MED ORDER — BACITRACIN 50,000 UNIT IM
50000 unit | INTRAMUSCULAR | Status: DC | PRN
Start: 2017-05-04 — End: 2017-05-04
  Administered 2017-05-04: 21:00:00

## 2017-05-04 MED ORDER — SODIUM CHLORIDE 0.9 % IJ SYRG
Freq: Three times a day (TID) | INTRAMUSCULAR | Status: DC
Start: 2017-05-04 — End: 2017-05-04
  Administered 2017-05-04: 19:00:00 via INTRAVENOUS

## 2017-05-04 MED ORDER — LACTATED RINGERS IV
INTRAVENOUS | Status: DC
Start: 2017-05-04 — End: 2017-05-04
  Administered 2017-05-04: 19:00:00 via INTRAVENOUS

## 2017-05-04 MED ORDER — NALOXONE 0.4 MG/ML INJECTION
0.4 mg/mL | INTRAMUSCULAR | Status: DC | PRN
Start: 2017-05-04 — End: 2017-05-06

## 2017-05-04 MED ORDER — ONDANSETRON (PF) 4 MG/2 ML INJECTION
4 mg/2 mL | INTRAMUSCULAR | Status: DC | PRN
Start: 2017-05-04 — End: 2017-05-04
  Administered 2017-05-04: 22:00:00 via INTRAVENOUS

## 2017-05-04 MED ORDER — MEPERIDINE (PF) 25 MG/ML INJ SOLUTION
25 mg/ml | INTRAMUSCULAR | Status: DC | PRN
Start: 2017-05-04 — End: 2017-05-04

## 2017-05-04 MED ORDER — ASPIRIN 325 MG TAB, DELAYED RELEASE
325 mg | Freq: Two times a day (BID) | ORAL | Status: DC
Start: 2017-05-04 — End: 2017-05-06
  Administered 2017-05-05 – 2017-05-06 (×3): via ORAL

## 2017-05-04 MED ORDER — ROCURONIUM 10 MG/ML IV
10 mg/mL | INTRAVENOUS | Status: DC | PRN
Start: 2017-05-04 — End: 2017-05-04
  Administered 2017-05-04: 21:00:00 via INTRAVENOUS

## 2017-05-04 MED ORDER — SURGICAL PAIN SOLUTION (PYXIS)
INTRAMUSCULAR | Status: AC
Start: 2017-05-04 — End: ?

## 2017-05-04 MED ORDER — ROCURONIUM 10 MG/ML IV
10 mg/mL | INTRAVENOUS | Status: AC
Start: 2017-05-04 — End: ?

## 2017-05-04 MED ORDER — PHENYLEPHRINE IN 0.9 % SODIUM CL (40 MCG/ML) IV SYRINGE
0.4 mg/10 mL (40 mcg/mL) | INTRAVENOUS | Status: DC | PRN
Start: 2017-05-04 — End: 2017-05-04
  Administered 2017-05-04 (×2): via INTRAVENOUS

## 2017-05-04 MED ORDER — ONDANSETRON (PF) 4 MG/2 ML INJECTION
4 mg/2 mL | INTRAMUSCULAR | Status: AC
Start: 2017-05-04 — End: ?

## 2017-05-04 MED ORDER — PRIMIDONE 50 MG TAB
50 mg | Freq: Three times a day (TID) | ORAL | Status: DC
Start: 2017-05-04 — End: 2017-05-06
  Administered 2017-05-05 – 2017-05-06 (×5): via ORAL

## 2017-05-04 MED ORDER — MIDAZOLAM 1 MG/ML IJ SOLN
1 mg/mL | INTRAMUSCULAR | Status: AC
Start: 2017-05-04 — End: 2017-05-04
  Administered 2017-05-04: 19:00:00 via INTRAVENOUS

## 2017-05-04 MED ORDER — HYDROMORPHONE (PF) 2 MG/ML IJ SOLN
2 mg/mL | INTRAMUSCULAR | Status: DC | PRN
Start: 2017-05-04 — End: 2017-05-04
  Administered 2017-05-04 (×4): via INTRAVENOUS

## 2017-05-04 MED ORDER — SODIUM CHLORIDE 0.9 % IJ SYRG
INTRAMUSCULAR | Status: DC | PRN
Start: 2017-05-04 — End: 2017-05-06

## 2017-05-04 MED ORDER — DIPHENHYDRAMINE HCL 50 MG/ML IJ SOLN
50 mg/mL | INTRAMUSCULAR | Status: DC | PRN
Start: 2017-05-04 — End: 2017-05-04

## 2017-05-04 MED ORDER — SODIUM CHLORIDE 0.9 % IV
1000 mg/10 mL (100 mg/mL) | INTRAVENOUS | Status: DC | PRN
Start: 2017-05-04 — End: 2017-05-04
  Administered 2017-05-04 (×2): via INTRAVENOUS

## 2017-05-04 MED ORDER — PROPOFOL 10 MG/ML IV EMUL
10 mg/mL | INTRAVENOUS | Status: AC
Start: 2017-05-04 — End: ?

## 2017-05-04 MED ORDER — FENTANYL CITRATE (PF) 50 MCG/ML IJ SOLN
50 mcg/mL | INTRAMUSCULAR | Status: DC | PRN
Start: 2017-05-04 — End: 2017-05-04
  Administered 2017-05-04: 20:00:00 via INTRAVENOUS

## 2017-05-04 MED ORDER — VANCOMYCIN IN 0.9% SODIUM CHLORIDE 1.5 G/500 ML IV
1.5 g/500 mL | INTRAVENOUS | Status: AC
Start: 2017-05-04 — End: ?

## 2017-05-04 MED ORDER — HYDROMORPHONE (PF) 1 MG/ML IJ SOLN
1 mg/mL | INTRAMUSCULAR | Status: DC | PRN
Start: 2017-05-04 — End: 2017-05-04

## 2017-05-04 MED ORDER — SENNOSIDES-DOCUSATE SODIUM 8.6 MG-50 MG TAB
Freq: Two times a day (BID) | ORAL | Status: DC
Start: 2017-05-04 — End: 2017-05-06
  Administered 2017-05-05 – 2017-05-06 (×4): via ORAL

## 2017-05-04 MED ORDER — VALACYCLOVIR 500 MG TAB
500 mg | ORAL | Status: DC
Start: 2017-05-04 — End: 2017-05-06
  Administered 2017-05-05 – 2017-05-06 (×2): via ORAL

## 2017-05-04 MED ORDER — TRANEXAMIC ACID 1,000 MG/10 ML (100 MG/ML) IV
1000 mg/10 mL (100 mg/mL) | INTRAVENOUS | Status: AC
Start: 2017-05-04 — End: ?

## 2017-05-04 MED ORDER — BACITRACIN 50,000 UNIT IM
50000 unit | INTRAMUSCULAR | Status: AC
Start: 2017-05-04 — End: ?

## 2017-05-04 MED ORDER — DEXAMETHASONE SODIUM PHOSPHATE 4 MG/ML IJ SOLN
4 mg/mL | INTRAMUSCULAR | Status: DC | PRN
Start: 2017-05-04 — End: 2017-05-04
  Administered 2017-05-04: 21:00:00 via INTRAVENOUS

## 2017-05-04 MED ORDER — DIPHENHYDRAMINE 12.5 MG/5 ML ELIXIR
12.5 mg/5 mL | Freq: Four times a day (QID) | ORAL | Status: DC | PRN
Start: 2017-05-04 — End: 2017-05-06
  Administered 2017-05-06: 17:00:00 via ORAL

## 2017-05-04 MED ORDER — MIDAZOLAM 1 MG/ML IJ SOLN
1 mg/mL | INTRAMUSCULAR | Status: AC
Start: 2017-05-04 — End: ?

## 2017-05-04 MED ORDER — KETOROLAC TROMETHAMINE 30 MG/ML INJECTION
30 mg/mL (1 mL) | Freq: Four times a day (QID) | INTRAMUSCULAR | Status: AC
Start: 2017-05-04 — End: 2017-05-05
  Administered 2017-05-05 (×4): via INTRAVENOUS

## 2017-05-04 MED ORDER — PHENYLEPHRINE IN 0.9 % SODIUM CL (40 MCG/ML) IV SYRINGE
0.4 mg/10 mL (40 mcg/mL) | INTRAVENOUS | Status: AC
Start: 2017-05-04 — End: ?

## 2017-05-04 MED ORDER — ACETAMINOPHEN 325 MG TABLET
325 mg | Freq: Four times a day (QID) | ORAL | Status: DC
Start: 2017-05-04 — End: 2017-05-06
  Administered 2017-05-05 – 2017-05-06 (×7): via ORAL

## 2017-05-04 MED ORDER — SUCCINYLCHOLINE CHLORIDE 20 MG/ML INJECTION
20 mg/mL | INTRAMUSCULAR | Status: AC
Start: 2017-05-04 — End: ?

## 2017-05-04 MED ORDER — HYDROMORPHONE 2 MG/ML INJECTION SOLUTION
2 mg/mL | INTRAMUSCULAR | Status: AC
Start: 2017-05-04 — End: ?

## 2017-05-04 MED ORDER — VANCOMYCIN IN 0.9% SODIUM CHLORIDE 1.5 G/500 ML IV
1.5 g/500 mL | Freq: Two times a day (BID) | INTRAVENOUS | Status: AC
Start: 2017-05-04 — End: 2017-05-05
  Administered 2017-05-05: 08:00:00 via INTRAVENOUS

## 2017-05-04 MED ORDER — LEVOFLOXACIN IN D5W 500 MG/100 ML IV PIGGY BACK
500 mg/100 mL | Freq: Once | INTRAVENOUS | Status: AC
Start: 2017-05-04 — End: 2017-05-04
  Administered 2017-05-04: 21:00:00 via INTRAVENOUS

## 2017-05-04 MED ORDER — SODIUM CHLORIDE 0.9 % IJ SYRG
INTRAMUSCULAR | Status: DC | PRN
Start: 2017-05-04 — End: 2017-05-04

## 2017-05-04 MED ORDER — OXYCODONE 5 MG TAB
5 mg | ORAL | Status: DC | PRN
Start: 2017-05-04 — End: 2017-05-06
  Administered 2017-05-05: 01:00:00 via ORAL

## 2017-05-04 MED ORDER — DULOXETINE 30 MG CAP, DELAYED RELEASE
30 mg | Freq: Two times a day (BID) | ORAL | Status: DC
Start: 2017-05-04 — End: 2017-05-06
  Administered 2017-05-05 – 2017-05-06 (×4): via ORAL

## 2017-05-04 MED ORDER — FAMOTIDINE 20 MG TAB
20 mg | Freq: Two times a day (BID) | ORAL | Status: DC
Start: 2017-05-04 — End: 2017-05-04

## 2017-05-04 MED ORDER — MIDAZOLAM 1 MG/ML IJ SOLN
1 mg/mL | INTRAMUSCULAR | Status: DC | PRN
Start: 2017-05-04 — End: 2017-05-04
  Administered 2017-05-04: 20:00:00 via INTRAVENOUS

## 2017-05-04 MED ORDER — KETOROLAC TROMETHAMINE 30 MG/ML INJECTION
30 mg/mL (1 mL) | INTRAMUSCULAR | Status: AC
Start: 2017-05-04 — End: 2017-05-05
  Administered 2017-05-04: 23:00:00

## 2017-05-04 MED ORDER — FAMOTIDINE 20 MG TAB
20 mg | Freq: Two times a day (BID) | ORAL | Status: DC
Start: 2017-05-04 — End: 2017-05-06
  Administered 2017-05-05 – 2017-05-06 (×4): via ORAL

## 2017-05-04 MED ORDER — SURGICAL PAIN SOLUTION (PYXIS)
INTRAMUSCULAR | Status: DC | PRN
Start: 2017-05-04 — End: 2017-05-04
  Administered 2017-05-04: 22:00:00 via INTRAMUSCULAR

## 2017-05-04 MED ORDER — KETOROLAC TROMETHAMINE 30 MG/ML INJECTION
30 mg/mL (1 mL) | Freq: Four times a day (QID) | INTRAMUSCULAR | Status: DC
Start: 2017-05-04 — End: 2017-05-04

## 2017-05-04 MED ORDER — VANCOMYCIN IN 0.9% SODIUM CHLORIDE 1.5 G/500 ML IV
1.5 g/500 mL | Freq: Once | INTRAVENOUS | Status: AC
Start: 2017-05-04 — End: 2017-05-04
  Administered 2017-05-04: 19:00:00 via INTRAVENOUS

## 2017-05-04 MED ORDER — PREGABALIN 75 MG CAP
75 mg | Freq: Once | ORAL | Status: AC
Start: 2017-05-04 — End: 2017-05-04
  Administered 2017-05-04: 19:00:00 via ORAL

## 2017-05-04 MED ORDER — POLYETHYLENE GLYCOL 3350 17 GRAM (100 %) ORAL POWDER PACKET
17 gram | Freq: Every day | ORAL | Status: DC
Start: 2017-05-04 — End: 2017-05-06
  Administered 2017-05-05 – 2017-05-06 (×3): via ORAL

## 2017-05-04 MED ORDER — EPHEDRINE SULFATE 50 MG/ML INJECTION SOLUTION
50 mg/mL | INTRAMUSCULAR | Status: DC | PRN
Start: 2017-05-04 — End: 2017-05-04
  Administered 2017-05-04 (×2): via INTRAVENOUS

## 2017-05-04 MED ORDER — DEXAMETHASONE SODIUM PHOSPHATE 4 MG/ML IJ SOLN
4 mg/mL | Freq: Every day | INTRAMUSCULAR | Status: AC
Start: 2017-05-04 — End: 2017-05-05
  Administered 2017-05-05: 21:00:00 via INTRAVENOUS

## 2017-05-04 MED ORDER — NORTRIPTYLINE 25 MG CAP
25 mg | Freq: Every evening | ORAL | Status: DC
Start: 2017-05-04 — End: 2017-05-06
  Administered 2017-05-05 – 2017-05-06 (×2): via ORAL

## 2017-05-04 MED ORDER — LIDOCAINE (PF) 10 MG/ML (1 %) IJ SOLN
10 mg/mL (1 %) | INTRAMUSCULAR | Status: DC | PRN
Start: 2017-05-04 — End: 2017-05-04

## 2017-05-04 MED ORDER — SODIUM CHLORIDE 0.9 % IV
INTRAVENOUS | Status: AC
Start: 2017-05-04 — End: 2017-05-05
  Administered 2017-05-04: 22:00:00 via INTRAVENOUS

## 2017-05-04 MED ORDER — CELECOXIB 200 MG CAP
200 mg | Freq: Once | ORAL | Status: AC
Start: 2017-05-04 — End: 2017-05-04
  Administered 2017-05-04: 19:00:00 via ORAL

## 2017-05-04 MED ORDER — GABAPENTIN 300 MG CAP
300 mg | Freq: Three times a day (TID) | ORAL | Status: DC
Start: 2017-05-04 — End: 2017-05-06
  Administered 2017-05-05 – 2017-05-06 (×5): via ORAL

## 2017-05-04 MED ORDER — LEVOFLOXACIN IN D5W 500 MG/100 ML IV PIGGY BACK
500 mg/100 mL | INTRAVENOUS | Status: AC
Start: 2017-05-04 — End: ?

## 2017-05-04 MED ORDER — LIDOCAINE (PF) 20 MG/ML (2 %) IJ SOLN
20 mg/mL (2 %) | INTRAMUSCULAR | Status: DC | PRN
Start: 2017-05-04 — End: 2017-05-04
  Administered 2017-05-04: 20:00:00 via INTRAVENOUS

## 2017-05-04 MED ORDER — FENTANYL CITRATE (PF) 50 MCG/ML IJ SOLN
50 mcg/mL | INTRAMUSCULAR | Status: AC
Start: 2017-05-04 — End: ?

## 2017-05-04 MED ORDER — AZELASTINE 137 MCG NASAL SPRAY AEROSOL
137 mcg (0.1 %) | Freq: Two times a day (BID) | NASAL | Status: DC
Start: 2017-05-04 — End: 2017-05-06
  Administered 2017-05-05 – 2017-05-06 (×3): via NASAL

## 2017-05-04 MED ORDER — DEXAMETHASONE SODIUM PHOSPHATE 4 MG/ML IJ SOLN
4 mg/mL | INTRAMUSCULAR | Status: AC
Start: 2017-05-04 — End: ?

## 2017-05-04 MED ORDER — SODIUM CHLORIDE 0.9 % IRRIGATION SOLN
0.9 % | Status: DC | PRN
Start: 2017-05-04 — End: 2017-05-04
  Administered 2017-05-04: 21:00:00

## 2017-05-04 MED ORDER — ONDANSETRON (PF) 4 MG/2 ML INJECTION
4 mg/2 mL | INTRAMUSCULAR | Status: DC | PRN
Start: 2017-05-04 — End: 2017-05-06

## 2017-05-04 MED ORDER — PROPOFOL 10 MG/ML IV EMUL
10 mg/mL | INTRAVENOUS | Status: DC | PRN
Start: 2017-05-04 — End: 2017-05-04
  Administered 2017-05-04: 20:00:00 via INTRAVENOUS

## 2017-05-04 MED ORDER — BISACODYL 10 MG RECTAL SUPPOSITORY
10 mg | Freq: Every day | RECTAL | Status: DC | PRN
Start: 2017-05-04 — End: 2017-05-06

## 2017-05-04 MED ORDER — HYDROMORPHONE (PF) 2 MG/ML IJ SOLN
2 mg/mL | INTRAMUSCULAR | Status: AC | PRN
Start: 2017-05-04 — End: 2017-05-05

## 2017-05-04 MED ORDER — OXYCODONE 5 MG TAB
5 mg | ORAL | Status: DC | PRN
Start: 2017-05-04 — End: 2017-05-06
  Administered 2017-05-05 – 2017-05-06 (×11): via ORAL

## 2017-05-04 MED ORDER — SODIUM CHLORIDE 0.9 % IJ SYRG
Freq: Three times a day (TID) | INTRAMUSCULAR | Status: DC
Start: 2017-05-04 — End: 2017-05-06
  Administered 2017-05-05 – 2017-05-06 (×5): via INTRAVENOUS

## 2017-05-04 MED ORDER — LACTATED RINGERS IV
INTRAVENOUS | Status: DC
Start: 2017-05-04 — End: 2017-05-04
  Administered 2017-05-04: 23:00:00 via INTRAVENOUS

## 2017-05-04 MED ORDER — ACETAMINOPHEN 325 MG TABLET
325 mg | ORAL | Status: AC
Start: 2017-05-04 — End: ?

## 2017-05-04 MED ORDER — PREDNISOLONE ACETATE 1 % EYE DROPS, SUSP
1 % | OPHTHALMIC | Status: DC
Start: 2017-05-04 — End: 2017-05-06
  Administered 2017-05-05 – 2017-05-06 (×2): via OPHTHALMIC

## 2017-05-04 MED ORDER — .PHARMACY TO SUBSTITUTE PER PROTOCOL
Status: DC | PRN
Start: 2017-05-04 — End: 2017-05-05

## 2017-05-04 MED ORDER — FENTANYL CITRATE (PF) 50 MCG/ML IJ SOLN
50 mcg/mL | INTRAMUSCULAR | Status: DC | PRN
Start: 2017-05-04 — End: 2017-05-04
  Administered 2017-05-04 (×4): via INTRAVENOUS

## 2017-05-04 MED ORDER — ACETAMINOPHEN 325 MG TABLET
325 mg | Freq: Once | ORAL | Status: AC
Start: 2017-05-04 — End: 2017-05-04
  Administered 2017-05-04: 19:00:00 via ORAL

## 2017-05-04 MED FILL — KETOROLAC TROMETHAMINE 30 MG/ML INJECTION: 30 mg/mL (1 mL) | INTRAMUSCULAR | Qty: 1

## 2017-05-04 MED FILL — LACTATED RINGERS IV: INTRAVENOUS | Qty: 1000

## 2017-05-04 MED FILL — ONDANSETRON (PF) 4 MG/2 ML INJECTION: 4 mg/2 mL | INTRAMUSCULAR | Qty: 2

## 2017-05-04 MED FILL — DEXAMETHASONE SODIUM PHOSPHATE 4 MG/ML IJ SOLN: 4 mg/mL | INTRAMUSCULAR | Qty: 5

## 2017-05-04 MED FILL — ROCURONIUM 10 MG/ML IV: 10 mg/mL | INTRAVENOUS | Qty: 5

## 2017-05-04 MED FILL — BACITRACIN 50,000 UNIT IM: 50000 unit | INTRAMUSCULAR | Qty: 100000

## 2017-05-04 MED FILL — FENTANYL CITRATE (PF) 50 MCG/ML IJ SOLN: 50 mcg/mL | INTRAMUSCULAR | Qty: 2

## 2017-05-04 MED FILL — MAPAP (ACETAMINOPHEN) 325 MG TABLET: 325 mg | ORAL | Qty: 2

## 2017-05-04 MED FILL — MIDAZOLAM 1 MG/ML IJ SOLN: 1 mg/mL | INTRAMUSCULAR | Qty: 2

## 2017-05-04 MED FILL — SODIUM CHLORIDE 0.9 % IV: INTRAVENOUS | Qty: 1000

## 2017-05-04 MED FILL — TRANEXAMIC ACID 1,000 MG/10 ML (100 MG/ML) IV: 1000 mg/10 mL (100 mg/mL) | INTRAVENOUS | Qty: 10

## 2017-05-04 MED FILL — PROPOFOL 10 MG/ML IV EMUL: 10 mg/mL | INTRAVENOUS | Qty: 20

## 2017-05-04 MED FILL — CELECOXIB 200 MG CAP: 200 mg | ORAL | Qty: 1

## 2017-05-04 MED FILL — TRANSDERM-SCOP 1 MG OVER 3 DAYS TRANSDERMAL PATCH: 1 mg over 3 days | TRANSDERMAL | Qty: 1

## 2017-05-04 MED FILL — QUELICIN 20 MG/ML INJECTION SOLUTION: 20 mg/mL | INTRAMUSCULAR | Qty: 10

## 2017-05-04 MED FILL — SURGICAL PAIN SOLUTION (PYXIS): INTRAMUSCULAR | Qty: 100

## 2017-05-04 MED FILL — VANCOMYCIN IN 0.9% SODIUM CHLORIDE 1.5 G/500 ML IV: 1.5 g/500 mL | INTRAVENOUS | Qty: 500

## 2017-05-04 MED FILL — LYRICA 75 MG CAPSULE: 75 mg | ORAL | Qty: 1

## 2017-05-04 MED FILL — HYDROMORPHONE 2 MG/ML INJECTION SOLUTION: 2 mg/mL | INTRAMUSCULAR | Qty: 1

## 2017-05-04 MED FILL — PHENYLEPHRINE IN 0.9 % SODIUM CL (40 MCG/ML) IV SYRINGE: 0.4 mg/10 mL (40 mcg/mL) | INTRAVENOUS | Qty: 10

## 2017-05-04 MED FILL — LEVOFLOXACIN IN D5W 500 MG/100 ML IV PIGGY BACK: 500 mg/100 mL | INTRAVENOUS | Qty: 100

## 2017-05-04 NOTE — Unmapped (Signed)
Formatting of this note might be different from the original.  1500:  Patient has been anxious re: surgery.  Verbal order given to administer versed 2 mg by anesthesia.  Nurse placed oxygen via NC at 2 l/m.  Patient became very sleepy & reported she felt better.  Will continue to monitor.    1522:  Dr. Patrecia Pace has been in to see the patient.  She reports she is anxious again.  She was medicated per [facility]    1530:  Patient snoring in bed.  VSS    1541:  OR Nurse at bedside  Electronically signed by Velora Heckler, RN at 05/04/2017  3:41 PM EDT

## 2017-05-04 NOTE — Progress Notes (Signed)
Formatting of this note is different from the original.  Ortho/ NeuroSurgery NP Note    POD# 0  s/p RIGHT TOTAL KNEE ARTHROPLASTY WITH NAVIGATION (surgical pain solution)   This note serves as a record of a chart review.    Most Recent Labs:   Lab Results   Component Value Date/Time    HGB 13.6 04/20/2017 09:16 AM    Hemoglobin A1c 5.0 04/20/2017 09:16 AM     Body mass index is 34.05 kg/(m^2). BMI greater than 30 is classified as obesity.     STOP BANG Score: 1    Plan:  1) PT BID starting tomorrow.  2) Peri-op Antibiotics Vancomycin and Levaquin  3) Aspirin 325 mg PO BID for DVT Prophylaxis   4) Discharge planning will begin on POD #1.     Admission Status Determination: Patient with frequent falls, neuropathic pain in multiple regions of her body, Lupus, Fibromyalgia, Meniere's, and questionable sleep apnea history.      Britta Mccreedy, NP    Electronically signed by Charlton Haws, NP at 05/04/2017  4:55 PM EDT

## 2017-05-04 NOTE — Op Note (Signed)
[place]  OPERATIVE REPORT    Name:Azer, Katherine [name]  MR#: [id number]  DOB: March 06, 1956  ACCOUNT #: [id number]   DATE OF SERVICE: 05/04/2017    PREOPERATIVE DIAGNOSIS:  Right knee severe end-stage osteoarthritis.    POSTOPERATIVE DIAGNOSIS:  Right knee severe end-stage osteoarthritis.    PROCEDURE PERFORMED:  Right total knee arthroplasty with navigation.    SURGEON:  Harvin Hazel, MD    ANESTHESIA:  General.    ASSISTANT:  Scrub.    COMPLICATIONS:  None.    ESTIMATED BLOOD LOSS:  100 mL.    SPECIMENS REMOVED:  None.    IMPLANTS:  See note.    INDICATIONS:  This is a 61 year old female who failed conservative management for severe end-stage osteoarthritis in her right knee.  Her pain had progressed to the point that normal daily activities were severely impacted.  The risks, benefits, and alternatives of surgery were explained in detail, and she agreed to proceed.    TECHNIQUE:  The patient was taken to the operating room and laid supine on the table.  General anesthesia was administered.  Vancomycin was given preoperatively per protocol and a tourniquet was applied to the right thigh above the knee.  The knee was then prepped and draped free in the usual sterile fashion and a timeout was called.    Following the timeout, the limb was exsanguinated and the tourniquet was inflated to 275 mmHg.  A midline incision was carried out followed by medial parapatellar approach.  The patella was everted and 10 mm was resected off the underside.  Following this, a metallic plate was placed on the cut surface of the patella.  It was placed in a non-everted position and swept laterally.  The knee was flexed and retractors were placed to expose the distal femur.  A retractor was pinned on the end of the femur and appropriate data points entered into the computer.  Distal femoral cut was then made at 9 mm from the high side, 5 degrees of flexion, neutral varus/valgus angulation.  A chamfer guide was placed in 4  degrees of external rotation referencing the posterior condyles for a size 5 femur.  Anterior, posterior, and chamfer cuts were then made.    Following this, the ACL was resected, the tibia was subluxed forward, and a tracker pinned in the proximal tibia.  After appropriate data point entry, a proximal tibial cut was then made at 8 mm from the high side, 3 degrees posterior slope, neutral varus/valgus angulation.  A laminar spreader was then placed at 90 degrees and the medial and lateral menisci were removed, and the posterior capsule was released and cauterized, and infiltrated with Marcaine with epinephrine, Toradol, and morphine.  The knee was trialed with a 5 femur and 5 tibia with a CS insert, which gave excellent stability while maintaining full range of motion.  The tray was pinned in place and the lug holes were drilled for the femur, which was then removed along with the insert, and the proximal tibia was punched to accept a press-fit size 5 Tritanium tibia.  All trial components were removed.  The raw bone surfaces were copiously irrigated.  A final size 5 press-fit tibial tray, Tritanium, was impacted into position.  A 5 femur was impacted into position as well.  A CS insert was placed and the knee was brought out into extension.  The patella was drilled for a 35 press-fit patella, which was impacted into position as well.  There was excellent  patellofemoral tracking and good stability, and full range of motion achieved.  The remainder of 3 liters of pulsatile lavage was irrigated through the joint.  Tourniquet was let down.  Bleeders were cauterized as encountered.  FloSeal was used.  The extensor mechanism was closed with interrupted #1 Vicryl, the subcu with 2-0 Vicryl, and the skin closed with staples.  Sterile dressing was applied followed by an Ace wrap.  The patient tolerated the procedure well and was stable to recovery room in satisfactory condition without complication.      Katherine Latus,  MD       RDW / DN  D: 06/12/2017 16:22     T: 06/13/2017 07:39  JOB #: [id number]

## 2017-05-04 NOTE — Progress Notes (Signed)
Formatting of this note might be different from the original.  Problem: Falls - Risk of  Goal: *Absence of Falls  Document Schmid Fall Risk and appropriate interventions in the flowsheet.   Outcome: Progressing Towards Goal  Fall Risk Interventions:                        Electronically signed by Raynelle Bring B at 05/04/2017  9:02 PM EDT

## 2017-05-04 NOTE — Unmapped (Signed)
Formatting of this note might be different from the original.  Floseal hemostatic agent to sterile field  Lot# [id number] Ref# [id number]  Electronically signed by Donnie Coffin at 05/04/2017  5:02 PM EDT

## 2017-05-04 NOTE — Unmapped (Signed)
Formatting of this note might be different from the original.  Handoff Report from Operating Room to PACU    Report received from [place] Tsiptsis RN  and Hassel Neth CRNA  regarding Katherine [name].      Surgeon(s):  Janece Canterbury, MD  And Procedure(s) (LRB):  RIGHT TOTAL KNEE ARTHROPLASTY WITH NAVIGATION (Right)  confirmed   with allergies and dressings discussed.    Anesthesia type, drugs, patient history, complications, estimated blood loss, vital signs, intake and output, and last pain medication were reviewed.  Electronically signed by Cindee Salt, RN at 05/04/2017  6:11 PM EDT

## 2017-05-04 NOTE — Unmapped (Signed)
Formatting of this note might be different from the original.  Over to preop to interview patient. Patient A&O x4 with no distress noted. Patient, procedure, site, and allergies verified via patient and consent. All questions answered.   Pt to OR via stretcher, moved self to OR bed with standby /safety assistance from staff. Final patient position approved by Dr. Patrecia Pace    Electronically signed by Donnie Coffin at 05/04/2017  4:59 PM EDT

## 2017-05-04 NOTE — Unmapped (Signed)
Formatting of this note is different from the original.  TRANSFER - OUT REPORT:    Verbal report given to Bradley Ferris RN (name) on Katherine [name]  being transferred to 3219(unit) for routine post - op       Report consisted of patient?s Situation, Background, Assessment and   Recommendations(SBAR).     Information from the following report(s) SBAR, Kardex, OR Summary, Intake/Output and MAR was reviewed with the receiving nurse.    Opportunity for questions and clarification was provided.      Patient transported with:   O2 @ 2 liters  Registered Nurse  Tech   Pt family updated of pt status and bed assignment      Electronically signed by Cindee Salt, RN at 05/04/2017  7:10 PM EDT

## 2017-05-04 NOTE — H&P (Signed)
Formatting of this note is different from the original.  HISTORY AND PHYSICAL    Subjective:     Patient is a 61 y.o. Caucasian female presented with a history of right knee pain.  Onset of symptoms was gradual with gradually worsening course since that time. She is being admitted for surgical management of this condition. The indications for the procedure include right knee severe osteoarthritis unresponsive to conservative treatment.    Patient Active Problem List    Diagnosis Date Noted   ? Osteoarthritis of right knee 05/04/2017   ? 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 Lumbar back pain with radiculopathy affecting left lower extremity 03/25/2017   ? Lumbar back pain with radiculopathy affecting right lower extremity 03/25/2017   ? Cervical radiculopathy due to degenerative joint disease of spine 03/25/2017   ? Carpal tunnel syndrome of left wrist 03/25/2017   ? Ulnar neuropathy at elbow of right upper extremity 03/25/2017   ? Tremors of nervous system 04/03/2014   ? Chronic head pain 04/03/2014   ? ADHD (attention deficit hyperactivity disorder), inattentive type 04/03/2014   ? Selected Cognitive Deficits Secondary To A [facility] 03/10/2013   ? Adjustment disorder with mixed anxiety and depressed mood 03/10/2013   ? Other pain disorders related to psychological factors 03/10/2013   ? Lumbar post-laminectomy syndrome 02/16/2013   ? Lumbar radiculopathy 02/16/2013   ? Limb pain 02/16/2013   ? Burning sensation of feet 06/16/2012   ? Palpitations 09/22/2010   ? Lupus 09/22/2010     Past Medical History:   Diagnosis Date   ? Arthritis    ? Autoimmune disease (HCC)    ? Bowel disease    ? Burning with urination    ? Chronic pain    ? Depression    ? Diarrhea    ? Difficult intubation     difficulty breathing after awakening in past.   ? Dizziness    ? Easy bruising    ? Headache(784.0)    ? Hearing loss    ? Ill-defined condition     past hx of shingles--eye/ear 13 years ago   ? Night sweats     ? Numbness    ? Other ill-defined conditions(799.89)     shingles, lupus, menieres, Fibromyalgia   ? Sweats, menopausal    ? Visual loss      Past Surgical History:   Procedure Laterality Date   ? ABDOMEN SURGERY PROC UNLISTED  1994    1 foot of colon removed   ? HX APPENDECTOMY     ? HX BREAST BIOPSY Left     benign core bx   ? HX CHOLECYSTECTOMY     ? HX CORNEAL TRANSPLANT      right   ? HX GYN      hysterectomy   ? HX HERNIA REPAIR      left   ? HX ORTHOPAEDIC      back x2, right knee   ? HX ORTHOPAEDIC  01-2014     right hand surgery   ? HX TONSILLECTOMY     ? HX TRANSPLANT      right corona   ? HX UROLOGICAL      bladder surgery x2     Prior to Admission medications    Medication Sig Start Date End Date Taking? Authorizing Provider   cetirizine HCl (CETIRIZINE PO) Take 10 mg by mouth every morning.   Yes Historical Provider   topiramate (TOPAMAX  PO) Take 400 mg by mouth nightly.   Yes Historical Provider   nortriptyline (PAMELOR) 50 mg capsule Take 50 mg by mouth nightly.   Yes Historical Provider   melatonin 10 mg cap Take 1 Cap by mouth nightly.   Yes Historical Provider   HYDROcodone-acetaminophen (NORCO) 10-325 mg tablet Take 1 Tab by mouth two (2) times daily as needed for Pain. 12/07/16  Yes Historical Provider   raNITIdine (ZANTAC) 150 mg tablet TAKE 1 TABLET BY MOUTH TWICE A DAY 02/14/17  Yes Historical Provider   gabapentin (NEURONTIN) 300 mg capsule Take 2 Caps by mouth three (3) times daily. 03/04/17  Yes Jenetta Downer, NP   linaclotide Karlene Einstein) 145 mcg cap capsule Take 145 mcg by mouth Daily (before breakfast).   Yes Historical Provider   primidone (MYSOLINE) 50 mg tablet Take 1 Tab by mouth three (3) times daily. 01/20/13  Yes Vincente Liberty, NP   hydroxychloroquine (PLAQUENIL) 200 mg tablet Take 200 mg by mouth two (2) times a day.   Yes Phys Other, MD   meloxicam (MOBIC) 15 mg tablet Take 15 mg by mouth daily.   Yes Phys Other, MD   valacyclovir (VALTREX) 1 g tablet Take 1,000 mg by mouth every  morning. 08/28/09  Yes Phys Other, MD   duloxetine (CYMBALTA) 60 mg capsule Take 60 mg by mouth two (2) times a day. 08/28/09  Yes Phys Other, MD   triamterene-hydrochlorothiazide (DYAZIDE) 37.5-25 mg per capsule Take 1 Cap by mouth daily.   Yes Phys Other, MD   cyclobenzaprine (FLEXERIL) 10 mg tablet Take 10 mg by mouth three (3) times daily as needed for Muscle Spasm(s). 08/28/09  Yes Phys Other, MD   prednisoLONE acetate (PRED FORTE) 1 % ophthalmic suspension Administer 1 Drop to right eye every morning. 08/28/09  Yes Phys Other, MD   flaxseed oil (OMEGA 3 PO) Take 1 Cap by mouth daily.    Historical Provider   prenatal vit 91/iron/folic/dha (PRENATAL + DHA PO) Take 1 Tab by mouth daily.    Historical Provider   POTASSIUM CHLORIDE PO Take 10 mEq by mouth two (2) times a day.    Historical Provider   butalbital-acetaminophen-caffeine (FIORICET, ESGIC) 50-325-40 mg per tablet Take 2 Tabs by mouth as needed for Headache. Take 2 tables at onset of migraine, then take 1 every 4 hrs as needed for migraine 01/28/17   Historical Provider   LORazepam (ATIVAN) 2 mg tablet Take 1 mg by mouth every six (6) hours as needed for Anxiety.    Historical Provider   ergocalciferol (VITAMIN D) 50,000 unit capsule Take 50,000 Units by mouth every seven (7) days. 08/28/09   Phys Other, MD   azelastine (ASTELIN) 137 mcg nasal spray 1 Spray by Nasal route two (2) times a day. Administer to right and left nostril.    Phys Other, MD     Allergies   Allergen Reactions   ? Morphine Other (comments)     "it does not stop the pain"   ? Pcn [Penicillins] Rash and Swelling     Social History   Substance Use Topics   ? Smoking status: Former Smoker     Packs/day: 1.00     Years: 30.00     Quit date: 03/18/2017   ? Smokeless tobacco: Never Used      Comment: off and on   ? Alcohol use No     Family History   Problem Relation Age of Onset   ?  Heart Disease Mother    ? Cancer Mother      breast cancer   ? Breast Cancer Mother 28   ? Hypertension Father    ?  Diabetes Father    ? Heart Disease Sister      Review of Systems  A comprehensive review of systems was negative except for that written in the HPI.    Objective:     Patient Vitals for the past 8 hrs:   BP Temp Pulse Resp SpO2 Weight   05/04/17 1605 122/50 - 74 24 100 % -   05/04/17 1555 111/50 - 76 22 100 % -   05/04/17 1535 113/74 - 74 20 100 % -   05/04/17 1528 105/53 - 71 18 99 % -   05/04/17 1522 108/44 - 66 12 100 % -   05/04/17 1426 118/60 98.5 F (36.9 C) 72 14 99 % -   05/04/17 1412 - - - - - 98.6 kg (217 lb 6 oz)     Right knee tender to rom, nvi, hip nt    Imaging Review  Right knee severe medial compartment osteoarthritis    Assessment:     Active Problems:    Osteoarthritis of right knee (05/04/2017)    Plan:     The various methods of treatment have been discussed with the patient and family.   After consideration of risks, benefits and other options for treatment, the patient has consented to surgical interventions (right total knee arthroplasty with navigation).  Questions were answered and Pre-op teaching was done by staff.       Electronically signed by Janece Canterbury, MD at 05/04/2017  4:18 PM EDT

## 2017-05-04 NOTE — Progress Notes (Signed)
Formatting of this note might be different from the original.  Bedside and Verbal shift change report given to victor, rn ([employer]) by Durwin Reges (offgoing nurse). Report included the following information SBAR, Procedure Summary, Intake/Output, MAR and Recent Results.     Electronically signed by Raynelle Bring B at 05/04/2017 11:14 PM EDT

## 2017-05-04 NOTE — Other (Signed)
Floseal hemostatic agent to sterile field  Lot# [id number] Ref# [id number]

## 2017-05-04 NOTE — Other (Signed)
Handoff Report from Operating Room to PACU    Report received from [place]  and Ok Anis CRNA  regarding Katherine [name].      Surgeon(s):  Marlana Latus, MD  And Procedure(s) (LRB):  RIGHT TOTAL KNEE ARTHROPLASTY WITH NAVIGATION (Right)  confirmed   with allergies and dressings discussed.    Anesthesia type, drugs, patient history, complications, estimated blood loss, vital signs, intake and output, and last pain medication were reviewed.

## 2017-05-04 NOTE — Other (Addendum)
1500:  Patient has been anxious re: surgery.  Verbal order given to administer versed 2 mg by anesthesia.  Nurse placed oxygen via NC at 2 l/m.  Patient became very sleepy & reported she felt better.  Will continue to monitor.    1522:  Dr. Eugenie Birks has been in to see the patient.  She reports she is anxious again.  She was medicated per [facility]    1530:  Patient snoring in bed.  VSS    1541:  OR Nurse at bedside

## 2017-05-04 NOTE — H&P (Signed)
HISTORY AND PHYSICAL  Subjective:     Patient is a 61 y.o. Caucasian female presented with a history of right knee pain.  Onset of symptoms was gradual with gradually worsening course since that time. She is being admitted for surgical management of this condition. The indications for the procedure include right knee severe osteoarthritis unresponsive to conservative treatment.    Patient Active Problem List    Diagnosis Date Noted   ??? Osteoarthritis of right knee 05/04/2017   ??? 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 Lumbar back pain with radiculopathy affecting left lower extremity 03/25/2017   ??? Lumbar back pain with radiculopathy affecting right lower extremity 03/25/2017   ??? Cervical radiculopathy due to degenerative joint disease of spine 03/25/2017   ??? Carpal tunnel syndrome of left wrist 03/25/2017   ??? Ulnar neuropathy at elbow of right upper extremity 03/25/2017   ??? Tremors of nervous system 04/03/2014   ??? Chronic head pain 04/03/2014   ??? ADHD (attention deficit hyperactivity disorder), inattentive type 04/03/2014   ??? Selected Cognitive Deficits Secondary To A [facility] 03/10/2013   ??? Adjustment disorder with mixed anxiety and depressed mood 03/10/2013   ??? 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 Lumbar post-laminectomy syndrome 02/16/2013   ??? Lumbar radiculopathy 02/16/2013   ??? Limb pain 02/16/2013   ??? Burning sensation of feet 06/16/2012   ??? Palpitations 09/22/2010   ??? Lupus 09/22/2010     Past Medical History:   Diagnosis Date   ??? Arthritis    ??? Autoimmune disease ([place])    ??? Bowel disease    ??? Burning with urination    ??? Chronic pain    ??? Depression    ??? Diarrhea    ??? Difficult intubation     difficulty breathing after awakening in past.   ??? Dizziness    ??? Easy bruising    ??? Headache(784.0)    ??? Hearing loss    ??? Ill-defined condition     past hx of shingles--eye/ear 13 years ago   ??? Night sweats    ??? Numbness    ??? Other ill-defined conditions(799.89)      shingles, lupus, menieres, Fibromyalgia   ??? Sweats, menopausal    ??? Visual loss       Past Surgical History:   Procedure Laterality Date   ??? ABDOMEN SURGERY PROC UNLISTED  1994    1 foot of colon removed   ??? HX APPENDECTOMY     ??? HX BREAST BIOPSY Left     benign core bx   ??? HX CHOLECYSTECTOMY     ??? HX CORNEAL TRANSPLANT      right   ??? HX GYN      hysterectomy   ??? HX HERNIA REPAIR      left   ??? HX ORTHOPAEDIC      back x2, right knee   ??? HX ORTHOPAEDIC  01-2014     right hand surgery   ??? HX TONSILLECTOMY     ??? HX TRANSPLANT      right corona   ??? HX UROLOGICAL      bladder surgery x2      Prior to Admission medications    Medication Sig Start Date End Date Taking? Authorizing Provider   cetirizine HCl (CETIRIZINE PO) Take 10 mg by mouth every morning.   Yes Historical Provider   topiramate (TOPAMAX PO) Take 400 mg by mouth nightly.   Yes  Historical Provider   nortriptyline (PAMELOR) 50 mg capsule Take 50 mg by mouth nightly.   Yes Historical Provider   melatonin 10 mg cap Take 1 Cap by mouth nightly.   Yes Historical Provider   HYDROcodone-acetaminophen (NORCO) 10-325 mg tablet Take 1 Tab by mouth two (2) times daily as needed for Pain. 12/07/16  Yes Historical Provider   raNITIdine (ZANTAC) 150 mg tablet TAKE 1 TABLET BY MOUTH TWICE A DAY 02/14/17  Yes Historical Provider   gabapentin (NEURONTIN) 300 mg capsule Take 2 Caps by mouth three (3) times daily. 03/04/17  Yes Shearon Stalls, NP   linaclotide Rolan Lipa) 145 mcg cap capsule Take 145 mcg by mouth Daily (before breakfast).   Yes Historical Provider   primidone (MYSOLINE) 50 mg tablet Take 1 Tab by mouth three (3) times daily. 01/20/13  Yes Weyman Croon, NP   hydroxychloroquine (PLAQUENIL) 200 mg tablet Take 200 mg by mouth two (2) times a day.   Yes Phys Other, MD   meloxicam (MOBIC) 15 mg tablet Take 15 mg by mouth daily.   Yes Phys Other, MD   valacyclovir (VALTREX) 1 g tablet Take 1,000 mg by mouth every morning. 08/28/09  Yes Phys Other, MD    duloxetine (CYMBALTA) 60 mg capsule Take 60 mg by mouth two (2) times a day. 08/28/09  Yes Phys Other, MD   triamterene-hydrochlorothiazide (DYAZIDE) 37.5-25 mg per capsule Take 1 Cap by mouth daily.   Yes Phys Other, MD   cyclobenzaprine (FLEXERIL) 10 mg tablet Take 10 mg by mouth three (3) times daily as needed for Muscle Spasm(s). 08/28/09  Yes Phys Other, MD   prednisoLONE acetate (PRED FORTE) 1 % ophthalmic suspension Administer 1 Drop to right eye every morning. 08/28/09  Yes Phys Other, MD   flaxseed oil (OMEGA 3 PO) Take 1 Cap by mouth daily.    Historical Provider   prenatal vit 91/iron/folic/dha (PRENATAL + DHA PO) Take 1 Tab by mouth daily.    Historical Provider   POTASSIUM CHLORIDE PO Take 10 mEq by mouth two (2) times a day.    Historical Provider   butalbital-acetaminophen-caffeine (FIORICET, ESGIC) 50-325-40 mg per tablet Take 2 Tabs by mouth as needed for Headache. Take 2 tables at onset of migraine, then take 1 every 4 hrs as needed for migraine 01/28/17   Historical Provider   LORazepam (ATIVAN) 2 mg tablet Take 1 mg by mouth every six (6) hours as needed for Anxiety.    Historical Provider   ergocalciferol (VITAMIN D) 50,000 unit capsule Take 50,000 Units by mouth every seven (7) days. 08/28/09   Phys Other, MD   azelastine (ASTELIN) 137 mcg nasal spray 1 Spray by Nasal route two (2) times a day. Administer to right and left nostril.    Phys Other, MD     Allergies   Allergen Reactions   ??? Morphine Other (comments)     "it does not stop the pain"   ??? Pcn [Penicillins] Rash and Swelling      Social History   Substance Use Topics   ??? Smoking status: Former Smoker     Packs/day: 1.00     Years: 30.00     Quit date: 03/18/2017   ??? Smokeless tobacco: Never Used      Comment: off and on   ??? Alcohol use No      Family History   Problem Relation Age of Onset   ??? Heart Disease Mother    ??? Cancer  Mother      breast cancer   ??? Breast Cancer Mother 84   ??? Hypertension Father    ??? Diabetes Father     ??? Heart Disease Sister       Review of Systems  A comprehensive review of systems was negative except for that written in the HPI.    Objective:     Patient Vitals for the past 8 hrs:   BP Temp Pulse Resp SpO2 Weight   05/04/17 1605 122/50 - 74 24 100 % -   05/04/17 1555 111/50 - 76 22 100 % -   05/04/17 1535 113/74 - 74 20 100 % -   05/04/17 1528 105/53 - 71 18 99 % -   05/04/17 1522 108/44 - 66 12 100 % -   05/04/17 1426 118/60 98.5 ??F (36.9 ??C) 72 14 99 % -   05/04/17 1412 - - - - - 98.6 kg (217 lb 6 oz)     Right knee tender to rom, nvi, hip nt    Imaging Review  Right knee severe medial compartment osteoarthritis    Assessment:     Active Problems:    Osteoarthritis of right knee (05/04/2017)        Plan:     The various methods of treatment have been discussed with the patient and family.   After consideration of risks, benefits and other options for treatment, the patient has consented to surgical interventions (right total knee arthroplasty with navigation).  Questions were answered and Pre-op teaching was done by staff.

## 2017-05-04 NOTE — Anesthesia Post-Procedure Evaluation (Signed)
Post-Anesthesia Evaluation and Assessment    Patient: Katherine [name] MRN: [id number]  SSN: [id number]    Date of Birth: [birth year]  Age: 61 y.o.  Sex: female       Cardiovascular Function/Vital Signs  Visit Vitals   ??? BP 98/49   ??? Pulse 68   ??? Temp 36.8 ??C (98.2 ??F)   ??? Resp 18   ??? Wt 98.6 kg (217 lb 6 oz)   ??? SpO2 99%   ??? BMI 34.05 kg/m2       Patient is status post general anesthesia for Procedure(s):  RIGHT TOTAL KNEE ARTHROPLASTY WITH NAVIGATION.    Nausea/Vomiting: None    Postoperative hydration reviewed and adequate.    Pain:  Pain Scale 1: Numeric (0 - 10) (05/04/17 1843)  Pain Intensity 1: 4 (05/04/17 1843)   Managed    Neurological Status:   Neuro (WDL): Within Defined Limits (05/04/17 1830)  Neuro  Neurologic State: Alert (05/04/17 1446)  Orientation Level: Oriented X4 (05/04/17 1446)   At baseline    Mental Status and Level of Consciousness: Arousable    Pulmonary Status:   O2 Device: Nasal cannula (05/04/17 1803)   Adequate oxygenation and airway patent    Complications related to anesthesia: None    Post-anesthesia assessment completed. No concerns    Signed By: Nicoletta Ba, MD     May 04, 2017

## 2017-05-04 NOTE — Other (Signed)
TRANSFER - OUT REPORT:    Verbal report given to Franco Nones RN (name) on Katherine [name]  being transferred to 3219(unit) for routine post - op       Report consisted of patient???s Situation, Background, Assessment and   Recommendations(SBAR).     Information from the following report(s) SBAR, Kardex, OR Summary, Intake/Output and MAR was reviewed with the receiving nurse.    Opportunity for questions and clarification was provided.      Patient transported with:   O2 @ 2 liters  Registered Nurse  Tech   Pt family updated of pt status and bed assignment

## 2017-05-04 NOTE — Progress Notes (Signed)
Problem: Falls - Risk of  Goal: *Absence of Falls  Document Schmid Fall Risk and appropriate interventions in the flowsheet.   Outcome: Progressing Towards Goal  Fall Risk Interventions:

## 2017-05-04 NOTE — Progress Notes (Signed)
Bedside and Verbal shift change report given to victor, rn (Soil scientist) by Magdalen Spatz (offgoing nurse). Report included the following information SBAR, Procedure Summary, Intake/Output, MAR and Recent Results.

## 2017-05-04 NOTE — Progress Notes (Signed)
Ortho/ NeuroSurgery NP Note    POD# 0  s/p RIGHT TOTAL KNEE ARTHROPLASTY WITH NAVIGATION (surgical pain solution)   This note serves as a record of a chart review.    Most Recent Labs:   Lab Results   Component Value Date/Time    HGB 13.6 04/20/2017 09:16 AM    Hemoglobin A1c 5.0 04/20/2017 09:16 AM       Body mass index is 34.05 kg/(m^2). BMI greater than 30 is classified as obesity.     STOP BANG Score: 1    Plan:  1) PT BID starting tomorrow.  2) Peri-op Antibiotics Vancomycin and Levaquin  3) Aspirin 325 mg PO BID for DVT Prophylaxis   4) Discharge planning will begin on POD #1.     Admission Status Determination: Patient with frequent falls, neuropathic pain in multiple regions of her body, Lupus, Fibromyalgia, Meniere's, and questionable sleep apnea history.      Lennox Laity, NP

## 2017-05-04 NOTE — Other (Signed)
Over to preop to interview patient. Patient A&O x4 with no distress noted. Patient, procedure, site, and allergies verified via patient and consent. All questions answered.   Pt to OR via stretcher, moved self to OR bed with standby /safety assistance from staff. Final patient position approved by Dr. Eugenie Birks

## 2017-05-04 NOTE — Anesthesia Pre-Procedure Evaluation (Signed)
Anesthetic History        Pertinent negatives: No increased risk of difficult airway (pt described difficulty breathing after a general anesthetic, but denies any knowledge of difficult intubation.)       Review of Systems / Medical History  Patient summary reviewed, nursing notes reviewed and pertinent labs reviewed    Pulmonary          Smoker         Neuro/Psych         Psychiatric history     Cardiovascular  Within defined limits                Exercise tolerance: >4 METS     GI/Hepatic/Renal  Within defined limits              Endo/Other        Obesity and arthritis     Other Findings   Comments: shingles, lupus, menieres, Fibromyalgia  Burning sensation of feet    Unspecified hereditary and idiopathic peripheral neuropathy   Lumbar post-laminectomy syndrome   Lumbar radiculopathy   Limb pain   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   Adjustment disorder with mixed anxiety and depressed mood   Other pain disorders related to psychological factors   Tremors of nervous system   Chronic head pain   ADHD (attention deficit hyperactivity disorder), inattentive type    Has h/o feeling SOB post-op           Physical Exam    Airway  Mallampati: II  TM Distance: 4 - 6 cm  Neck ROM: normal range of motion   Mouth opening: Normal     Cardiovascular          Murmur: Grade 1     Dental  No notable dental hx       Pulmonary  Breath sounds clear to auscultation               Abdominal  GI exam deferred       Other Findings            Anesthetic Plan    ASA: 3  Anesthesia type: general    Monitoring Plan: BIS      Induction: Intravenous  Anesthetic plan and risks discussed with: Patient

## 2017-05-05 LAB — GLUCOSE, POC: Glucose (POC): 83 mg/dL (ref 65–100)

## 2017-05-05 LAB — METABOLIC PANEL, BASIC
Anion gap: 7 mmol/L (ref 5–15)
BUN/Creatinine ratio: 22 — ABNORMAL HIGH (ref 12–20)
BUN: 12 MG/DL (ref 6–20)
CO2: 25 mmol/L (ref 21–32)
Calcium: 8.3 MG/DL — ABNORMAL LOW (ref 8.5–10.1)
Chloride: 105 mmol/L (ref 97–108)
Creatinine: 0.55 MG/DL (ref 0.55–1.02)
GFR est AA: >60 mL/min/{1.73_m2} (ref >60)
GFR est non-AA: >60 mL/min/{1.73_m2} (ref >60)
Glucose: 102 mg/dL — ABNORMAL HIGH (ref 65–100)
Potassium: 4.3 mmol/L (ref 3.5–5.1)
Sodium: 137 mmol/L (ref 136–145)

## 2017-05-05 LAB — HEMOGLOBIN: HGB: 11.6 g/dL (ref 11.5–16.0)

## 2017-05-05 MED ORDER — ASPIRIN 325 MG TAB, DELAYED RELEASE
325 mg | ORAL_TABLET | Freq: Two times a day (BID) | ORAL | 0 refills | Status: AC
Start: 2017-05-05 — End: 2017-06-04

## 2017-05-05 MED ORDER — FLUCONAZOLE 100 MG TAB
100 mg | ORAL | Status: AC
Start: 2017-05-05 — End: 2017-05-05
  Administered 2017-05-05: 23:00:00 via ORAL

## 2017-05-05 MED ORDER — FAMOTIDINE 20 MG TAB
20 mg | ORAL_TABLET | Freq: Two times a day (BID) | ORAL | 0 refills | Status: AC
Start: 2017-05-05 — End: 2017-06-04

## 2017-05-05 MED ORDER — ACETAMINOPHEN 325 MG TABLET
325 mg | ORAL_TABLET | Freq: Four times a day (QID) | ORAL | 0 refills | Status: AC
Start: 2017-05-05 — End: 2017-05-19

## 2017-05-05 MED ORDER — NALOXONE 4 MG/ACTUATION NASAL SPRAY
4 mg/actuation | NASAL | 0 refills | Status: DC | PRN
Start: 2017-05-05 — End: 2018-09-14

## 2017-05-05 MED ORDER — POLYETHYLENE GLYCOL 3350 17 GRAM (100 %) ORAL POWDER PACKET
17 gram | PACK | Freq: Every day | ORAL | 0 refills | Status: AC | PRN
Start: 2017-05-05 — End: 2017-05-20

## 2017-05-05 MED ORDER — OXYCODONE 5 MG TAB
5 mg | ORAL_TABLET | ORAL | 0 refills | Status: DC | PRN
Start: 2017-05-05 — End: 2018-03-01

## 2017-05-05 MED ORDER — TOPIRAMATE 100 MG TAB
100 mg | Freq: Two times a day (BID) | ORAL | Status: DC
Start: 2017-05-05 — End: 2017-05-06
  Administered 2017-05-05 – 2017-05-06 (×3): via ORAL

## 2017-05-05 MED ORDER — LORAZEPAM 2 MG TAB
2 mg | ORAL_TABLET | Freq: Four times a day (QID) | ORAL | 0 refills | Status: DC | PRN
Start: 2017-05-05 — End: 2018-03-01

## 2017-05-05 MED ORDER — SENNOSIDES-DOCUSATE SODIUM 8.6 MG-50 MG TAB
ORAL_TABLET | Freq: Every day | ORAL | 0 refills | Status: DC
Start: 2017-05-05 — End: 2018-03-01

## 2017-05-05 MED FILL — MAPAP (ACETAMINOPHEN) 325 MG TABLET: 325 mg | ORAL | Qty: 2

## 2017-05-05 MED FILL — OXYCODONE 5 MG TAB: 5 mg | ORAL | Qty: 2

## 2017-05-05 MED FILL — PREDNISOLONE ACETATE 1 % EYE DROPS, SUSP: 1 % | OPHTHALMIC | Qty: 5

## 2017-05-05 MED FILL — FAMOTIDINE 20 MG TAB: 20 mg | ORAL | Qty: 1

## 2017-05-05 MED FILL — TRANEXAMIC ACID 1,000 MG/10 ML (100 MG/ML) IV: 1000 mg/10 mL (100 mg/mL) | INTRAVENOUS | Qty: 10

## 2017-05-05 MED FILL — VALTREX 500 MG TABLET: 500 mg | ORAL | Qty: 2

## 2017-05-05 MED FILL — AZELASTINE 137 MCG NASAL SPRAY AEROSOL: 137 mcg (0.1 %) | NASAL | Qty: 1

## 2017-05-05 MED FILL — DIPRIVAN 10 MG/ML INTRAVENOUS EMULSION: 10 mg/mL | INTRAVENOUS | Qty: 130

## 2017-05-05 MED FILL — KETOROLAC TROMETHAMINE 30 MG/ML INJECTION: 30 mg/mL (1 mL) | INTRAMUSCULAR | Qty: 1

## 2017-05-05 MED FILL — PRIMIDONE 50 MG TAB: 50 mg | ORAL | Qty: 1

## 2017-05-05 MED FILL — FLUCONAZOLE 100 MG TAB: 100 mg | ORAL | Qty: 2

## 2017-05-05 MED FILL — GABAPENTIN 300 MG CAP: 300 mg | ORAL | Qty: 2

## 2017-05-05 MED FILL — PHENYLEPHRINE IN 0.9 % SODIUM CL (40 MCG/ML) IV SYRINGE: 0.4 mg/10 mL (40 mcg/mL) | INTRAVENOUS | Qty: 160

## 2017-05-05 MED FILL — NORTRIPTYLINE 25 MG CAP: 25 mg | ORAL | Qty: 2

## 2017-05-05 MED FILL — DULOXETINE 30 MG CAP, DELAYED RELEASE: 30 mg | ORAL | Qty: 2

## 2017-05-05 MED FILL — ROCURONIUM 10 MG/ML IV: 10 mg/mL | INTRAVENOUS | Qty: 5

## 2017-05-05 MED FILL — TOPIRAMATE 100 MG TAB: 100 mg | ORAL | Qty: 2

## 2017-05-05 MED FILL — BD POSIFLUSH NORMAL SALINE 0.9 % INJECTION SYRINGE: INTRAMUSCULAR | Qty: 10

## 2017-05-05 MED FILL — AKOVAZ 50 MG/ML INTRAVENOUS SOLUTION: 50 mg/mL | INTRAVENOUS | Qty: 1

## 2017-05-05 MED FILL — ASPIRIN 325 MG TAB, DELAYED RELEASE: 325 mg | ORAL | Qty: 1

## 2017-05-05 MED FILL — ONDANSETRON (PF) 4 MG/2 ML INJECTION: 4 mg/2 mL | INTRAMUSCULAR | Qty: 2

## 2017-05-05 MED FILL — BD POSIFLUSH NORMAL SALINE 0.9 % INJECTION SYRINGE: INTRAMUSCULAR | Qty: 20

## 2017-05-05 MED FILL — DOK PLUS 8.6 MG-50 MG TABLET: ORAL | Qty: 1

## 2017-05-05 MED FILL — DEXAMETHASONE SODIUM PHOSPHATE 4 MG/ML IJ SOLN: 4 mg/mL | INTRAMUSCULAR | Qty: 5

## 2017-05-05 MED FILL — HEALTHYLAX 17 GRAM ORAL POWDER PACKET: 17 gram | ORAL | Qty: 1

## 2017-05-05 MED FILL — OXYCODONE 5 MG TAB: 5 mg | ORAL | Qty: 1

## 2017-05-05 MED FILL — VANCOMYCIN IN 0.9% SODIUM CHLORIDE 1.5 G/500 ML IV: 1.5 g/500 mL | INTRAVENOUS | Qty: 500

## 2017-05-05 MED FILL — HYDROMORPHONE (PF) 2 MG/ML IJ SOLN: 2 mg/mL | INTRAMUSCULAR | Qty: 1

## 2017-05-05 MED FILL — LIDOCAINE (PF) 20 MG/ML (2 %) IJ SOLN: 20 mg/mL (2 %) | INTRAMUSCULAR | Qty: 5

## 2017-05-05 NOTE — Progress Notes (Signed)
Formatting of this note is different from the original.  Problem: Mobility Impaired (Adult and Pediatric)  Goal: *Acute Goals and Plan of Care (Insert Text)  Physical Therapy Goals  Initiated 05/05/2017    1. Patient will move from supine to sit and sit to supine  in bed with independence within 4 days.  2. Patient will perform sit to stand with independence within 4 days.  3. Patient will ambulate with modified independence for 300 feet with the least restrictive device within 4 days.  4. Patient will ascend/descend 4 stairs with 1 handrail(s) with modified independence within 4 days.  5. Patient will perform home exercise program per protocol with independence within 4 days.  6. Patient will demonstrate AROM 0-90 degrees in operative joint within 4 days.    physical Therapy EVALUATION  Patient: Katherine [name] (61 y.o. female)  Date: 05/05/2017  Primary Diagnosis: OSTEOARTHRITIS  RIGHT KNEE   Osteoarthritis of right knee  Procedure(s) (LRB):  RIGHT TOTAL KNEE ARTHROPLASTY WITH NAVIGATION (Right) 1 Day Post-Op   Precautions:   Fall, WBAT    ASSESSMENT :  Based on the objective data described below, the patient presents with impaired balance, high fall risk, post-op knee pain, impaired RLE sensation and decreased ROM and strength limiting functional mobility following R TKA POD#1. Pt with baseline balance deficits d/t Meniere's with reported near daily falls. Pt was given RW at pre-op joint class and endorses not falls but many close calls since. Today pt performed supine HEP with Min A, good quad contraction noted. Overall Min-Mod A for OOB mobility with most difficulty noted during sit>stand transfers d/t weakness and difficulty transfers hands to RW. Pt ambulated ~28ft in the hallway prior to reporting dizziness and becoming tremulous. Cues for deep breathing and relaxation improved symptoms and pt was able to make it back to the room with chair follow. Step to gait displayed throughout. Min A for toileting prior  to leaving up in chair with all VSS. Noted increased swelling in medial compartment of knee. Plan for discharge to SNF when stable to improve balance, strength and activity tolerance.    Patient will benefit from skilled intervention to address the above impairments.  Patient?s rehabilitation potential is considered to be Good  Factors which may influence rehabilitation potential include:   []          None noted  []          Mental ability/status  [x]          Medical condition  []          Home/family situation and support systems  []          Safety awareness  []          Pain tolerance/management  []          Other:      PLAN :  Recommendations and Planned Interventions:  [x]            Bed Mobility Training             [x]     Neuromuscular Re-Education  [x]            Transfer Training                   []     Orthotic/Prosthetic Training  [x]            Gait Training                         []   Modalities  [x]            Therapeutic Exercises           []     Edema Management/Control  [x]            Therapeutic Activities            [x]     Patient and Family Training/Education  []            Other (comment):    Frequency/Duration: Patient will be followed by physical therapy  twice daily to address goals.  Discharge Recommendations: Skilled Nursing Facility  Further Equipment Recommendations for Discharge: TBD at rehab     SUBJECTIVE:   Patient stated ?The numbness was not this bad beforehand.?    OBJECTIVE DATA SUMMARY:   HISTORY:    Past Medical History:   Diagnosis Date   ? Arthritis    ? Autoimmune disease (HCC)    ? Bowel disease    ? Burning with urination    ? Chronic pain    ? Depression    ? Diarrhea    ? Difficult intubation     difficulty breathing after awakening in past.   ? Dizziness    ? Easy bruising    ? Headache(784.0)    ? Hearing loss    ? Ill-defined condition     past hx of shingles--eye/ear 13 years ago   ? Night sweats    ? Numbness    ? Other ill-defined conditions(799.89)     shingles, lupus,  menieres, Fibromyalgia   ? Sweats, menopausal    ? Visual loss      Past Surgical History:   Procedure Laterality Date   ? ABDOMEN SURGERY PROC UNLISTED  1994    1 foot of colon removed   ? HX APPENDECTOMY     ? HX BREAST BIOPSY Left     benign core bx   ? HX CHOLECYSTECTOMY     ? HX CORNEAL TRANSPLANT      right   ? HX GYN      hysterectomy   ? HX HERNIA REPAIR      left   ? HX ORTHOPAEDIC      back x2, right knee   ? HX ORTHOPAEDIC  01-2014     right hand surgery   ? HX TONSILLECTOMY     ? HX TRANSPLANT      right corona   ? HX UROLOGICAL      bladder surgery x2     Prior Level of Function/Home Situation: Lives alone. Multiple falls d/t balance deficits and knee pain. Pt has been using a RW more recently and was receiving HHPT  Personal factors and/or comorbidities impacting plan of care: meniere's, falls    Home Situation  Home Environment: Private residence  # Steps to Enter: 4  Rails to Enter: Yes  Hand Rails : Bilateral  One/Two Story Residence: One story  Living Alone: Yes  Support Systems: Family member(s)  Patient Expects to be Discharged to:: Private residence  Current DME Used/Available at Home: Environmental consultant, rolling    EXAMINATION/PRESENTATION/DECISION MAKING:   Critical Behavior:  Neurologic State: Alert  Orientation Level: Oriented X4      Hearing:  Auditory  Auditory Impairment: Hard of hearing, left side  Hearing Aids/Status: At home, Left  Skin:    Edema:   Range Of Motion:  AROM: Generally decreased, functional        PROM: Generally decreased, functional  Strength:    Strength: Generally decreased, functional              Tone & Sensation:   Tone: Normal          Sensation: Impaired (RLE below knee numbness/tingling)          Coordination:  Coordination: Generally decreased, functional  Vision:     Functional Mobility:  Bed Mobility:  Rolling: Contact guard assistance  Supine to Sit: Contact guard assistance;Additional time      Transfers:  Sit to Stand: Minimum assistance;Moderate  assistance;Additional time;Assist x1 (multiple trials)  Stand to Sit: Minimum assistance            Balance:   Sitting: Intact  Standing: Impaired  Standing - Static: Fair  Standing - Dynamic : Fair  Ambulation/Gait Training:  Distance (ft): 75 Feet (ft)  Assistive Device: Gait belt;Walker, rolling  Ambulation - Level of Assistance: Minimal assistance      Gait Abnormalities: Altered arm swing;Decreased step clearance;Step to gait      Base of Support: Narrowed;Center of gravity altered  Stance: Right decreased  Speed/Cadence: Pace decreased (<100 feet/min);Slow;Shuffled  Step Length: Left shortened;Right shortened               Stairs:          Therapeutic Exercises:   Ankle pumps, quad sets, glute sets, heel slides    Functional Measure:  Tinetti test:    Sitting Balance: 1  Arises: 0  Attempts to Rise: 0  Immediate Standing Balance: 1  Standing Balance: 1  Nudged: 0  Eyes Closed: 0  Turn 360 Degrees - Continuous/Discontinuous: 0  Turn 360 Degrees - Steady/Unsteady: 0  Sitting Down: 1  Balance Score: 4  Indication of Gait: 1  R Step Length/Height: 0  L Step Length/Height: 0  R Foot Clearance: 1  L Foot Clearance: 1  Step Symmetry: 1  Step Continuity: 0  Path: 0  Trunk: 0  Walking Time: 0  Gait Score: 4  Total Score: 8      Tinetti Test and G-code impairment scale:  Percentage of Impairment CH    0%   CI    1-19% CJ    20-39% CK    40-59% CL    60-79% CM    80-99% CN     100%   Tinetti  Score 0-28 28 23-27 17-22 12-16 6-11 1-5 0     Tinetti Tool Score Risk of Falls  <19 = High Fall Risk  19-24 = Moderate Fall Risk  25-28 = Low Fall Risk  Tinetti ME. Performance-Oriented Assessment of Mobility Problems in Elderly Patients. JAGS 1986; [id number]. (Scoring Description: PT Bulletin Feb. 10, 1993)    Older adults: Lonn Georgia et al, 2009; n = 1000 [place] elderly evaluated with ABC, POMA, ADL, and IADL)   Mean POMA score for males aged 65-79 years = 26.21(3.40)   Mean POMA score for females age 81-79 years = 25.16(4.30)    Mean POMA score for males over 80 years = 23.29(6.02)   Mean POMA score for females over 80 years = 17.20(8.32)     G codes:  In compliance with CMS?s Claims Based Outcome Reporting, the following G-code set was chosen for this patient based on their primary functional limitation being treated:    The outcome measure chosen to determine the severity of the functional limitation was the Tinetti with a score of 8/28 which was correlated with the impairment scale.    ? Mobility -  Walking and Moving Around:    [phone] - CURRENT STATUS: CL - 60%-79% impaired, limited or restricted   [id number] - GOAL STATUS: CK - 40%-59% impaired, limited or restricted   V9563 - D/C STATUS:  ---------------To be determined---------------     Physical Therapy Evaluation Charge Determination   History Examination Presentation Decision-Making   MEDIUM  Complexity : 1-2 comorbidities / personal factors will impact the outcome/ POC  MEDIUM Complexity : 3 Standardized tests and measures addressing body structure, function, activity limitation and / or participation in recreation  LOW Complexity : Stable, uncomplicated  Other outcome measures Tinetti  LOW      Based on the above components, the patient evaluation is determined to be of the following complexity level: LOW     Pain:  Pain Scale 1: Numeric (0 - 10)  Pain Intensity 1: 5  Pain Location 1: Knee  Pain Orientation 1: Right    Pain Intervention(s) 1: Medication (see MAR)  Activity Tolerance:   Good  Please refer to the flowsheet for vital signs taken during this treatment.  After treatment:   [x]          Patient left in no apparent distress sitting up in chair  []          Patient left in no apparent distress in bed  [x]          Call bell left within reach  [x]          Nursing notified  []          Caregiver present  []          Bed alarm activated    COMMUNICATION/EDUCATION:   The patient?s plan of care was discussed with: Registered Nurse.  [x]          Fall prevention education was provided  and the patient/caregiver indicated understanding.  [x]          Patient/family have participated as able in goal setting and plan of care.  [x]          Patient/family agree to work toward stated goals and plan of care.  []          Patient understands intent and goals of therapy, but is neutral about his/her participation.  []          Patient is unable to participate in goal setting and plan of care.    Thank you for this referral.  Rhea Pink, PT   Time Calculation: 33 mins      Electronically signed by Rhea Pink, PT at 05/05/2017 12:24 PM EDT

## 2017-05-05 NOTE — Progress Notes (Signed)
Formatting of this note is different from the original.  Problem: Mobility Impaired (Adult and Pediatric)  Goal: *Acute Goals and Plan of Care (Insert Text)  Physical Therapy Goals  Initiated 05/05/2017    1. Patient will move from supine to sit and sit to supine  in bed with independence within 4 days.  2. Patient will perform sit to stand with independence within 4 days.  3. Patient will ambulate with modified independence for 300 feet with the least restrictive device within 4 days.  4. Patient will ascend/descend 4 stairs with 1 handrail(s) with modified independence within 4 days.  5. Patient will perform home exercise program per protocol with independence within 4 days.  6. Patient will demonstrate AROM 0-90 degrees in operative joint within 4 days.    physical Therapy TREATMENT  Patient: Katherine [name] (61 y.o. female)  Date: 05/05/2017  Diagnosis: OSTEOARTHRITIS  RIGHT KNEE   Osteoarthritis of right knee S/P total knee replacement, right  Procedure(s) (LRB):  RIGHT TOTAL KNEE ARTHROPLASTY WITH NAVIGATION (Right) 1 Day Post-Op  Precautions: Fall, WBAT  Chart, physical therapy assessment, plan of care and goals were reviewed.    ASSESSMENT:  Pt cleared by RN and received supine in bed with family members present. Tolerated PM session well with improvements in balance, strength and pain tolerance. ROM more limited this afternoon in both flexion and extension. Encouraged continued work on exercises throughout the day. Pt CGA with transfers and gait training with RW. With assist to advance RW forward pt demonstrated increased gait speed and step length. Pt requiring near constant cueing to maintain forward progression and quicker turnover. One episode of buckling with pt was able to self correct. Returned to chair at end of session with all needs within reach. Continues to c/o increased pain to the touch at medial knee +swelling +redness(ice was on prior). Plan for discharge to SNF remains appropriate.    Progression toward goals:  [x]       Improving appropriately and progressing toward goals  []       Improving slowly and progressing toward goals  []       Not making progress toward goals and plan of care will be adjusted     PLAN:  Patient continues to benefit from skilled intervention to address the above impairments.  Continue treatment per established plan of care.  Discharge Recommendations:  Skilled Nursing Facility  Further Equipment Recommendations for Discharge:  TBD at rehab     SUBJECTIVE:   Don't squeeze it this time that really hurt    OBJECTIVE DATA SUMMARY:   Range of Motion:  AROM: Generally decreased, functional  PROM: Generally decreased, functional              Functional Mobility Training:  Bed Mobility:  Rolling: Contact guard assistance  Supine to Sit: Contact guard assistance          Transfers:  Sit to Stand: Contact guard assistance  Stand to Sit: Contact guard assistance                Ambulation/Gait Training:  Distance (ft): 130 Feet (ft)  Assistive Device: Gait belt;Walker, rolling  Ambulation - Level of Assistance: Contact guard assistance;Assist x1;Additional time      Gait Abnormalities: Early heel rise;Decreased step clearance;Antalgic      Base of Support: Widened  Stance: Right decreased  Speed/Cadence: Pace decreased (<100 feet/min);Slow  Step Length: Right shortened;Left shortened  Stairs:        Therapeutic Exercises:   Exercises performed per protocol.  See morning treatment note for description.  Pain:  Pain Scale 1: Numeric (0 - 10)  Pain Intensity 1: 5  Pain Location 1: Knee  Pain Orientation 1: Right    Pain Intervention(s) 1: Medication (see MAR)  Activity Tolerance:   Good  Please refer to the flowsheet for vital signs taken during this treatment.  After treatment:   [x]  Patient left in no apparent distress sitting up in chair  []  Patient left in no apparent distress in bed  [x]  Call bell left within reach  [x]  Nursing notified  []  Caregiver present  []  Bed  alarm activated    COMMUNICATION/COLLABORATION:   The patient?s plan of care was discussed with: Registered Nurse    Rhea Pink, PT   Time Calculation: 24 mins      Electronically signed by Rhea Pink, PT at 05/05/2017  4:00 PM EDT

## 2017-05-05 NOTE — Progress Notes (Signed)
Formatting of this note might be different from the original.  Pt has been accepted by [facility].     CM was advised pt has ortho bundle and will discharge 09.12;     CM advised pt of acceptance.     Care Management Interventions  PCP Verified by CM: Yes  Mode of Transport at Discharge: Other (see comment)  Transition of Care Consult (CM Consult): SNF  Partner SNF: Yes  Discharge Durable Medical Equipment: No  Health Maintenance Reviewed: Yes  Physical Therapy Consult: Yes  Occupational Therapy Consult: No  Speech Therapy Consult: No  Current Support Network: Own Home, Lives Alone  Confirm Follow Up Transport: Family  Plan discussed with Pt/Family/Caregiver: Yes  Freedom of Choice Offered: Yes  Discharge Location  Discharge Placement: Skilled nursing facility    [place], MSW, CM   [facility] Manager   [phone]    Electronically signed by Stephannie Li S at 05/05/2017  2:13 PM EDT

## 2017-05-05 NOTE — Progress Notes (Signed)
Formatting of this note might be different from the original.  Reason for Admission:  Osteoarthritis right knee;         RRAT Score: 8              Plan for utilizing home health:  Prefers SNF placement with [facility]    Likelihood of Readmission:  LOW   Transition of Care Plan:                      Pt is a 61 y/o Caucasian female admitted for Osteoarthritis right knee; Pt lives alone in a one story home with no steps to the entrance. Pt is independent with ADLs and rarely drives at baseline. Pt has no previous HH or SNF provider. Pt has access to hospital issued RW, rollator, shower bench and cane. Pt prefers to use [facility] at the intersection of [place] [address] and [address]. Pt will be transported by family via private vehicle.     CM met with pt to complete initial assessment. Pt is alert and oriented to person, place,time and situation. CM will continue to follow pt to assist with discharge plans as needed.     CM spoke with pt regarding dispo and she chose [facility] for post acute care. CM sent referral to [facility] via [facility]. CM placed FOC on pt bedside chart. Pt is an Ortho Bundle and will not require three midnight stay for discharge.     Care Management Interventions  PCP Verified by CM: Yes  Mode of Transport at Discharge: Other (see comment)  Transition of Care Consult (CM Consult): SNF  Partner SNF: Yes  Discharge Durable Medical Equipment: No  Health Maintenance Reviewed: Yes  Physical Therapy Consult: Yes  Occupational Therapy Consult: No  Speech Therapy Consult: No  Current Support Network: Own Home, Lives Alone  Confirm Follow Up Transport: Family  Plan discussed with Pt/Family/Caregiver: Yes  Freedom of Choice Offered: Yes  Discharge Location  Discharge Placement: Skilled nursing facility    [place], MSW, CM  [place] [facility] Manager   [phone]    Electronically signed by Stephannie Li S at 05/05/2017 11:05 AM EDT

## 2017-05-05 NOTE — Progress Notes (Signed)
Formatting of this note is different from the original.  Ortho / Neurosurgery NP Note    POD# 1  s/p RIGHT TOTAL KNEE ARTHROPLASTY WITH NAVIGATION   Pt seen with PT in room      Pt sitting on edge of bed about to stand and walk with PT.  Pt exerting herself to stand and walk.   Complaints of persistent numbness - noted extensive hx of neuropathy POA     VSS Afebrile.  Voiding status: + void    Labs  Lab Results   Component Value Date/Time    HGB 11.6 05/05/2017 03:42 AM     Lab Results   Component Value Date/Time    INR 1.0 04/20/2017 09:16 AM       Body mass index is 34.05 kg/(m^2). : A BMI > 30 is classified as obesity and > 40 is classified as morbid obesity.     Dressing c.d.i  Cryotherapy in place over incision  Calves soft and supple; No pain with passive stretch  Sensation and motor intact  SCDs for mechanical DVT proph while in bed    PLAN:  1) PT BID, slow progress with PT.  She has baseline balance deficits d/t Meniere's with reported near daily falls. Pt was given RW at pre-op joint class and endorses not falls but many close calls since. Min assist for toileting.   Weakness and required coaching for deep breathing and hand transfers / appropriate use of walker.    2) Aspirin 325 mg PO BID for DVT Prophylaxis   3) GI Prophylaxis - Pepcid  4) Readniess for discharge:     [x]  Vital Signs stable    [x]  Hgb stable    [x]  + Voiding    [x]  Wound intact, drainage minimal    [x]  Tolerating PO intake     []  Cleared by PT  - see above performance and needs    []  Stair training completed (if applicable)    []  Independent / Programmer, systems (household distance)     []  Bed mobility     []  Car transfers     []  ADL?s    [x]  Adequate pain control on oral medication alone     Plan to continue to work on PT for safe ambulation.  Limited help at home and concern for risk of falling.  She will likely need placement at [facility] tomorrow if medically stable  Has been accepted to [facility].    Londell Moh, NP  DNP, ACNP-BC,  ONP-C    Electronically signed by Londell Moh, NP at 05/05/2017  2:15 PM EDT

## 2017-05-05 NOTE — Progress Notes (Signed)
Formatting of this note might be different from the original.  Bedside and Verbal shift change report given to Trula Ore RN (oncoming nurse) by Laure Kidney (offgoing nurse). Report included the following information SBAR, Kardex, OR Summary, Intake/Output, MAR, Accordion and Recent Results.     Electronically signed by Ara Kussmaul at 05/05/2017  7:28 AM EDT

## 2017-05-05 NOTE — Progress Notes (Addendum)
Problem: Mobility Impaired (Adult and Pediatric)  Goal: *Acute Goals and Plan of Care (Insert Text)  Physical Therapy Goals  Initiated 05/05/2017    1. Patient will move from supine to sit and sit to supine  in bed with independence within 4 days.  2. Patient will perform sit to stand with independence within 4 days.  3. Patient will ambulate with modified independence for 300 feet with the least restrictive device within 4 days.  4. Patient will ascend/descend 4 stairs with 1 handrail(s) with modified independence within 4 days.  5. Patient will perform home exercise program per protocol with independence within 4 days.  6. Patient will demonstrate AROM 0-90 degrees in operative joint within 4 days.    physical Therapy EVALUATION  Patient: Katherine [name] (61 y.o. female)  Date: 05/05/2017  Primary Diagnosis: OSTEOARTHRITIS  RIGHT KNEE   Osteoarthritis of right knee  Procedure(s) (LRB):  RIGHT TOTAL KNEE ARTHROPLASTY WITH NAVIGATION (Right) 1 Day Post-Op   Precautions:   Fall, WBAT    ASSESSMENT :  Based on the objective data described below, the patient presents with impaired balance, high fall risk, post-op knee pain, impaired RLE sensation and decreased ROM and strength limiting functional mobility following R TKA POD#1. Pt with baseline balance deficits d/t Meniere's with reported near daily falls. Pt was given RW at pre-op joint class and endorses not falls but many close calls since. Today pt performed supine HEP with Min A, good quad contraction noted. Overall Min-Mod A for OOB mobility with most difficulty noted during sit>stand transfers d/t weakness and difficulty transfers hands to RW. Pt ambulated ~44ft in the hallway prior to reporting dizziness and becoming tremulous. Cues for deep breathing and relaxation improved symptoms and pt was able to make it back to the room with chair follow. Step to gait displayed throughout. Min A for toileting prior to leaving up in chair with all VSS. Noted increased  swelling in medial compartment of knee. Plan for discharge to SNF when stable to improve balance, strength and activity tolerance.    Patient will benefit from skilled intervention to address the above impairments.  Patient???s rehabilitation potential is considered to be Good  Factors which may influence rehabilitation potential include:   []          None noted  []          Mental ability/status  [x]          Medical condition  []          Home/family situation and support systems  []          Safety awareness  []          Pain tolerance/management  []          Other:      PLAN :  Recommendations and Planned Interventions:  [x]            Bed Mobility Training             [x]     Neuromuscular Re-Education  [x]            Transfer Training                   []     Orthotic/Prosthetic Training  [x]            Gait Training                         []     Modalities  [x]   Therapeutic Exercises           []     Edema Management/Control  [x]            Therapeutic Activities            [x]     Patient and Family Training/Education  []            Other (comment):    Frequency/Duration: Patient will be followed by physical therapy  twice daily to address goals.  Discharge Recommendations: Skilled Nursing Facility  Further Equipment Recommendations for Discharge: TBD at rehab     SUBJECTIVE:   Patient stated ???The numbness was not this bad beforehand.???    OBJECTIVE DATA SUMMARY:   HISTORY:    Past Medical History:   Diagnosis Date   ??? Arthritis    ??? Autoimmune disease ([place])    ??? Bowel disease    ??? Burning with urination    ??? Chronic pain    ??? Depression    ??? Diarrhea    ??? Difficult intubation     difficulty breathing after awakening in past.   ??? Dizziness    ??? Easy bruising    ??? Headache(784.0)    ??? Hearing loss    ??? Ill-defined condition     past hx of shingles--eye/ear 13 years ago   ??? Night sweats    ??? Numbness    ??? Other ill-defined conditions(799.89)     shingles, lupus, menieres, Fibromyalgia   ??? Sweats, menopausal     ??? Visual loss      Past Surgical History:   Procedure Laterality Date   ??? ABDOMEN SURGERY PROC UNLISTED  1994    1 foot of colon removed   ??? HX APPENDECTOMY     ??? HX BREAST BIOPSY Left     benign core bx   ??? HX CHOLECYSTECTOMY     ??? HX CORNEAL TRANSPLANT      right   ??? HX GYN      hysterectomy   ??? HX HERNIA REPAIR      left   ??? HX ORTHOPAEDIC      back x2, right knee   ??? HX ORTHOPAEDIC  01-2014     right hand surgery   ??? HX TONSILLECTOMY     ??? HX TRANSPLANT      right corona   ??? HX UROLOGICAL      bladder surgery x2     Prior Level of Function/Home Situation: Lives alone. Multiple falls d/t balance deficits and knee pain. Pt has been using a RW more recently and was receiving HHPT  Personal factors and/or comorbidities impacting plan of care: meniere's, falls    Home Situation  Home Environment: Private residence  # Steps to Enter: 4  Rails to Enter: Yes  Hand Rails : Bilateral  One/Two Story Residence: One story  Living Alone: Yes  Support Systems: Family member(s)  Patient Expects to be Discharged to:: Private residence  Current DME Used/Available at Home: Environmental consultant, rolling    EXAMINATION/PRESENTATION/DECISION MAKING:   Critical Behavior:  Neurologic State: Alert  Orientation Level: Oriented X4        Hearing:  Auditory  Auditory Impairment: Hard of hearing, left side  Hearing Aids/Status: At home, Left  Skin:    Edema:   Range Of Motion:  AROM: Generally decreased, functional           PROM: Generally decreased, functional           Strength:  Strength: Generally decreased, functional                    Tone & Sensation:   Tone: Normal              Sensation: Impaired (RLE below knee numbness/tingling)               Coordination:  Coordination: Generally decreased, functional  Vision:      Functional Mobility:  Bed Mobility:  Rolling: Contact guard assistance  Supine to Sit: Contact guard assistance;Additional time        Transfers:  Sit to Stand: Minimum assistance;Moderate assistance;Additional  time;Assist x1 (multiple trials)  Stand to Sit: Minimum assistance                       Balance:   Sitting: Intact  Standing: Impaired  Standing - Static: Fair  Standing - Dynamic : Fair  Ambulation/Gait Training:  Distance (ft): 75 Feet (ft)  Assistive Device: Gait belt;Walker, rolling  Ambulation - Level of Assistance: Minimal assistance        Gait Abnormalities: Altered arm swing;Decreased step clearance;Step to gait        Base of Support: Narrowed;Center of gravity altered  Stance: Right decreased  Speed/Cadence: Pace decreased (<100 feet/min);Slow;Shuffled  Step Length: Left shortened;Right shortened                     Stairs:              Therapeutic Exercises:   Ankle pumps, quad sets, glute sets, heel slides    Functional Measure:  Tinetti test:    Sitting Balance: 1  Arises: 0  Attempts to Rise: 0  Immediate Standing Balance: 1  Standing Balance: 1  Nudged: 0  Eyes Closed: 0  Turn 360 Degrees - Continuous/Discontinuous: 0  Turn 360 Degrees - Steady/Unsteady: 0  Sitting Down: 1  Balance Score: 4  Indication of Gait: 1  R Step Length/Height: 0  L Step Length/Height: 0  R Foot Clearance: 1  L Foot Clearance: 1  Step Symmetry: 1  Step Continuity: 0  Path: 0  Trunk: 0  Walking Time: 0  Gait Score: 4  Total Score: 8       Tinetti Test and G-code impairment scale:  Percentage of Impairment CH    0%   CI    1-19% CJ    20-39% CK    40-59% CL    60-79% CM    80-99% CN     100%   Tinetti  Score 0-28 28 23-27 17-22 12-16 6-11 1-5 0       Tinetti Tool Score Risk of Falls  <19 = High Fall Risk  19-24 = Moderate Fall Risk  25-28 = Low Fall Risk  Tinetti ME. Performance-Oriented Assessment of Mobility Problems in Elderly Patients. [place]; [id number]. (Scoring Description: PT Bulletin Feb. 10, 1993)    Older adults: Letta Moynahan et al, 2009; n = [place] elderly evaluated with ABC, POMA, ADL, and IADL)  ?? Mean POMA score for males aged 43-79 years = 26.21(3.40)  ?? Mean POMA score for females age 45-79 years = 25.16(4.30)   ?? Mean POMA score for males over 80 years = 23.29(6.02)  ?? Mean POMA score for females over 80 years = 17.20(8.32)         G codes:  In compliance with CMS???s Claims Based Outcome Reporting, the following G-code set was chosen  for this patient based on their primary functional limitation being treated:    The outcome measure chosen to determine the severity of the functional limitation was the Tinetti with a score of 8/28 which was correlated with the impairment scale.    ? Mobility - Walking and Moving Around:    [phone] - CURRENT STATUS: CL - 60%-79% impaired, limited or restricted   G8979 - GOAL STATUS: CK - 40%-59% impaired, limited or restricted   U0454 - D/C STATUS:  ---------------To be determined---------------      Physical Therapy Evaluation Charge Determination   History Examination Presentation Decision-Making   MEDIUM  Complexity : 1-2 comorbidities / personal factors will impact the outcome/ POC  MEDIUM Complexity : 3 Standardized tests and measures addressing body structure, function, activity limitation and / or participation in recreation  LOW Complexity : Stable, uncomplicated  Other outcome measures Tinetti  LOW       Based on the above components, the patient evaluation is determined to be of the following complexity level: LOW     Pain:  Pain Scale 1: Numeric (0 - 10)  Pain Intensity 1: 5  Pain Location 1: Knee  Pain Orientation 1: Right     Pain Intervention(s) 1: Medication (see MAR)  Activity Tolerance:   Good  Please refer to the flowsheet for vital signs taken during this treatment.  After treatment:   [x]          Patient left in no apparent distress sitting up in chair  []          Patient left in no apparent distress in bed  [x]          Call bell left within reach  [x]          Nursing notified  []          Caregiver present  []          Bed alarm activated    COMMUNICATION/EDUCATION:   The patient???s plan of care was discussed with: Registered Nurse.   [x]          Fall prevention education was provided and the patient/caregiver indicated understanding.  [x]          Patient/family have participated as able in goal setting and plan of care.  [x]          Patient/family agree to work toward stated goals and plan of care.  []          Patient understands intent and goals of therapy, but is neutral about his/her participation.  []          Patient is unable to participate in goal setting and plan of care.    Thank you for this referral.  Gean Birchwood, PT   Time Calculation: 33 mins

## 2017-05-05 NOTE — Progress Notes (Addendum)
Ortho / Neurosurgery NP Note    POD# 1  s/p RIGHT TOTAL KNEE ARTHROPLASTY WITH NAVIGATION   Pt seen with PT in room      Pt sitting on edge of bed about to stand and walk with PT.  Pt exerting herself to stand and walk.   Complaints of persistent numbness - noted extensive hx of neuropathy POA     VSS Afebrile.  Voiding status: + void    Labs  Lab Results   Component Value Date/Time    HGB 11.6 05/05/2017 03:42 AM      Lab Results   Component Value Date/Time    INR 1.0 04/20/2017 09:16 AM        Body mass index is 34.05 kg/(m^2). : A BMI > 30 is classified as obesity and > 40 is classified as morbid obesity.     Dressing c.d.i  Cryotherapy in place over incision  Calves soft and supple; No pain with passive stretch  Sensation and motor intact  SCDs for mechanical DVT proph while in bed     PLAN:  1) PT BID, slow progress with PT.  She has baseline balance deficits d/t Meniere's with reported near daily falls. Pt was given RW at pre-op joint class and endorses not falls but many close calls since. Min assist for toileting.   Weakness and required coaching for deep breathing and hand transfers / appropriate use of walker.    2) Aspirin 325 mg PO BID for DVT Prophylaxis   3) GI Prophylaxis - Pepcid  4) Readniess for discharge:     [x]  Vital Signs stable    [x]  Hgb stable    [x]  + Voiding    [x]  Wound intact, drainage minimal    [x]  Tolerating PO intake     []  Cleared by PT  - see above performance and needs    []  Stair training completed (if applicable)    []  Independent / Automotive engineer (household distance)     []  Bed mobility     []  Car transfers     []  ADL???s    [x]  Adequate pain control on oral medication alone     Plan to continue to work on PT for safe ambulation.  Limited help at home and concern for risk of falling.  She will likely need placement at [facility] tomorrow if medically stable  Has been accepted to [facility].    Nance Pew, NP  DNP, ACNP-BC, ONP-C

## 2017-05-05 NOTE — Progress Notes (Signed)
Pt has been accepted by [facility] for admission.     CM was advised pt has ortho bundle and will discharge 09.12;     CM advised pt of acceptance.     Care Management Interventions  PCP Verified by CM: Yes  Mode of Transport at Discharge: Other (see comment)  Transition of Care Consult (CM Consult): SNF  Partner SNF: Yes  Discharge Durable Medical Equipment: No  Health Maintenance Reviewed: Yes  Physical Therapy Consult: Yes  Occupational Therapy Consult: No  Speech Therapy Consult: No  Current Support Network: Own Home, Lives Alone  Confirm Follow Up Transport: Family  Plan discussed with Pt/Family/Caregiver: Yes  Freedom of Choice Offered: Yes  Discharge Location  Discharge Placement: Skilled nursing facility    [place], MSW, [place] Manager   [phone]

## 2017-05-05 NOTE — Progress Notes (Signed)
Problem: Mobility Impaired (Adult and Pediatric)  Goal: *Acute Goals and Plan of Care (Insert Text)  Physical Therapy Goals  Initiated 05/05/2017    1. Patient will move from supine to sit and sit to supine  in bed with independence within 4 days.  2. Patient will perform sit to stand with independence within 4 days.  3. Patient will ambulate with modified independence for 300 feet with the least restrictive device within 4 days.  4. Patient will ascend/descend 4 stairs with 1 handrail(s) with modified independence within 4 days.  5. Patient will perform home exercise program per protocol with independence within 4 days.  6. Patient will demonstrate AROM 0-90 degrees in operative joint within 4 days.     physical Therapy TREATMENT  Patient: Katherine [name] (61 y.o. female)  Date: 05/05/2017  Diagnosis: OSTEOARTHRITIS  RIGHT KNEE   Osteoarthritis of right knee S/P total knee replacement, right  Procedure(s) (LRB):  RIGHT TOTAL KNEE ARTHROPLASTY WITH NAVIGATION (Right) 1 Day Post-Op  Precautions: Fall, WBAT  Chart, physical therapy assessment, plan of care and goals were reviewed.    ASSESSMENT:  Pt cleared by RN and received supine in bed with family members present. Tolerated PM session well with improvements in balance, strength and pain tolerance. ROM more limited this afternoon in both flexion and extension. Encouraged continued work on exercises throughout the day. Pt CGA with transfers and gait training with RW. With assist to advance RW forward pt demonstrated increased gait speed and step length. Pt requiring near constant cueing to maintain forward progression and quicker turnover. One episode of buckling with pt was able to self correct. Returned to chair at end of session with all needs within reach. Continues to c/o increased pain to the touch at medial knee +swelling +redness(ice was on prior). Plan for discharge to SNF remains appropriate.   Progression toward goals:   [x]       Improving appropriately and progressing toward goals  []       Improving slowly and progressing toward goals  []       Not making progress toward goals and plan of care will be adjusted     PLAN:  Patient continues to benefit from skilled intervention to address the above impairments.  Continue treatment per established plan of care.  Discharge Recommendations:  [place]  Further Equipment Recommendations for Discharge:  TBD at rehab     SUBJECTIVE:   Don't squeeze it this time that really hurt    OBJECTIVE DATA SUMMARY:   Range of Motion:  AROM: Generally decreased, functional  PROM: Generally decreased, functional                    Functional Mobility Training:  Bed Mobility:  Rolling: Contact guard assistance  Supine to Sit: Contact guard assistance              Transfers:  Sit to Stand: Contact guard assistance  Stand to Sit: Contact guard assistance                             Ambulation/Gait Training:  Distance (ft): 130 Feet (ft)  Assistive Device: Gait belt;Walker, rolling  Ambulation - Level of Assistance: Contact guard assistance;Assist x1;Additional time        Gait Abnormalities: Early heel rise;Decreased step clearance;Antalgic        Base of Support: Widened  Stance: Right decreased  Speed/Cadence: Pace decreased (<100 feet/min);Slow  Step Length: Right shortened;Left shortened                    Stairs:           Therapeutic Exercises:   Exercises performed per protocol.  See morning treatment note for description.  Pain:  Pain Scale 1: Numeric (0 - 10)  Pain Intensity 1: 5  Pain Location 1: Knee  Pain Orientation 1: Right     Pain Intervention(s) 1: Medication (see MAR)  Activity Tolerance:   Good  Please refer to the flowsheet for vital signs taken during this treatment.  After treatment:   [x]  Patient left in no apparent distress sitting up in chair  []  Patient left in no apparent distress in bed  [x]  Call bell left within reach  [x]  Nursing notified  []  Caregiver present   []  Bed alarm activated    COMMUNICATION/COLLABORATION:   The patient???s plan of care was discussed with: Registered Nurse    Gean Birchwood, PT   Time Calculation: 24 mins

## 2017-05-05 NOTE — Progress Notes (Signed)
Reason for Admission:  Osteoarthritis right knee;         RRAT Score: 8              Plan for utilizing home health:  Prefers SNF placement with [facility]    Likelihood of Readmission:  LOW   Transition of Care Plan:                      Pt is a 61 y/o Caucasian female admitted for Osteoarthritis right knee; Pt lives alone in a one story home with no steps to the entrance. Pt is independent with ADLs and rarely drives at baseline. Pt has no previous HH or SNF provider. Pt has access to hospital issued RW, rollator, shower bench and cane. Pt prefers to use [facility] at the intersection of [address]. Pt will be transported by family via private vehicle.     CM met with pt to complete initial assessment. Pt is alert and oriented to person, place,time and situation. CM will continue to follow pt to assist with discharge plans as needed.     CM spoke with pt regarding dispo and she chose [facility] for post acute care. CM sent referral to [facility] via [place]. CM placed FOC on pt bedside chart. Pt is an Ortho Bundle and will not require three midnight stay for discharge.     Care Management Interventions  PCP Verified by CM: Yes  Mode of Transport at Discharge: Other (see comment)  Transition of Care Consult (CM Consult): SNF  Partner SNF: Yes  Discharge Durable Medical Equipment: No  Health Maintenance Reviewed: Yes  Physical Therapy Consult: Yes  Occupational Therapy Consult: No  Speech Therapy Consult: No  Current Support Network: Own Home, Lives Alone  Confirm Follow Up Transport: Family  Plan discussed with Pt/Family/Caregiver: Yes  Freedom of Choice Offered: Yes  Discharge Location  Discharge Placement: Skilled nursing facility    [place], MSW, [place] Manager   [phone]

## 2017-05-05 NOTE — Progress Notes (Signed)
Bedside and Verbal shift change report given to [place] (oncoming nurse) by Sherryll Burger (offgoing nurse). Report included the following information SBAR, Kardex, OR Summary, Intake/Output, MAR, Accordion and Recent Results.

## 2017-05-06 ENCOUNTER — Inpatient Hospital Stay: Admit: 2017-05-06 | Payer: MEDICARE | Primary: Family Medicine

## 2017-05-06 LAB — METABOLIC PANEL, BASIC
Anion gap: 6 mmol/L (ref 5–15)
BUN/Creatinine ratio: 18 (ref 12–20)
BUN: 12 MG/DL (ref 6–20)
CO2: 26 mmol/L (ref 21–32)
Calcium: 8.5 MG/DL (ref 8.5–10.1)
Chloride: 105 mmol/L (ref 97–108)
Creatinine: 0.66 MG/DL (ref 0.55–1.02)
GFR est AA: >60 mL/min/{1.73_m2} (ref >60)
GFR est non-AA: >60 mL/min/{1.73_m2} (ref >60)
Glucose: 123 mg/dL — ABNORMAL HIGH (ref 65–100)
Potassium: 3.9 mmol/L (ref 3.5–5.1)
Sodium: 137 mmol/L (ref 136–145)

## 2017-05-06 LAB — GLUCOSE, POC
Glucose (POC): 127 mg/dL — ABNORMAL HIGH (ref 65–100)
Glucose (POC): 79 mg/dL (ref 65–100)
Glucose (POC): 90 mg/dL (ref 65–100)

## 2017-05-06 LAB — HEMOGLOBIN: HGB: 11.8 g/dL (ref 11.5–16.0)

## 2017-05-06 MED ORDER — LINACLOTIDE 145 MCG CAPSULE
145 mcg | Freq: Every day | ORAL | Status: DC
Start: 2017-05-06 — End: 2017-05-06

## 2017-05-06 MED ORDER — NALOXONE 4 MG/ACTUATION NASAL SPRAY
4 mg/actuation | NASAL | 0 refills | Status: DC | PRN
Start: 2017-05-06 — End: 2018-03-01

## 2017-05-06 MED ORDER — LINACLOTIDE 145 MCG CAPSULE
145 mcg | Freq: Every day | ORAL | Status: DC
Start: 2017-05-06 — End: 2017-05-06
  Administered 2017-05-06: 15:00:00 via ORAL

## 2017-05-06 MED ORDER — LORAZEPAM 1 MG TAB
1 mg | Freq: Four times a day (QID) | ORAL | Status: DC | PRN
Start: 2017-05-06 — End: 2017-05-06
  Administered 2017-05-06: 17:00:00 via ORAL

## 2017-05-06 MED ORDER — LORAZEPAM 1 MG TAB
1 mg | ORAL_TABLET | Freq: Four times a day (QID) | ORAL | 0 refills | Status: DC | PRN
Start: 2017-05-06 — End: 2018-03-01

## 2017-05-06 MED FILL — MAPAP (ACETAMINOPHEN) 325 MG TABLET: 325 mg | ORAL | Qty: 2

## 2017-05-06 MED FILL — PRIMIDONE 50 MG TAB: 50 mg | ORAL | Qty: 1

## 2017-05-06 MED FILL — OXYCODONE 5 MG TAB: 5 mg | ORAL | Qty: 2

## 2017-05-06 MED FILL — NORTRIPTYLINE 25 MG CAP: 25 mg | ORAL | Qty: 2

## 2017-05-06 MED FILL — BD POSIFLUSH NORMAL SALINE 0.9 % INJECTION SYRINGE: INTRAMUSCULAR | Qty: 10

## 2017-05-06 MED FILL — VALTREX 500 MG TABLET: 500 mg | ORAL | Qty: 2

## 2017-05-06 MED FILL — TOPIRAMATE 100 MG TAB: 100 mg | ORAL | Qty: 2

## 2017-05-06 MED FILL — GABAPENTIN 300 MG CAP: 300 mg | ORAL | Qty: 2

## 2017-05-06 MED FILL — DOK PLUS 8.6 MG-50 MG TABLET: ORAL | Qty: 1

## 2017-05-06 MED FILL — LINZESS 145 MCG CAPSULE: 145 mcg | ORAL | Qty: 1

## 2017-05-06 MED FILL — FAMOTIDINE 20 MG TAB: 20 mg | ORAL | Qty: 1

## 2017-05-06 MED FILL — DIPHENHYDRAMINE 12.5 MG/5 ML ELIXIR: 12.5 mg/5 mL | ORAL | Qty: 10

## 2017-05-06 MED FILL — DULOXETINE 30 MG CAP, DELAYED RELEASE: 30 mg | ORAL | Qty: 2

## 2017-05-06 MED FILL — LORAZEPAM 1 MG TAB: 1 mg | ORAL | Qty: 1

## 2017-05-06 MED FILL — HEALTHYLAX 17 GRAM ORAL POWDER PACKET: 17 gram | ORAL | Qty: 1

## 2017-05-06 MED FILL — ASPIRIN 325 MG TAB, DELAYED RELEASE: 325 mg | ORAL | Qty: 1

## 2017-05-06 NOTE — Progress Notes (Signed)
Formatting of this note might be different from the original.  Bedside and Verbal shift change report given to [place] (Cabin crew) by Vonna Kotyk (offgoing nurse). Report included the following information SBAR, Kardex, MAR and Recent Results.     Electronically signed by Bryson Corona at 05/06/2017  7:50 AM EDT

## 2017-05-06 NOTE — Progress Notes (Signed)
Formatting of this note might be different from the original.  Report call to [facility] prior to discharge  Electronically signed by Juanda Chance at 05/06/2017  1:50 PM EDT

## 2017-05-06 NOTE — Progress Notes (Signed)
Formatting of this note might be different from the original.  Pt will discharge to [facility] Call report number for floor nurse is [phone] bed assignment is [id number];     Pt family will transport at discharge. There were no additional discharge needs.     Care Management Interventions  PCP Verified by CM: Yes  Mode of Transport at Discharge: Other (see comment)  Transition of Care Consult (CM Consult): SNF  Partner SNF: Yes  Discharge Durable Medical Equipment: No  Health Maintenance Reviewed: Yes  Physical Therapy Consult: Yes  Occupational Therapy Consult: No  Speech Therapy Consult: No  Current Support Network: Own Home, Lives Alone  Confirm Follow Up Transport: Family  Plan discussed with Pt/Family/Caregiver: Yes  Freedom of Choice Offered: Yes  Discharge Location  Discharge Placement: Skilled nursing facility    [place], MSW, CM  [facility] Manager   [phone]    Electronically signed by Stephannie Li S at 05/06/2017  9:21 AM EDT

## 2017-05-06 NOTE — Discharge Summary (Signed)
Ortho Discharge Summary    Patient ID:  Katherine [name]  [id number]  female  61 y.o.  1956-04-22    Admit date: 05/04/2017    Discharge date: 05/06/2017    Admitting Physician: Marlana Latus, MD     Consulting Physician(s):   Treatment Team: Attending Provider: Marlana Latus, MD; Nurse Practitioner: Lennox Laity, NP; Utilization Review: Heywood Bene, RN; Nurse Practitioner: Nance Pew, NP; Care Manager: Merlene Laughter    Date of Surgery:   05/04/2017     Preoperative Diagnosis:  OSTEOARTHRITIS  RIGHT KNEE     Postoperative Diagnosis:   OSTEOARTHRITIS  RIGHT KNEE     Procedure(s):     RIGHT TOTAL KNEE ARTHROPLASTY WITH NAVIGATION     Anesthesia Type:   General     Surgeon: Marlana Latus, MD                            HPI:  Pt is a 61 y.o. female who has a history of OSTEOARTHRITIS  RIGHT KNEE   with pain and limitations of activities of daily living who presents at this time for a  right TKA following the failure of conservative management.    PMH:   Past Medical History:   Diagnosis Date   ??? Arthritis    ??? Autoimmune disease ([place])    ??? Bowel disease    ??? Burning with urination    ??? Chronic pain    ??? Depression    ??? Diarrhea    ??? Difficult intubation     difficulty breathing after awakening in past.   ??? Dizziness    ??? Easy bruising    ??? Headache(784.0)    ??? Hearing loss    ??? Ill-defined condition     past hx of shingles--eye/ear 13 years ago   ??? Night sweats    ??? Numbness    ??? Other ill-defined conditions(799.89)     shingles, lupus, menieres, Fibromyalgia   ??? Sweats, menopausal    ??? Visual loss        Body mass index is 34.05 kg/(m^2). BMI greater than 30 is classified as obesity.     Medications upon admission :   Prior to Admission Medications   Prescriptions Last Dose Informant Patient Reported? Taking?   HYDROcodone-acetaminophen (NORCO) 10-325 mg tablet 04/27/2017 at Unknown time  Yes Yes   Sig: Take 1 Tab by mouth two (2) times daily as needed for Pain.   POTASSIUM CHLORIDE PO 05/04/2017 at Unknown  time  Yes Yes   Sig: Take 10 mEq by mouth two (2) times a day.   azelastine (ASTELIN) 137 mcg nasal spray 05/04/2017 at Unknown time  Yes Yes   Sig: 1 Spray by Nasal route two (2) times a day. Administer to right and left nostril.   butalbital-acetaminophen-caffeine (FIORICET, ESGIC) 50-325-40 mg per tablet 05/03/2017 at Unknown time  Yes Yes   Sig: Take 2 Tabs by mouth as needed for Headache. Take 2 tables at onset of migraine, then take 1 every 4 hrs as needed for migraine   cetirizine HCl (CETIRIZINE PO) 05/04/2017 at Unknown time  Yes Yes   Sig: Take 10 mg by mouth every morning.   cyclobenzaprine (FLEXERIL) 10 mg tablet 05/04/2017 at Unknown time  Yes Yes   Sig: Take 10 mg by mouth three (3) times daily as needed for Muscle Spasm(s).   duloxetine (CYMBALTA) 60 mg capsule 05/04/2017  at Unknown time  Yes Yes   Sig: Take 60 mg by mouth two (2) times a day.   ergocalciferol (VITAMIN D) 50,000 unit capsule 04/03/2017 at Unknown time  Yes Yes   Sig: Take 50,000 Units by mouth every seven (7) days.   flaxseed oil (OMEGA 3 PO) 04/03/2017 at Unknown time  Yes Yes   Sig: Take 1 Cap by mouth daily.   gabapentin (NEURONTIN) 300 mg capsule 05/04/2017 at Unknown time  No Yes   Sig: Take 2 Caps by mouth three (3) times daily.   hydroxychloroquine (PLAQUENIL) 200 mg tablet 05/04/2017 at Unknown time  Yes Yes   Sig: Take 200 mg by mouth two (2) times a day.   linaclotide (LINZESS) 145 mcg cap capsule 05/03/2017 at Unknown time  Yes Yes   Sig: Take 145 mcg by mouth Daily (before breakfast).   melatonin 10 mg cap 05/03/2017 at Unknown time  Yes Yes   Sig: Take 1 Cap by mouth nightly.   meloxicam (MOBIC) 15 mg tablet 05/03/2017 at Unknown time  Yes Yes   Sig: Take 15 mg by mouth daily.   nortriptyline (PAMELOR) 50 mg capsule 05/03/2017 at Unknown time  Yes Yes   Sig: Take 50 mg by mouth nightly.   prednisoLONE acetate (PRED FORTE) 1 % ophthalmic suspension 05/04/2017 at Unknown time  Yes Yes   Sig: Administer 1 Drop to right eye every morning.    prenatal vit 91/iron/folic/dha (PRENATAL + DHA PO) 04/27/2017 at Unknown time  Yes Yes   Sig: Take 1 Tab by mouth daily.   primidone (MYSOLINE) 50 mg tablet 05/04/2017 at Unknown time  No Yes   Sig: Take 1 Tab by mouth three (3) times daily.   raNITIdine (ZANTAC) 150 mg tablet 05/04/2017 at Unknown time  Yes Yes   Sig: TAKE 1 TABLET BY MOUTH TWICE A DAY   topiramate (TOPAMAX) 200 mg tablet   Yes Yes   Sig: Take 200 mg by mouth two (2) times a day.   triamterene-hydrochlorothiazide (DYAZIDE) 37.5-25 mg per capsule 05/04/2017 at Unknown time  Yes Yes   Sig: Take 1 Cap by mouth daily.   valacyclovir (VALTREX) 1 g tablet 05/04/2017 at Unknown time  Yes Yes   Sig: Take 1,000 mg by mouth every morning.      Facility-Administered Medications: None        Allergies:    Allergies   Allergen Reactions   ??? Morphine Other (comments)     "it does not stop the pain"   ??? Pcn [Penicillins] Rash and Swelling        Hospital Course:  The patient underwent surgery.  Intra-operative complications: None; patient tolerated the procedure well. Was taken to the PACU in stable condition and then transferred to the ortho floor.      Perioperative Antibiotics:  Ancef     Postoperative Pain Management:  Oxycodone    DVT Prophylaxis: Aspirin 325 mg PO BID for thirty days.     Postoperative transfusions:    Number of units banked PRBCs =   none     Post Op complications: None- patient observed a popping sound in her knee and associated pain on POD #2. X-rays taken to ensure no issues with prosthesis and they look fine. Likely soft tissue pain. Dr. Eugenie Birks aware and in agreement with discharge.     Hemoglobin at discharge:    Lab Results   Component Value Date/Time    HGB 11.8 05/06/2017 02:58 AM  INR 1.0 04/20/2017 09:16 AM       Dressing - clean, dry and intact. No significant erythema or swelling. Neurovascular exam found to be within normal limits. Wound appears to be healing without any evidence of infection. Pt had a HVAC drain that was removed  on POD# 1.      Physical Therapy started on the day following surgery and participated in bed mobility, transfers and ambulation.        Gait:  Gait  Base of Support: Widened  Speed/Cadence: Pace decreased (<100 feet/min), Slow  Step Length: Right shortened, Left shortened  Stance: Right decreased  Gait Abnormalities: Decreased step clearance, Antalgic, Step to gait  Ambulation - Level of Assistance: Contact guard assistance  Distance (ft): 60 Feet (ft)  Assistive Device: Gait belt, Walker, rolling                   Discharged to: SNF    Condition on Discharge:   stable    Discharge instructions:  - Anticoagulate with Aspirin 325 mg PO BID for thirty days.   - Take pain medications as prescribed  - Resume pre hospital diet      - Discharge activity: activity as tolerated  - Ambulate (with assistive device as needed)   - Weight bearing status WBAT  - Wound Care Keep wound clean and dry.  See discharge instruction sheet.            -DISCHARGE MEDICATION LIST     Current Discharge Medication List      START taking these medications    Details   !! naloxone (NARCAN) 4 mg/actuation nasal spray 1 Spray by IntraNASal route as needed for Other (Respiratory depression). Use 1 spray intranasally into 1 nostril. Call 911. Use a new Narcan nasal spray for subsequent doses and administer into alternating nostrils. May repeat every 2 to 3 minutes as needed.  Qty: 1 Each, Refills: 0      acetaminophen (TYLENOL) 325 mg tablet Take 2 Tabs by mouth every six (6) hours for 14 days.  Qty: 112 Tab, Refills: 0      aspirin delayed-release 325 mg tablet Take 1 Tab by mouth two (2) times a day for 30 days.  Qty: 60 Tab, Refills: 0      famotidine (PEPCID) 20 mg tablet Take 1 Tab by mouth two (2) times a day for 30 days.  Qty: 60 Tab, Refills: 0      oxyCODONE IR (ROXICODONE) 5 mg immediate release tablet Take 1-2 Tabs by mouth every four (4) hours as needed. Max Daily Amount: 60 mg.  Qty: 30 Tab, Refills: 0    Associated Diagnoses: S/P  total knee replacement, right      polyethylene glycol (MIRALAX) 17 gram packet Take 1 Packet by mouth daily as needed (constipation) for up to 15 days.  Qty: 15 Packet, Refills: 0      senna-docusate (PERICOLACE) 8.6-50 mg per tablet Take 1 Tab by mouth daily.  Qty: 30 Tab, Refills: 0      !! naloxone (NARCAN) 4 mg/actuation nasal spray 1 Spray by IntraNASal route as needed (respiratory depression). Give single spray into one nostril. Call 911. Give additional doses every 2 to 3 minutes alternating nostrils until assistance arrives using a new nasal spray with each dose, if patient does not respond or responds and then relapses.  Qty: 1 Each, Refills: 0       !! - Potential duplicate medications found. Please discuss with provider.  CONTINUE these medications which have CHANGED    Details   !! LORazepam (ATIVAN) 1 mg tablet Take 1 Tab by mouth every six (6) hours as needed for Anxiety. Max Daily Amount: 4 mg.  Qty: 30 Tab, Refills: 0    Associated Diagnoses: Palpitations      !! LORazepam (ATIVAN) 2 mg tablet Take 0.5 Tabs by mouth every six (6) hours as needed for Anxiety. Max Daily Amount: 4 mg.  Qty: 10 Tab, Refills: 0    Associated Diagnoses: Adjustment disorder with mixed anxiety and depressed mood       !! - Potential duplicate medications found. Please discuss with provider.      CONTINUE these medications which have NOT CHANGED    Details   topiramate (TOPAMAX) 200 mg tablet Take 200 mg by mouth two (2) times a day.      flaxseed oil (OMEGA 3 PO) Take 1 Cap by mouth daily.      cetirizine HCl (CETIRIZINE PO) Take 10 mg by mouth every morning.      nortriptyline (PAMELOR) 50 mg capsule Take 50 mg by mouth nightly.      prenatal vit 91/iron/folic/dha (PRENATAL + DHA PO) Take 1 Tab by mouth daily.      POTASSIUM CHLORIDE PO Take 10 mEq by mouth two (2) times a day.      melatonin 10 mg cap Take 1 Cap by mouth nightly.      raNITIdine (ZANTAC) 150 mg tablet TAKE 1 TABLET BY MOUTH TWICE A DAY  Refills: 5       gabapentin (NEURONTIN) 300 mg capsule Take 2 Caps by mouth three (3) times daily.  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      primidone (MYSOLINE) 50 mg tablet Take 1 Tab by mouth three (3) times daily.  Qty: 270 Tab, Refills: 3    Associated Diagnoses: Benign essential tremor      hydroxychloroquine (PLAQUENIL) 200 mg tablet Take 200 mg by mouth two (2) times a day.      meloxicam (MOBIC) 15 mg tablet Take 15 mg by mouth daily.      valacyclovir (VALTREX) 1 g tablet Take 1,000 mg by mouth every morning.      duloxetine (CYMBALTA) 60 mg capsule Take 60 mg by mouth two (2) times a day.      triamterene-hydrochlorothiazide (DYAZIDE) 37.5-25 mg per capsule Take 1 Cap by mouth daily.      ergocalciferol (VITAMIN D) 50,000 unit capsule Take 50,000 Units by mouth every seven (7) days.      cyclobenzaprine (FLEXERIL) 10 mg tablet Take 10 mg by mouth three (3) times daily as needed for Muscle Spasm(s).      azelastine (ASTELIN) 137 mcg nasal spray 1 Spray by Nasal route two (2) times a day. Administer to right and left nostril.      prednisoLONE acetate (PRED FORTE) 1 % ophthalmic suspension Administer 1 Drop to right eye every morning.         STOP taking these medications       HYDROcodone-acetaminophen (NORCO) 10-325 mg tablet Comments:   Reason for Stopping:         butalbital-acetaminophen-caffeine (FIORICET, ESGIC) 50-325-40 mg per tablet Comments:   Reason for Stopping:  per medical continuation form      -Follow up in office in 2 weeks      Signed:  Lennox Laity  MSN, APRN, FNP-C, [facility]  Orthopaedic Nurse Practitioner    05/06/2017  1:26 PM

## 2017-05-06 NOTE — Progress Notes (Signed)
Formatting of this note is different from the original.  Ortho / Neurosurgery NP Note    POD# 1  s/p RIGHT TOTAL KNEE ARTHROPLASTY WITH NAVIGATION       Pt resting in bed.   Reports she was getting to the bathroom last night when she heard an audible pop associated with pain. The immediate severe pain has subsided but she has had persistent pain in the back of the leg.   Complaints of persistent numbness - noted extensive hx of neuropathy POA- numbness inmproving today since yesterday.     VSS Afebrile.  Voiding status: + void    Labs  Lab Results   Component Value Date/Time    HGB 11.8 05/06/2017 02:58 AM     Lab Results   Component Value Date/Time    INR 1.0 04/20/2017 09:16 AM       Body mass index is 34.05 kg/(m^2). : A BMI > 30 is classified as obesity and > 40 is classified as morbid obesity.     Dressing c.d.i  Cryotherapy in place over incision  Calves soft and supple; No pain with passive stretch  Sensation and motor intact  SCDs for mechanical DVT proph while in bed    PLAN:  1) PT BID, slow progress with PT.  She has baseline balance deficits d/t Meniere's with reported near daily falls. Pt was given RW at pre-op joint class and endorses not falls but many close calls since. Min assist for toileting.   Weakness and required coaching for deep breathing and hand transfers / appropriate use of walker.  2) Aspirin 325 mg PO BID for DVT Prophylaxis   3) GI Prophylaxis - Pepcid  4) Popping in the knee- will discuss with Dr. Patrecia Pace  5) Patient concerned about swallowing and anesthesia reports that she is difficult to intubate. She is inquiring about having Dr. Robb Matar consult for esophageal dilatation. This can certainly be done as an outpatient but will discuss with Dr. Patrecia Pace.   6) Readniess for discharge:     [x]  Vital Signs stable    [x]  Hgb stable    [x]  + Voiding    [x]  Wound intact, drainage minimal    [x]  Tolerating PO intake     [x]  Pt plan- SNF   [x]  Adequate pain control on oral medication alone     To  [place] likely today.     Katherine Mccreedy, NP    Electronically signed by Charlton Haws, NP at 05/06/2017 10:23 AM EDT

## 2017-05-06 NOTE — Unmapped (Signed)
Formatting of this note is different from the original.    BRIEF OPERATIVE NOTE    Date of Procedure: 05/04/2017   Preoperative Diagnosis: OSTEOARTHRITIS  RIGHT KNEE   Postoperative Diagnosis: OSTEOARTHRITIS  RIGHT KNEE     Procedure(s):  RIGHT TOTAL KNEE ARTHROPLASTY WITH NAVIGATION  Surgeon(s) and Role:     Janece Canterbury, MD - Primary    Surgical Assistant: Straub    Surgical Staff:  Circ-1: Tsiptsis, Lanora Manis, RN  Physician Assistant: Raj Janus, PA-C  Scrub Tech-1: Leandrew Koyanagi  Scrub RN - Intern: [place], Pamela, RN  Surg Asst-1: Edmonia Caprio  Float Staff: Deal, Lucendia Herrlich, Granville Lewis  Event Time In Time Out   Incision Start 1643    Incision Close 1752      Anesthesia: General   Estimated Blood Loss: 100cc  Specimens: * No specimens in log *   Findings: as above   Complications: none  Implants:   Implant Name Type Inv. Item Serial No. Manufacturer Lot No. LRB No. Used Action   COMPNT FEM CR TRIATHLN 5 R PA --  - SN/A  COMPNT FEM CR TRIATHLN 5 R PA --  N/A [employer] HOWM [id number] Right 1 Implanted   BASEPLT TIB PC TRITNM SZ 5 -- TRIATHLON - SN/A  BASEPLT TIB PC TRITNM SZ 5 -- TRIATHLON N/A [employer] HOWM [id number] Right 1 Implanted   PAT W/PA COATING BEAD 35X10MM -- TRIATHLON - SN/A  PAT W/PA COATING BEAD 35X10MM -- TRIATHLON N/A [employer] HOWM [id number] Right 1 Implanted   TRIATHLON X3 TIBIAL HEARING INSERT -- CS   N/A  [id number] Right 1 Implanted   KNEE TTL POR/HYB TRIATHLON X3 -- K6 BSV - [id number]  KNEE TTL POR/HYB TRIATHLON X3 -- K6 BSV  [employer] HOWM  Right 1 Implanted       Electronically signed by Janece Canterbury, MD at 06/12/2017  4:12 PM EDT

## 2017-05-06 NOTE — Discharge Summary (Signed)
Formatting of this note is different from the original.  Ortho Discharge Summary    Patient ID:  Katherine [name]  [id number]  female  61 y.o.  August 21, 1956    Admit date: 05/04/2017    Discharge date: 05/06/2017    Admitting Physician: Janece Canterbury, MD     Consulting Physician(s):   Treatment Team: Attending Provider: Janece Canterbury, MD; Nurse Practitioner: Britta Mccreedy, NP; Utilization Review: Ames Coupe, RN; Nurse Practitioner: Londell Moh, NP; Care Manager: Tona Sensing    Date of Surgery:   05/04/2017     Preoperative Diagnosis:  OSTEOARTHRITIS  RIGHT KNEE     Postoperative Diagnosis:   OSTEOARTHRITIS  RIGHT KNEE     Procedure(s):     RIGHT TOTAL KNEE ARTHROPLASTY WITH NAVIGATION     Anesthesia Type:   General     Surgeon: Janece Canterbury, MD       HPI:  Pt is a 61 y.o. female who has a history of OSTEOARTHRITIS  RIGHT KNEE   with pain and limitations of activities of daily living who presents at this time for a  right TKA following the failure of conservative management.    PMH:   Past Medical History:   Diagnosis Date   ? Arthritis    ? Autoimmune disease (HCC)    ? Bowel disease    ? Burning with urination    ? Chronic pain    ? Depression    ? Diarrhea    ? Difficult intubation     difficulty breathing after awakening in past.   ? Dizziness    ? Easy bruising    ? Headache(784.0)    ? Hearing loss    ? Ill-defined condition     past hx of shingles--eye/ear 13 years ago   ? Night sweats    ? Numbness    ? Other ill-defined conditions(799.89)     shingles, lupus, menieres, Fibromyalgia   ? Sweats, menopausal    ? Visual loss      Body mass index is 34.05 kg/(m^2). BMI greater than 30 is classified as obesity.     Medications upon admission :   Prior to Admission Medications   Prescriptions Last Dose Informant Patient Reported? Taking?   HYDROcodone-acetaminophen (NORCO) 10-325 mg tablet 04/27/2017 at Unknown time  Yes Yes   Sig: Take 1 Tab by mouth two (2) times daily as needed for Pain.   POTASSIUM  CHLORIDE PO 05/04/2017 at Unknown time  Yes Yes   Sig: Take 10 mEq by mouth two (2) times a day.   azelastine (ASTELIN) 137 mcg nasal spray 05/04/2017 at Unknown time  Yes Yes   Sig: 1 Spray by Nasal route two (2) times a day. Administer to right and left nostril.   butalbital-acetaminophen-caffeine (FIORICET, ESGIC) 50-325-40 mg per tablet 05/03/2017 at Unknown time  Yes Yes   Sig: Take 2 Tabs by mouth as needed for Headache. Take 2 tables at onset of migraine, then take 1 every 4 hrs as needed for migraine   cetirizine HCl (CETIRIZINE PO) 05/04/2017 at Unknown time  Yes Yes   Sig: Take 10 mg by mouth every morning.   cyclobenzaprine (FLEXERIL) 10 mg tablet 05/04/2017 at Unknown time  Yes Yes   Sig: Take 10 mg by mouth three (3) times daily as needed for Muscle Spasm(s).   duloxetine (CYMBALTA) 60 mg capsule 05/04/2017 at Unknown time  Yes Yes   Sig: Take 60 mg by  mouth two (2) times a day.   ergocalciferol (VITAMIN D) 50,000 unit capsule 04/03/2017 at Unknown time  Yes Yes   Sig: Take 50,000 Units by mouth every seven (7) days.   flaxseed oil (OMEGA 3 PO) 04/03/2017 at Unknown time  Yes Yes   Sig: Take 1 Cap by mouth daily.   gabapentin (NEURONTIN) 300 mg capsule 05/04/2017 at Unknown time  No Yes   Sig: Take 2 Caps by mouth three (3) times daily.   hydroxychloroquine (PLAQUENIL) 200 mg tablet 05/04/2017 at Unknown time  Yes Yes   Sig: Take 200 mg by mouth two (2) times a day.   linaclotide (LINZESS) 145 mcg cap capsule 05/03/2017 at Unknown time  Yes Yes   Sig: Take 145 mcg by mouth Daily (before breakfast).   melatonin 10 mg cap 05/03/2017 at Unknown time  Yes Yes   Sig: Take 1 Cap by mouth nightly.   meloxicam (MOBIC) 15 mg tablet 05/03/2017 at Unknown time  Yes Yes   Sig: Take 15 mg by mouth daily.   nortriptyline (PAMELOR) 50 mg capsule 05/03/2017 at Unknown time  Yes Yes   Sig: Take 50 mg by mouth nightly.   prednisoLONE acetate (PRED FORTE) 1 % ophthalmic suspension 05/04/2017 at Unknown time  Yes Yes   Sig: Administer 1 Drop  to right eye every morning.   prenatal vit 91/iron/folic/dha (PRENATAL + DHA PO) 04/27/2017 at Unknown time  Yes Yes   Sig: Take 1 Tab by mouth daily.   primidone (MYSOLINE) 50 mg tablet 05/04/2017 at Unknown time  No Yes   Sig: Take 1 Tab by mouth three (3) times daily.   raNITIdine (ZANTAC) 150 mg tablet 05/04/2017 at Unknown time  Yes Yes   Sig: TAKE 1 TABLET BY MOUTH TWICE A DAY   topiramate (TOPAMAX) 200 mg tablet   Yes Yes   Sig: Take 200 mg by mouth two (2) times a day.   triamterene-hydrochlorothiazide (DYAZIDE) 37.5-25 mg per capsule 05/04/2017 at Unknown time  Yes Yes   Sig: Take 1 Cap by mouth daily.   valacyclovir (VALTREX) 1 g tablet 05/04/2017 at Unknown time  Yes Yes   Sig: Take 1,000 mg by mouth every morning.     Facility-Administered Medications: None       Allergies:    Allergies   Allergen Reactions   ? Morphine Other (comments)     "it does not stop the pain"   ? Pcn [Penicillins] Rash and Swelling       Hospital Course:  The patient underwent surgery.  Intra-operative complications: None; patient tolerated the procedure well. Was taken to the PACU in stable condition and then transferred to the ortho floor.      Perioperative Antibiotics:  Ancef     Postoperative Pain Management:  Oxycodone    DVT Prophylaxis: Aspirin 325 mg PO BID for thirty days.     Postoperative transfusions:    Number of units banked PRBCs =   none     Post Op complications: None- patient observed a popping sound in her knee and associated pain on POD #2. X-rays taken to ensure no issues with prosthesis and they look fine. Likely soft tissue pain. Dr. Patrecia Pace aware and in agreement with discharge.     Hemoglobin at discharge:    Lab Results   Component Value Date/Time    HGB 11.8 05/06/2017 02:58 AM    INR 1.0 04/20/2017 09:16 AM     Dressing - clean, dry and  intact. No significant erythema or swelling. Neurovascular exam found to be within normal limits. Wound appears to be healing without any evidence of infection. Pt had a HVAC  drain that was removed on POD# 1.      Physical Therapy started on the day following surgery and participated in bed mobility, transfers and ambulation.        Gait:  Gait  Base of Support: Widened  Speed/Cadence: Pace decreased (<100 feet/min), Slow  Step Length: Right shortened, Left shortened  Stance: Right decreased  Gait Abnormalities: Decreased step clearance, Antalgic, Step to gait  Ambulation - Level of Assistance: Contact guard assistance  Distance (ft): 60 Feet (ft)  Assistive Device: Gait belt, Walker, rolling      Discharged to: SNF    Condition on Discharge:   stable    Discharge instructions:  - Anticoagulate with Aspirin 325 mg PO BID for thirty days.   - Take pain medications as prescribed  - Resume pre hospital diet      - Discharge activity: activity as tolerated  - Ambulate (with assistive device as needed)   - Weight bearing status WBAT  - Wound Care Keep wound clean and dry.  See discharge instruction sheet.      -DISCHARGE MEDICATION LIST    Current Discharge Medication List     START taking these medications    Details   !! naloxone (NARCAN) 4 mg/actuation nasal spray 1 Spray by IntraNASal route as needed for Other (Respiratory depression). Use 1 spray intranasally into 1 nostril. Call 911. Use a new Narcan nasal spray for subsequent doses and administer into alternating nostrils. May repeat every 2 to 3 minutes as needed.  Qty: 1 Each, Refills: 0     acetaminophen (TYLENOL) 325 mg tablet Take 2 Tabs by mouth every six (6) hours for 14 days.  Qty: 112 Tab, Refills: 0     aspirin delayed-release 325 mg tablet Take 1 Tab by mouth two (2) times a day for 30 days.  Qty: 60 Tab, Refills: 0     famotidine (PEPCID) 20 mg tablet Take 1 Tab by mouth two (2) times a day for 30 days.  Qty: 60 Tab, Refills: 0     oxyCODONE IR (ROXICODONE) 5 mg immediate release tablet Take 1-2 Tabs by mouth every four (4) hours as needed. Max Daily Amount: 60 mg.  Qty: 30 Tab, Refills: 0    Associated Diagnoses: S/P total  knee replacement, right     polyethylene glycol (MIRALAX) 17 gram packet Take 1 Packet by mouth daily as needed (constipation) for up to 15 days.  Qty: 15 Packet, Refills: 0     senna-docusate (PERICOLACE) 8.6-50 mg per tablet Take 1 Tab by mouth daily.  Qty: 30 Tab, Refills: 0     !! naloxone (NARCAN) 4 mg/actuation nasal spray 1 Spray by IntraNASal route as needed (respiratory depression). Give single spray into one nostril. Call 911. Give additional doses every 2 to 3 minutes alternating nostrils until assistance arrives using a new nasal spray with each dose, if patient does not respond or responds and then relapses.  Qty: 1 Each, Refills: 0      !! - Potential duplicate medications found. Please discuss with provider.     CONTINUE these medications which have CHANGED    Details   !! LORazepam (ATIVAN) 1 mg tablet Take 1 Tab by mouth every six (6) hours as needed for Anxiety. Max Daily Amount: 4 mg.  Qty: 30 Tab, Refills:  0    Associated Diagnoses: Palpitations     !! LORazepam (ATIVAN) 2 mg tablet Take 0.5 Tabs by mouth every six (6) hours as needed for Anxiety. Max Daily Amount: 4 mg.  Qty: 10 Tab, Refills: 0    Associated Diagnoses: Adjustment disorder with mixed anxiety and depressed mood      !! - Potential duplicate medications found. Please discuss with provider.     CONTINUE these medications which have NOT CHANGED    Details   topiramate (TOPAMAX) 200 mg tablet Take 200 mg by mouth two (2) times a day.     flaxseed oil (OMEGA 3 PO) Take 1 Cap by mouth daily.     cetirizine HCl (CETIRIZINE PO) Take 10 mg by mouth every morning.     nortriptyline (PAMELOR) 50 mg capsule Take 50 mg by mouth nightly.     prenatal vit 91/iron/folic/dha (PRENATAL + DHA PO) Take 1 Tab by mouth daily.     POTASSIUM CHLORIDE PO Take 10 mEq by mouth two (2) times a day.     melatonin 10 mg cap Take 1 Cap by mouth nightly.     raNITIdine (ZANTAC) 150 mg tablet TAKE 1 TABLET BY MOUTH TWICE A DAY  Refills: 5     gabapentin  (NEURONTIN) 300 mg capsule Take 2 Caps by mouth three (3) times daily.  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     primidone (MYSOLINE) 50 mg tablet Take 1 Tab by mouth three (3) times daily.  Qty: 270 Tab, Refills: 3    Associated Diagnoses: Benign essential tremor     hydroxychloroquine (PLAQUENIL) 200 mg tablet Take 200 mg by mouth two (2) times a day.     meloxicam (MOBIC) 15 mg tablet Take 15 mg by mouth daily.     valacyclovir (VALTREX) 1 g tablet Take 1,000 mg by mouth every morning.     duloxetine (CYMBALTA) 60 mg capsule Take 60 mg by mouth two (2) times a day.     triamterene-hydrochlorothiazide (DYAZIDE) 37.5-25 mg per capsule Take 1 Cap by mouth daily.     ergocalciferol (VITAMIN D) 50,000 unit capsule Take 50,000 Units by mouth every seven (7) days.     cyclobenzaprine (FLEXERIL) 10 mg tablet Take 10 mg by mouth three (3) times daily as needed for Muscle Spasm(s).     azelastine (ASTELIN) 137 mcg nasal spray 1 Spray by Nasal route two (2) times a day. Administer to right and left nostril.     prednisoLONE acetate (PRED FORTE) 1 % ophthalmic suspension Administer 1 Drop to right eye every morning.       STOP taking these medications      HYDROcodone-acetaminophen (NORCO) 10-325 mg tablet Comments:   Reason for Stopping:       butalbital-acetaminophen-caffeine (FIORICET, ESGIC) 50-325-40 mg per tablet Comments:   Reason for Stopping:         per medical continuation form    -Follow up in office in 2 weeks    Signed:  Britta Mccreedy  MSN, APRN, FNP-C, CWOCN-AP  Orthopaedic Nurse Practitioner    05/06/2017  1:26 PM    Electronically signed by Janece Canterbury, MD at 06/24/2017 11:41 PM EDT

## 2017-05-06 NOTE — Progress Notes (Signed)
Formatting of this note is different from the original.  Problem: Mobility Impaired (Adult and Pediatric)  Goal: *Acute Goals and Plan of Care (Insert Text)  Physical Therapy Goals  Initiated 05/05/2017    1. Patient will move from supine to sit and sit to supine  in bed with independence within 4 days.  2. Patient will perform sit to stand with independence within 4 days.  3. Patient will ambulate with modified independence for 300 feet with the least restrictive device within 4 days.  4. Patient will ascend/descend 4 stairs with 1 handrail(s) with modified independence within 4 days.  5. Patient will perform home exercise program per protocol with independence within 4 days.  6. Patient will demonstrate AROM 0-90 degrees in operative joint within 4 days.    physical Therapy TREATMENT  Patient: Katherine [name] (61 y.o. 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    ASSESSMENT:  Pt cleared by RN and received supine in bed. Pt reports she heard a pop last night when getting off the commode and since then has had increased pain in RLE. Xray has been ordered to rule out acute changes. Pt ios tender to palpation on entire posterior aspect of RLE. Pt requiring increased assistance for bed mobility and transfers d/t pain. Min A at most with verbal cueing. Pt did not tolerate sitting up on chair and painful to sit on commode. Ambulated 58ft with antalgic gait and step to pattern. Endorses most pain at heel off and swing phase when bringing RLE forward. Returned to supine at end of session with all needs within reach.   Progression toward goals:  []       Improving appropriately and progressing toward goals  [x]       Improving slowly and progressing  toward goals  []       Not making progress toward goals and plan of care will be adjusted     PLAN:  Patient continues to benefit from skilled intervention to address the above impairments.  Continue treatment per established plan of care.  Discharge Recommendations:  Skilled Nursing Facility  Further Equipment Recommendations for Discharge:  TBD at rehab     SUBJECTIVE:   Patient stated ?I heard a pop and I feel so terrible for doing it now.?    OBJECTIVE DATA SUMMARY:   Critical Behavior:  Neurologic State: Alert  Orientation Level: Oriented X4      Range of Motion:                  Functional Mobility Training:  Bed Mobility:    Supine to Sit: Minimum assistance          Transfers:  Sit to Stand: Minimum assistance  Stand to Sit: Contact guard assistance                Balance:  Sitting: Intact  Standing: Intact;With support  Standing - Static: Good  Standing - Dynamic : Good  Ambulation/Gait Training:  Distance (ft): 60 Feet (ft)  Assistive Device: Gait belt;Walker, rolling  Ambulation - Level of Assistance: Contact guard assistance      Gait Abnormalities: Decreased step clearance;Antalgic;Step to gait      Base of Support: Widened  Stance: Right decreased  Speed/Cadence:  Pace decreased (<100 feet/min);Slow  Step Length: Right shortened;Left shortened              Stairs:        Therapeutic Exercises:     EXERCISE   Sets   Reps   Active Active Assist   Passive Self ROM   Comments   Ankle Pumps 2 5 [x]                                         []                                         []                                         []                                            Quad Sets 2 5 [x]                                         []                                         []                                         []                                            Hamstring Sets 2 5 [x]                                         []                                         []                                         []                                             Short Arc Quads   []                                         []                                         []                                         []   Knee Extension Stretch     []                                           []                                           []                                           []                                            Heel Slides 2 5 [x]                                         []                                         []                                         []                                            Long Arc Quads 2 5 [x]                                         []                                         []                                         []                                            Knee Flexion Stretch   []                                         []                                         []                                         []   Straight Leg Raises   []                                         []                                         []                                         []                                              Pain:  Pain Scale 1: Numeric (0 - 10)  Pain Intensity 1: 3  Pain Location 1: Knee  Pain Orientation 1: Right  Pain Description 1: Throbbing  Pain Intervention(s) 1: Medication (see MAR)  Activity Tolerance:   Good  Please refer to the flowsheet for vital signs taken during this treatment.  After treatment:   []  Patient left in no apparent distress sitting up in chair  [x]  Patient left in no apparent distress in bed  [x]  Call bell left within reach  [x]  Nursing notified  []  Caregiver present  []  Bed alarm activated    COMMUNICATION/COLLABORATION:   The patient?s plan of care was discussed with: Registered Nurse    Rhea Pink, PT   Time Calculation: 25 mins      Electronically signed by Rhea Pink, PT at 05/06/2017 12:39 PM EDT

## 2017-05-06 NOTE — Progress Notes (Signed)
Report call to [facility] prior to discharge

## 2017-05-06 NOTE — Progress Notes (Signed)
Ortho / Neurosurgery NP Note    POD# 1  s/p RIGHT TOTAL KNEE ARTHROPLASTY WITH NAVIGATION       Pt resting in bed.   Reports she was getting to the bathroom last night when she heard an audible pop associated with pain. The immediate severe pain has subsided but she has had persistent pain in the back of the leg.   Complaints of persistent numbness - noted extensive hx of neuropathy POA- numbness inmproving today since yesterday.     VSS Afebrile.  Voiding status: + void    Labs  Lab Results   Component Value Date/Time    HGB 11.8 05/06/2017 02:58 AM      Lab Results   Component Value Date/Time    INR 1.0 04/20/2017 09:16 AM        Body mass index is 34.05 kg/(m^2). : A BMI > 30 is classified as obesity and > 40 is classified as morbid obesity.     Dressing c.d.i  Cryotherapy in place over incision  Calves soft and supple; No pain with passive stretch  Sensation and motor intact  SCDs for mechanical DVT proph while in bed     PLAN:  1) PT BID, slow progress with PT.  She has baseline balance deficits d/t Meniere's with reported near daily falls. Pt was given RW at pre-op joint class and endorses not falls but many close calls since. Min assist for toileting.   Weakness and required coaching for deep breathing and hand transfers / appropriate use of walker.  2) Aspirin 325 mg PO BID for DVT Prophylaxis   3) GI Prophylaxis - Pepcid  4) Popping in the knee- will discuss with Dr. Eugenie Birks  5) Patient concerned about swallowing and anesthesia reports that she is difficult to intubate. She is inquiring about having Dr. Pieter Partridge consult for esophageal dilatation. This can certainly be done as an outpatient but will discuss with Dr. Eugenie Birks.   6) Readniess for discharge:     [x]  Vital Signs stable    [x]  Hgb stable    [x]  + Voiding    [x]  Wound intact, drainage minimal    [x]  Tolerating PO intake     [x]  Pt plan- SNF   [x]  Adequate pain control on oral medication alone     To [facility] likely today.     Lennox Laity, NP

## 2017-05-06 NOTE — Progress Notes (Signed)
Bedside and Verbal shift change report given to Christina (oncoming nurse) by Jay (offgoing nurse). Report included the following information SBAR, Kardex, MAR and Recent Results.

## 2017-05-06 NOTE — Op Note (Signed)
[place]  OPERATIVE REPORT    Name:Katherine [name], [name]  MR#: [id number]  DOB: 06/28/1956  ACCOUNT #: [id number]   DATE OF SERVICE: 05/04/2017    PREOPERATIVE DIAGNOSIS:  Right knee severe end-stage osteoarthritis.    POSTOPERATIVE DIAGNOSIS:  Right knee severe end-stage osteoarthritis.    PROCEDURE PERFORMED:  Right total knee arthroplasty with navigation.    SURGEON:  Harvin Hazel, MD    ANESTHESIA:  General.    ASSISTANT:  Scrub.    COMPLICATIONS:  None.    ESTIMATED BLOOD LOSS:  100 mL.    SPECIMENS REMOVED:  None.    IMPLANTS:  See note.    INDICATIONS:  This is a 61 year old female who failed conservative management for severe end-stage osteoarthritis in her right knee.  Her pain had progressed to the point that normal daily activities were severely impacted.  The risks, benefits, and alternatives of surgery were explained in detail, and she agreed to proceed.    TECHNIQUE:  The patient was taken to the operating room and laid supine on the table.  General anesthesia was administered.  Vancomycin was given preoperatively per protocol and a tourniquet was applied to the right thigh above the knee.  The knee was then prepped and draped free in the usual sterile fashion and a timeout was called.    Following the timeout, the limb was exsanguinated and the tourniquet was inflated to 275 mmHg.  A midline incision was carried out followed by medial parapatellar approach.  The patella was everted and 10 mm was resected off the underside.  Following this, a metallic plate was placed on the cut surface of the patella.  It was placed in a non-everted position and swept laterally.  The knee was flexed and retractors were placed to expose the distal femur.  A retractor was pinned on the end of the femur and appropriate data points entered into the computer.  Distal femoral cut was then made at 9 mm from the high side, 5 degrees of flexion, neutral varus/valgus angulation.  A chamfer guide was placed in 4  degrees of external rotation referencing the posterior condyles for a size 5 femur.  Anterior, posterior, and chamfer cuts were then made.    Following this, the ACL was resected, the tibia was subluxed forward, and a tracker pinned in the proximal tibia.  After appropriate data point entry, a proximal tibial cut was then made at 8 mm from the high side, 3 degrees posterior slope, neutral varus/valgus angulation.  A laminar spreader was then placed at 90 degrees and the medial and lateral menisci were removed, and the posterior capsule was released and cauterized, and infiltrated with Marcaine with epinephrine, Toradol, and morphine.  The knee was trialed with a 5 femur and 5 tibia with a CS insert, which gave excellent stability while maintaining full range of motion.  The tray was pinned in place and the lug holes were drilled for the femur, which was then removed along with the insert, and the proximal tibia was punched to accept a press-fit size 5 Tritanium tibia.  All trial components were removed.  The raw bone surfaces were copiously irrigated.  A final size 5 press-fit tibial tray, Tritanium, was impacted into position.  A 5 femur was impacted into position as well.  A CS insert was placed and the knee was brought out into extension.  The patella was drilled for a 35 press-fit patella, which was impacted into position as well.  There was excellent  patellofemoral tracking and good stability, and full range of motion achieved.  The remainder of 3 liters of pulsatile lavage was irrigated through the joint.  Tourniquet was let down.  Bleeders were cauterized as encountered.  FloSeal was used.  The extensor mechanism was closed with interrupted #1 Vicryl, the subcu with 2-0 Vicryl, and the skin closed with staples.  Sterile dressing was applied followed by an Ace wrap.  The patient tolerated the procedure well and was stable to recovery room in satisfactory condition without complication.       Katherine Latus, MD       RDW / DN  D: 06/12/2017 16:22     T: 06/13/2017 07:39  JOB #: [id number]

## 2017-05-06 NOTE — Progress Notes (Signed)
Problem: Mobility Impaired (Adult and Pediatric)  Goal: *Acute Goals and Plan of Care (Insert Text)  Physical Therapy Goals  Initiated 05/05/2017    1. Patient will move from supine to sit and sit to supine  in bed with independence within 4 days.  2. Patient will perform sit to stand with independence within 4 days.  3. Patient will ambulate with modified independence for 300 feet with the least restrictive device within 4 days.  4. Patient will ascend/descend 4 stairs with 1 handrail(s) with modified independence within 4 days.  5. Patient will perform home exercise program per protocol with independence within 4 days.  6. Patient will demonstrate AROM 0-90 degrees in operative joint within 4 days.     physical Therapy TREATMENT  Patient: Katherine [name] (61 y.o. 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    ASSESSMENT:  Pt cleared by RN and received supine in bed. Pt reports she heard a pop last night when getting off the commode and since then has had increased pain in RLE. Xray has been ordered to rule out acute changes. Pt ios tender to palpation on entire posterior aspect of RLE. Pt requiring increased assistance for bed mobility and transfers d/t pain. Min A at most with verbal cueing. Pt did not tolerate sitting up on chair and painful to sit on commode. Ambulated 102ft with antalgic gait and step to pattern. Endorses most pain at heel off and swing phase when bringing RLE forward. Returned to supine at end of session with all needs within reach.   Progression toward goals:  []       Improving appropriately and progressing toward goals  [x]       Improving slowly and progressing toward goals   []       Not making progress toward goals and plan of care will be adjusted     PLAN:  Patient continues to benefit from skilled intervention to address the above impairments.  Continue treatment per established plan of care.  Discharge Recommendations:  Skilled Nursing Facility  Further Equipment Recommendations for Discharge:  TBD at rehab     SUBJECTIVE:   Patient stated ???I heard a pop and I feel so terrible for doing it now.???    OBJECTIVE DATA SUMMARY:   Critical Behavior:  Neurologic State: Alert  Orientation Level: Oriented X4        Range of Motion:                          Functional Mobility Training:  Bed Mobility:     Supine to Sit: Minimum assistance              Transfers:  Sit to Stand: Minimum assistance  Stand to Sit: Contact guard assistance                             Balance:  Sitting: Intact  Standing: Intact;With support  Standing - Static: Good  Standing - Dynamic : Good  Ambulation/Gait Training:  Distance (ft): 60 Feet (ft)  Assistive Device: Gait belt;Walker, rolling  Ambulation - Level of Assistance: Contact guard assistance        Gait  Abnormalities: Decreased step clearance;Antalgic;Step to gait        Base of Support: Widened  Stance: Right decreased  Speed/Cadence: Pace decreased (<100 feet/min);Slow  Step Length: Right shortened;Left shortened                    Stairs:           Therapeutic Exercises:     EXERCISE   Sets   Reps   Active Active Assist   Passive Self ROM   Comments   Ankle Pumps 2 5 [x]                                         []                                         []                                         []                                            Quad Sets 2 5 [x]                                         []                                         []                                         []                                            Hamstring Sets 2 5 [x]                                         []                                          []                                         []                                            Short Arc Quads   []                                         []                                         []                                         []   Knee Extension Stretch     []                                           []                                           []                                           []                                            Heel Slides 2 5 [x]                                         []                                         []                                         []                                            Long Arc Quads 2 5 [x]                                         []                                         []                                         []                                            Knee Flexion Stretch   []                                         []                                         []                                         []   Straight Leg Raises   []                                         []                                         []                                         []                                              Pain:  Pain Scale 1: Numeric (0 - 10)  Pain Intensity 1: 3  Pain Location 1: Knee  Pain Orientation 1: Right  Pain Description 1: Throbbing  Pain Intervention(s) 1: Medication (see MAR)  Activity Tolerance:   Good  Please refer to the flowsheet for vital signs taken during this treatment.  After treatment:   []  Patient left in no apparent distress sitting up in chair  [x]  Patient left in no apparent distress in bed  [x]  Call bell left within reach  [x]  Nursing notified  []  Caregiver present  []  Bed alarm activated    COMMUNICATION/COLLABORATION:   The patient???s plan of care was discussed with: Registered Nurse    Gean Birchwood, PT   Time Calculation: 25 mins

## 2017-05-06 NOTE — Progress Notes (Incomplete)
{  [facility] BEDSIDE_VERBAL_RECORDED_WRITTEN:19080::"Bedside"} shift change report given to *** (oncoming nurse) by *** (offgoing nurse). Report included the following information {[facility]AR REPORTS LIST:18992}.

## 2017-05-06 NOTE — Progress Notes (Signed)
Pt will discharge to [facility] Call report number for floor nurse is [phone] bed assignment is [id number];     Pt family will transport at discharge. There were no additional discharge needs.     Care Management Interventions  PCP Verified by CM: Yes  Mode of Transport at Discharge: Other (see comment)  Transition of Care Consult (CM Consult): SNF  Partner SNF: Yes  Discharge Durable Medical Equipment: No  Health Maintenance Reviewed: Yes  Physical Therapy Consult: Yes  Occupational Therapy Consult: No  Speech Therapy Consult: No  Current Support Network: Own Home, Lives Alone  Confirm Follow Up Transport: Family  Plan discussed with Pt/Family/Caregiver: Yes  Freedom of Choice Offered: Yes  Discharge Location  Discharge Placement: Skilled nursing facility    Stacy, MSW, [place] Manager   [phone]

## 2017-05-06 NOTE — Discharge Summary (Signed)
Ortho Discharge Summary    Patient ID:  Katherine [name]  [id number]  female  61 y.o.  06-Dec-1955    Admit date: 05/04/2017    Discharge date: 05/06/2017    Admitting Physician: Marlana Latus, MD     Consulting Physician(s):   Treatment Team: Attending Provider: Marlana Latus, MD; Nurse Practitioner: Lennox Laity, NP; Utilization Review: Heywood Bene, RN; Nurse Practitioner: Nance Pew, NP; Care Manager: Merlene Laughter    Date of Surgery:   05/04/2017     Preoperative Diagnosis:  OSTEOARTHRITIS  RIGHT KNEE     Postoperative Diagnosis:   OSTEOARTHRITIS  RIGHT KNEE     Procedure(s):     RIGHT TOTAL KNEE ARTHROPLASTY WITH NAVIGATION     Anesthesia Type:   General     Surgeon: Marlana Latus, MD                            HPI:  Pt is a 61 y.o. female who has a history of OSTEOARTHRITIS  RIGHT KNEE   with pain and limitations of activities of daily living who presents at this time for a  right TKA following the failure of conservative management.    PMH:   Past Medical History:   Diagnosis Date   ??? Arthritis    ??? Autoimmune disease ([place])    ??? Bowel disease    ??? Burning with urination    ??? Chronic pain    ??? Depression    ??? Diarrhea    ??? Difficult intubation     difficulty breathing after awakening in past.   ??? Dizziness    ??? Easy bruising    ??? Headache(784.0)    ??? Hearing loss    ??? Ill-defined condition     past hx of shingles--eye/ear 13 years ago   ??? Night sweats    ??? Numbness    ??? Other ill-defined conditions(799.89)     shingles, lupus, menieres, Fibromyalgia   ??? Sweats, menopausal    ??? Visual loss        Body mass index is 34.05 kg/(m^2). BMI greater than 30 is classified as obesity.     Medications upon admission :   Prior to Admission Medications   Prescriptions Last Dose Informant Patient Reported? Taking?   HYDROcodone-acetaminophen (NORCO) 10-325 mg tablet 04/27/2017 at Unknown time  Yes Yes   Sig: Take 1 Tab by mouth two (2) times daily as needed for Pain.    POTASSIUM CHLORIDE PO 05/04/2017 at Unknown time  Yes Yes   Sig: Take 10 mEq by mouth two (2) times a day.   azelastine (ASTELIN) 137 mcg nasal spray 05/04/2017 at Unknown time  Yes Yes   Sig: 1 Spray by Nasal route two (2) times a day. Administer to right and left nostril.   butalbital-acetaminophen-caffeine (FIORICET, ESGIC) 50-325-40 mg per tablet 05/03/2017 at Unknown time  Yes Yes   Sig: Take 2 Tabs by mouth as needed for Headache. Take 2 tables at onset of migraine, then take 1 every 4 hrs as needed for migraine   cetirizine HCl (CETIRIZINE PO) 05/04/2017 at Unknown time  Yes Yes   Sig: Take 10 mg by mouth every morning.   cyclobenzaprine (FLEXERIL) 10 mg tablet 05/04/2017 at Unknown time  Yes Yes   Sig: Take 10 mg by mouth three (3) times daily as needed for Muscle Spasm(s).   duloxetine (CYMBALTA) 60 mg capsule 05/04/2017  at Unknown time  Yes Yes   Sig: Take 60 mg by mouth two (2) times a day.   ergocalciferol (VITAMIN D) 50,000 unit capsule 04/03/2017 at Unknown time  Yes Yes   Sig: Take 50,000 Units by mouth every seven (7) days.   flaxseed oil (OMEGA 3 PO) 04/03/2017 at Unknown time  Yes Yes   Sig: Take 1 Cap by mouth daily.   gabapentin (NEURONTIN) 300 mg capsule 05/04/2017 at Unknown time  No Yes   Sig: Take 2 Caps by mouth three (3) times daily.   hydroxychloroquine (PLAQUENIL) 200 mg tablet 05/04/2017 at Unknown time  Yes Yes   Sig: Take 200 mg by mouth two (2) times a day.   linaclotide (LINZESS) 145 mcg cap capsule 05/03/2017 at Unknown time  Yes Yes   Sig: Take 145 mcg by mouth Daily (before breakfast).   melatonin 10 mg cap 05/03/2017 at Unknown time  Yes Yes   Sig: Take 1 Cap by mouth nightly.   meloxicam (MOBIC) 15 mg tablet 05/03/2017 at Unknown time  Yes Yes   Sig: Take 15 mg by mouth daily.   nortriptyline (PAMELOR) 50 mg capsule 05/03/2017 at Unknown time  Yes Yes   Sig: Take 50 mg by mouth nightly.   prednisoLONE acetate (PRED FORTE) 1 % ophthalmic suspension 05/04/2017 at Unknown time  Yes Yes    Sig: Administer 1 Drop to right eye every morning.   prenatal vit 91/iron/folic/dha (PRENATAL + DHA PO) 04/27/2017 at Unknown time  Yes Yes   Sig: Take 1 Tab by mouth daily.   primidone (MYSOLINE) 50 mg tablet 05/04/2017 at Unknown time  No Yes   Sig: Take 1 Tab by mouth three (3) times daily.   raNITIdine (ZANTAC) 150 mg tablet 05/04/2017 at Unknown time  Yes Yes   Sig: TAKE 1 TABLET BY MOUTH TWICE A DAY   topiramate (TOPAMAX) 200 mg tablet   Yes Yes   Sig: Take 200 mg by mouth two (2) times a day.   triamterene-hydrochlorothiazide (DYAZIDE) 37.5-25 mg per capsule 05/04/2017 at Unknown time  Yes Yes   Sig: Take 1 Cap by mouth daily.   valacyclovir (VALTREX) 1 g tablet 05/04/2017 at Unknown time  Yes Yes   Sig: Take 1,000 mg by mouth every morning.      Facility-Administered Medications: None        Allergies:    Allergies   Allergen Reactions   ??? Morphine Other (comments)     "it does not stop the pain"   ??? Pcn [Penicillins] Rash and Swelling        Hospital Course:  The patient underwent surgery.  Intra-operative complications: None; patient tolerated the procedure well. Was taken to the PACU in stable condition and then transferred to the ortho floor.      Perioperative Antibiotics:  Ancef     Postoperative Pain Management:  Oxycodone    DVT Prophylaxis: Aspirin 325 mg PO BID for thirty days.     Postoperative transfusions:    Number of units banked PRBCs =   none     Post Op complications: None- patient observed a popping sound in her knee and associated pain on POD #2. X-rays taken to ensure no issues with prosthesis and they look fine. Likely soft tissue pain. Dr. Eugenie Birks aware and in agreement with discharge.     Hemoglobin at discharge:    Lab Results   Component Value Date/Time    HGB 11.8 05/06/2017 02:58 AM  INR 1.0 04/20/2017 09:16 AM       Dressing - clean, dry and intact. No significant erythema or swelling. Neurovascular exam found to be within normal limits. Wound appears to be  healing without any evidence of infection. Pt had a HVAC drain that was removed on POD# 1.      Physical Therapy started on the day following surgery and participated in bed mobility, transfers and ambulation.        Gait:  Gait  Base of Support: Widened  Speed/Cadence: Pace decreased (<100 feet/min), Slow  Step Length: Right shortened, Left shortened  Stance: Right decreased  Gait Abnormalities: Decreased step clearance, Antalgic, Step to gait  Ambulation - Level of Assistance: Contact guard assistance  Distance (ft): 60 Feet (ft)  Assistive Device: Gait belt, Walker, rolling                   Discharged to: SNF    Condition on Discharge:   stable    Discharge instructions:  - Anticoagulate with Aspirin 325 mg PO BID for thirty days.   - Take pain medications as prescribed  - Resume pre hospital diet      - Discharge activity: activity as tolerated  - Ambulate (with assistive device as needed)   - Weight bearing status WBAT  - Wound Care Keep wound clean and dry.  See discharge instruction sheet.            -DISCHARGE MEDICATION LIST     Current Discharge Medication List      START taking these medications    Details   !! naloxone (NARCAN) 4 mg/actuation nasal spray 1 Spray by IntraNASal route as needed for Other (Respiratory depression). Use 1 spray intranasally into 1 nostril. Call 911. Use a new Narcan nasal spray for subsequent doses and administer into alternating nostrils. May repeat every 2 to 3 minutes as needed.  Qty: 1 Each, Refills: 0      acetaminophen (TYLENOL) 325 mg tablet Take 2 Tabs by mouth every six (6) hours for 14 days.  Qty: 112 Tab, Refills: 0      aspirin delayed-release 325 mg tablet Take 1 Tab by mouth two (2) times a day for 30 days.  Qty: 60 Tab, Refills: 0      famotidine (PEPCID) 20 mg tablet Take 1 Tab by mouth two (2) times a day for 30 days.  Qty: 60 Tab, Refills: 0      oxyCODONE IR (ROXICODONE) 5 mg immediate release tablet Take 1-2 Tabs by  mouth every four (4) hours as needed. Max Daily Amount: 60 mg.  Qty: 30 Tab, Refills: 0    Associated Diagnoses: S/P total knee replacement, right      polyethylene glycol (MIRALAX) 17 gram packet Take 1 Packet by mouth daily as needed (constipation) for up to 15 days.  Qty: 15 Packet, Refills: 0      senna-docusate (PERICOLACE) 8.6-50 mg per tablet Take 1 Tab by mouth daily.  Qty: 30 Tab, Refills: 0      !! naloxone (NARCAN) 4 mg/actuation nasal spray 1 Spray by IntraNASal route as needed (respiratory depression). Give single spray into one nostril. Call 911. Give additional doses every 2 to 3 minutes alternating nostrils until assistance arrives using a new nasal spray with each dose, if patient does not respond or responds and then relapses.  Qty: 1 Each, Refills: 0       !! - Potential duplicate medications found. Please discuss with provider.  CONTINUE these medications which have CHANGED    Details   !! LORazepam (ATIVAN) 1 mg tablet Take 1 Tab by mouth every six (6) hours as needed for Anxiety. Max Daily Amount: 4 mg.  Qty: 30 Tab, Refills: 0    Associated Diagnoses: Palpitations      !! LORazepam (ATIVAN) 2 mg tablet Take 0.5 Tabs by mouth every six (6) hours as needed for Anxiety. Max Daily Amount: 4 mg.  Qty: 10 Tab, Refills: 0    Associated Diagnoses: Adjustment disorder with mixed anxiety and depressed mood       !! - Potential duplicate medications found. Please discuss with provider.      CONTINUE these medications which have NOT CHANGED    Details   topiramate (TOPAMAX) 200 mg tablet Take 200 mg by mouth two (2) times a day.      flaxseed oil (OMEGA 3 PO) Take 1 Cap by mouth daily.      cetirizine HCl (CETIRIZINE PO) Take 10 mg by mouth every morning.      nortriptyline (PAMELOR) 50 mg capsule Take 50 mg by mouth nightly.      prenatal vit 91/iron/folic/dha (PRENATAL + DHA PO) Take 1 Tab by mouth daily.      POTASSIUM CHLORIDE PO Take 10 mEq by mouth two (2) times a day.       melatonin 10 mg cap Take 1 Cap by mouth nightly.      raNITIdine (ZANTAC) 150 mg tablet TAKE 1 TABLET BY MOUTH TWICE A DAY  Refills: 5      gabapentin (NEURONTIN) 300 mg capsule Take 2 Caps by mouth three (3) times daily.  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      primidone (MYSOLINE) 50 mg tablet Take 1 Tab by mouth three (3) times daily.  Qty: 270 Tab, Refills: 3    Associated Diagnoses: Benign essential tremor      hydroxychloroquine (PLAQUENIL) 200 mg tablet Take 200 mg by mouth two (2) times a day.      meloxicam (MOBIC) 15 mg tablet Take 15 mg by mouth daily.      valacyclovir (VALTREX) 1 g tablet Take 1,000 mg by mouth every morning.      duloxetine (CYMBALTA) 60 mg capsule Take 60 mg by mouth two (2) times a day.      triamterene-hydrochlorothiazide (DYAZIDE) 37.5-25 mg per capsule Take 1 Cap by mouth daily.      ergocalciferol (VITAMIN D) 50,000 unit capsule Take 50,000 Units by mouth every seven (7) days.      cyclobenzaprine (FLEXERIL) 10 mg tablet Take 10 mg by mouth three (3) times daily as needed for Muscle Spasm(s).      azelastine (ASTELIN) 137 mcg nasal spray 1 Spray by Nasal route two (2) times a day. Administer to right and left nostril.      prednisoLONE acetate (PRED FORTE) 1 % ophthalmic suspension Administer 1 Drop to right eye every morning.         STOP taking these medications       HYDROcodone-acetaminophen (NORCO) 10-325 mg tablet Comments:   Reason for Stopping:         butalbital-acetaminophen-caffeine (FIORICET, ESGIC) 50-325-40 mg per tablet Comments:   Reason for Stopping:  per medical continuation form      -Follow up in office in 2 weeks      Signed:  Lennox Laity  MSN, APRN, FNP-C, [facility]  Orthopaedic Nurse Practitioner     05/06/2017  1:26 PM

## 2017-05-06 NOTE — Brief Op Note (Signed)
BRIEF OPERATIVE NOTE    Date of Procedure: 05/04/2017   Preoperative Diagnosis: OSTEOARTHRITIS  RIGHT KNEE   Postoperative Diagnosis: OSTEOARTHRITIS  RIGHT KNEE     Procedure(s):  RIGHT TOTAL KNEE ARTHROPLASTY WITH NAVIGATION  Surgeon(s) and Role:     Marlana Latus, MD - Primary         Surgical Assistant: Straub    Surgical Staff:  Circ-1: Tsiptsis, Benjamine Mola, RN  Physician Assistant: Ezra Sites, PA-C  Scrub Tech-1: Coralie Carpen  Scrub RN - Intern: [place], Pamela, RN  Surg Asst-1: Dede Query  Float Staff: Deal, Percell Boston, Leanor Kail  Event Time In Time Out   Incision Start 1643    Incision Close 1752      Anesthesia: General   Estimated Blood Loss: 100cc  Specimens: * No specimens in log *   Findings: as above   Complications: none  Implants:   Implant Name Type Inv. Item Serial No. Manufacturer Lot No. LRB No. Used Action   COMPNT FEM CR TRIATHLN 5 R PA --  - SN/A  COMPNT FEM CR TRIATHLN 5 R PA --  N/A [employer] HOWM [id number] Right 1 Implanted   BASEPLT TIB PC TRITNM SZ 5 -- TRIATHLON - SN/A  BASEPLT TIB PC TRITNM SZ 5 -- TRIATHLON N/A [employer] HOWM [id number] Right 1 Implanted   PAT W/PA COATING BEAD 35X10MM -- TRIATHLON - SN/A  PAT W/PA COATING BEAD 35X10MM -- TRIATHLON N/A [employer] HOWM [id number] Right 1 Implanted   TRIATHLON X3 TIBIAL HEARING INSERT -- CS   N/A  [id number] Right 1 Implanted   KNEE TTL POR/HYB TRIATHLON X3 -- K6 BSV - [id number]  KNEE TTL POR/HYB TRIATHLON X3 -- K6 BSV  [employer] HOWM  Right 1 Implanted

## 2017-05-13 NOTE — Progress Notes (Signed)
Community Care Team documentation for patient in [place]  Initial Follow Up       Patient was admitted to [facility] on 05/04/17 and discharged 05/06/17 to [facility] [place].      Hospital Discharge diagnosis:  Osteoarthritis right knee, s/p right total knee arthroplasty with navigation    RRAT score: 8  Advance Care Planning:  Not on file.    PCP : Danella Penton, MD      Spoke with SNF team. Ensured patient arrived to SNF safely with admission packet in order.   PT/OT providing skilled therapy in SNF.  Currently ambulating 75 ft with rolling walker and supervision; supervision with bed mobility; supervision to contact guard assist with transfers.  Doppler to right neck due to swelling was negative.  Discussed need for follow up appointments with ortho, Harvin Hazel, MD, 2 weeks; GI Christin Bach, MD, in 1 month for Concern for difficult intubation and pre-existing swallowing difficulty, and provided this in writing to SNF.  Plan for discharge 9/23 to home alone with [facility].    Community Care Team will follow up weekly with [place] until discharge.  Medications were not reconciled and general patient assessment was not completed during this [place] outreach.      Fabian Sharp, MSN, RN, ACNS-BC, CCM  Nurse Navigator, [employer] [phone]

## 2017-05-20 NOTE — Progress Notes (Signed)
Community Care Team Documentation for Patient in [place]  Discharge Note    Patient has been discharged from [facility]  See previous [facility] Team notes.    PCP : Danella Penton, MD    Spoke with SNF team.  Confirmed pt discharged 9/23 with [facility] outpatient therapy.     Community Care Team will sign off at this time.  Medications were not reconciled and general patient assessment was not completed during this skilled nursing facility outreach.

## 2017-06-11 ENCOUNTER — Encounter

## 2017-06-11 ENCOUNTER — Inpatient Hospital Stay: Admit: 2017-06-11 | Payer: MEDICARE | Attending: Family Medicine | Primary: Family Medicine

## 2017-06-11 ENCOUNTER — Ambulatory Visit

## 2017-06-11 DIAGNOSIS — M79604 Pain in right leg: Secondary | ICD-10-CM

## 2017-09-10 ENCOUNTER — Encounter

## 2017-09-28 ENCOUNTER — Ambulatory Visit: Payer: MEDICARE | Primary: Family Medicine

## 2017-10-12 ENCOUNTER — Inpatient Hospital Stay: Admit: 2017-10-12 | Payer: MEDICARE | Attending: Family Medicine | Primary: Family Medicine

## 2017-10-12 DIAGNOSIS — Z1231 Encounter for screening mammogram for malignant neoplasm of breast: Secondary | ICD-10-CM

## 2018-02-24 ENCOUNTER — Encounter

## 2018-03-01 NOTE — Interval H&P Note (Signed)
 [facility]  Ambulatory Surgery Unit  Pre-operative Instructions for Endo Procedures    Procedure Date  Friday, March 05, 2018            Tentative Arrival Time 1315      1. On the day of your procedure, please report to the Ambulatory Surgery Unit Registration Desk and sign in at your designated time. The Ambulatory Surgery Unit is located in [facility] on the [place] side of the hospital across from the Ortho   building. Please have all of your health insurance cards and a photo ID.    2. You must have someone with you to drive you home, as you should not drive a car for 24 hours following anesthesia. Please make arrangements for a responsible adult friend or family member to stay with you for at least the first 24 hours after your procedure.    3. Do not have anything to eat or drink (including water , gum, mints, coffee, juice) after 11:59 PM, Thursday. This may not apply to medications prescribed by your physician.  (Please note below the special instructions with medications to take the morning of your procedure.)    4. If applicable, follow the clear liquid diet and bowel prep instructions provided by your physician's office. If you do not have this information, or have any questions, please contact your physician's office.     5. We recommend you do not drink any alcoholic beverages for 24 hours before and after your procedure.    6. Contact your surgeon's office for instructions on the following medications: non-steroidal anti-inflammatory drugs (i.e. Advil, Aleve), vitamins, and supplements. (Some surgeon's will want you to stop these medications prior to surgery and others may allow you to take them)   **If you are currently taking Plavix, Coumadin, Aspirin  and/or other blood-thinning agents, contact your surgeon for instructions.** Your surgeon will partner with the physician prescribing these medications to determine if it is safe to stop or if you need to continue taking.  Please do not stop taking these medications without instructions from your surgeon.     7. In an effort to help prevent surgical site infection, we ask that you shower with an anti-bacterial soap (i.e. Dial or Safeguard) on the morning of your procedure. Do not apply any lotions, powders, or deodorants after showering.    8. Wear comfortable clothes. Wear glasses instead of contacts. Do not bring any jewelry or money (other than copays or fees as instructed). Do not wear make-up, particularly mascara, the morning of your procedure. Wear your hair loose or down, no ponytails, buns, bobby pins or clips. All body piercings must be removed.      9. You should understand that if you do not follow these instructions your procedure may be cancelled. If your physical condition changes (i.e. fever, cold or flu) please contact your surgeon as soon as possible.    10. It is important that you be on time. If a situation occurs where you may be late, or if you have any questions or problems, please call [phone].    11. Your procedure time may be subject to change. You will receive a phone call the day prior to confirm your arrival time.      Special Instructions:    Take all medications and inhalers, as prescribed, on the morning of surgery with a sip of water .      Insulin Dependent Diabetic patients: Take your diabetic medications as prescribed the day before  surgery.  Hold all diabetic medications the day of surgery.    If you are scheduled to arrive for surgery after 8:00 AM, and your AM blood sugar is >200, please call Ambulatory Surgery.      I understand a pre-operative phone call will be made to verify my procedure time. In the event that I am not available, I give permission for a message to be left on my answering service and/or with another person?      yes    Preop instructions reviewed  Pt verbalized understanding.      ___________________      ___________________      ___________________  (Signature of  Patient)          (Witness)                   (Date and Time)

## 2018-03-01 NOTE — Other (Signed)
[facility]  Ambulatory Surgery Unit  Pre-operative Instructions for Endo Procedures    Procedure Date  Friday, March 05, 2018            Tentative Arrival Time 1315      1. On the day of your procedure, please report to the Ambulatory Surgery Unit Registration Desk and sign in at your designated time. The Ambulatory Surgery Unit is located in [facility] on the [place] side of the hospital across from the [place] building. Please have all of your health insurance cards and a photo ID.    2. You must have someone with you to drive you home, as you should not drive a car for 24 hours following anesthesia. Please make arrangements for a responsible adult friend or family member to stay with you for at least the first 24 hours after your procedure.    3. Do not have anything to eat or drink (including water, gum, mints, coffee, juice) after 11:59 PM, Thursday. This may not apply to medications prescribed by your physician.  (Please note below the special instructions with medications to take the morning of your procedure.)    4. If applicable, follow the clear liquid diet and bowel prep instructions provided by your physician's office. If you do not have this information, or have any questions, please contact your physician's office.     5. We recommend you do not drink any alcoholic beverages for 24 hours before and after your procedure.    6. Contact your surgeon???s office for instructions on the following medications: non-steroidal anti-inflammatory drugs (i.e. Advil, Aleve), vitamins, and supplements. (Some surgeon???s will want you to stop these medications prior to surgery and others may allow you to take them)   **If you are currently taking Plavix, Coumadin, Aspirin and/or other blood-thinning agents, contact your surgeon for instructions.** Your surgeon will partner with the physician prescribing these medications to  determine if it is safe to stop or if you need to continue taking. Please do not stop taking these medications without instructions from your surgeon.     7. In an effort to help prevent surgical site infection, we ask that you shower with an anti-bacterial soap (i.e. Dial or Safeguard) on the morning of your procedure. Do not apply any lotions, powders, or deodorants after showering.    8. Wear comfortable clothes. Wear glasses instead of contacts. Do not bring any jewelry or money (other than copays or fees as instructed). Do not wear make-up, particularly mascara, the morning of your procedure. Wear your hair loose or down, no ponytails, buns, bobby pins or clips. All body piercings must be removed.      9. You should understand that if you do not follow these instructions your procedure may be cancelled. If your physical condition changes (i.e. fever, cold or flu) please contact your surgeon as soon as possible.    10. It is important that you be on time. If a situation occurs where you may be late, or if you have any questions or problems, please call [phone].    11. Your procedure time may be subject to change. You will receive a phone call the day prior to confirm your arrival time.      Special Instructions:    Take all medications and inhalers, as prescribed, on the morning of surgery with a sip of water.      Insulin Dependent Diabetic patients: Take your diabetic medications as prescribed the day before  surgery.  Hold all diabetic medications the day of surgery.    If you are scheduled to arrive for surgery after 8:00 AM, and your AM blood sugar is >200, please call Ambulatory Surgery.      I understand a pre-operative phone call will be made to verify my procedure time. In the event that I am not available, I give permission for a message to be left on my answering service and/or with another person?      yes    Preop instructions reviewed  Pt verbalized understanding.       ___________________      ___________________      ___________________  (Signature of Patient)          (Witness)                   (Date and Time)

## 2018-03-03 ENCOUNTER — Inpatient Hospital Stay: Admit: 2018-03-03 | Payer: MEDICARE | Attending: Physical Medicine & Rehabilitation | Primary: Family Medicine

## 2018-03-03 DIAGNOSIS — M5136 Other intervertebral disc degeneration, lumbar region: Secondary | ICD-10-CM

## 2018-03-05 ENCOUNTER — Inpatient Hospital Stay: Payer: MEDICARE

## 2018-03-05 LAB — POC H. PYLORI, TISSUE
H pylori from tissue: NEGATIVE
H. pylori from tissue: NEGATIVE
Kit lot no.: 157108 NA
Lot no.: 157108
NEGATIVE CONTROL: NEGATIVE
Negative control: NEGATIVE
POSITIVE CONTROL: POSITIVE
Positive control: POSITIVE

## 2018-03-05 MED ORDER — ONDANSETRON (PF) 4 MG/2 ML INJECTION
4 mg/2 mL | INTRAMUSCULAR | Status: DC | PRN
Start: 2018-03-05 — End: 2018-03-05

## 2018-03-05 MED ORDER — SODIUM CHLORIDE 0.9 % IJ SYRG
Freq: Three times a day (TID) | INTRAMUSCULAR | Status: DC
Start: 2018-03-05 — End: 2018-03-05

## 2018-03-05 MED ORDER — SODIUM CHLORIDE 0.9 % IJ SYRG
INTRAMUSCULAR | Status: DC | PRN
Start: 2018-03-05 — End: 2018-03-05

## 2018-03-05 MED ORDER — LACTATED RINGERS IV
INTRAVENOUS | Status: DC
Start: 2018-03-05 — End: 2018-03-05
  Administered 2018-03-05: 17:00:00 via INTRAVENOUS

## 2018-03-05 MED ORDER — FENTANYL CITRATE (PF) 50 MCG/ML IJ SOLN
50 mcg/mL | INTRAMUSCULAR | Status: AC | PRN
Start: 2018-03-05 — End: 2018-03-05
  Administered 2018-03-05: 18:00:00 via INTRAVENOUS

## 2018-03-05 MED ORDER — DIPHENHYDRAMINE HCL 50 MG/ML IJ SOLN
50 mg/mL | INTRAMUSCULAR | Status: DC | PRN
Start: 2018-03-05 — End: 2018-03-05

## 2018-03-05 MED ORDER — LACTATED RINGERS IV
INTRAVENOUS | Status: DC
Start: 2018-03-05 — End: 2018-03-05

## 2018-03-05 MED ORDER — LIDOCAINE (PF) 20 MG/ML (2 %) IJ SOLN
20 mg/mL (2 %) | INTRAMUSCULAR | Status: DC | PRN
Start: 2018-03-05 — End: 2018-03-05
  Administered 2018-03-05: 18:00:00 via INTRAVENOUS

## 2018-03-05 MED ORDER — LIDOCAINE (PF) 10 MG/ML (1 %) IJ SOLN
10 mg/mL (1 %) | INTRAMUSCULAR | Status: DC | PRN
Start: 2018-03-05 — End: 2018-03-05

## 2018-03-05 MED ORDER — FENTANYL CITRATE (PF) 50 MCG/ML IJ SOLN
50 mcg/mL | INTRAMUSCULAR | Status: AC
Start: 2018-03-05 — End: 2018-03-05
  Administered 2018-03-05: 17:00:00 via INTRAVENOUS

## 2018-03-05 MED ORDER — FENTANYL CITRATE (PF) 50 MCG/ML IJ SOLN
50 mcg/mL | INTRAMUSCULAR | Status: DC | PRN
Start: 2018-03-05 — End: 2018-03-05

## 2018-03-05 MED ORDER — PROPOFOL 10 MG/ML IV EMUL
10 mg/mL | INTRAVENOUS | Status: DC | PRN
Start: 2018-03-05 — End: 2018-03-05
  Administered 2018-03-05 (×2): via INTRAVENOUS

## 2018-03-05 MED FILL — LACTATED RINGERS IV: INTRAVENOUS | Qty: 1000

## 2018-03-05 MED FILL — FENTANYL CITRATE (PF) 50 MCG/ML IJ SOLN: 50 mcg/mL | INTRAMUSCULAR | Qty: 2

## 2018-03-05 NOTE — Anesthesia Post-Procedure Evaluation (Signed)
Procedure(s):  ESOPHAGOGASTRODUODENOSCOPY (EGD)  ENDOSCOPIC BRUSHING  ESOPHAGOGASTRODUODENAL (EGD) BIOPSY.    general, total IV anesthesia    Anesthesia Post Evaluation      Multimodal analgesia: multimodal analgesia not used between 6 hours prior to anesthesia start to PACU discharge  Patient location during evaluation: bedside  Patient participation: complete - patient participated  Level of consciousness: awake  Pain score: 3  Airway patency: patent  Anesthetic complications: no  Cardiovascular status: acceptable  Respiratory status: acceptable  Hydration status: acceptable  Post anesthesia nausea and vomiting:  none      No vitals data found for the desired time range.

## 2018-03-05 NOTE — Interval H&P Note (Signed)
Medicated per Dr. Ruthann Cancer. CMx2

## 2018-03-05 NOTE — Interval H&P Note (Signed)
1433 Pt arrived to PACU. VSS.  Breathing easily.  1435 Pt waking up more. C/o sore throat.  Apple juice given.  1445 Pt states she has a headache from not eating.  Pt eating peanut butter crackers and water. Husband at bedside  1447  Bp low. Fluids opened.  1508 BP WNL.  Headache subsided.  Pt meets discharge criteria.

## 2018-03-05 NOTE — Interval H&P Note (Signed)
Permission received to review discharge instructions and discuss private health information with [facility]  Patient states that s/o  will be with them for at least 24 hours following today's procedure.   Bear Paw warmer attached to pt's gown; remote given to pt

## 2018-03-05 NOTE — Procedures (Signed)
Procedures  by Christin Bach, MD at 03/05/18 1431                Author: Christin Bach, MD  Service: Gastroenterology  Author Type: Physician       Filed: 03/05/18 1446  Date of Service: 03/05/18 1431  Status: Signed          Editor: Christin Bach, MD (Physician)            Pre-procedure Diagnoses        1. Other chest pain [R07.89]        2. Gastroesophageal reflux disease without esophagitis [K21.9]                           Post-procedure Diagnoses        1. Candida esophagitis ([place]) [B37.81]        2. Gastritis without bleeding, unspecified chronicity, unspecified gastritis type [K29.70]                           Procedures        1. UPPER GI ENDOSCOPY,BIOPSY [[id number]]                                        Esophagogastroduodenoscopy Procedure Note         Tifani Dack   1955-09-05   [id number]      Indication:  gerd, chest pain       Endoscopist: Christin Bach, MD      Referring Provider:  Danella Penton, MD      Sedation:  MAC anesthesia Propofol      Procedure Details:   After infomed consent was obtained for the procedure, with all risks and benefits of procedure explained the patient was taken to the endoscopy suite and placed in the left lateral decubitus position.  Following sequential administration of sedation as  per above, the endoscope was inserted into the mouth and advanced under direct vision to second portion of the duodenum.  A careful inspection was made as the gastroscope was withdrawn, including a retroflexed view of the proximal stomach; findings and  interventions are described below.        Findings:       Esophagus:    + There was yellowish exudate that was coating the entire esophagus c/w Candida Esophagitis s/p brushing.  Mucosa underlying the exudate appeared normal.      Stomach:    + Erythema in proximal stomach and distal stomach c/w gastritis, nonerosive s/p Bx.   + J shaped stomach      Duodenum:    -The bulb and post bulbar mucosa is normal in  appearance to the second portion. The duodenal folds appeared normal.  Cold forceps biopsies to r/o celiac.       Therapies: as above      Specimen: Specimens were collected as described and send to the laboratory.             Complications:   None were encountered during the procedure.      EBL: < 10 ml.            Recommendations:    -f/u path   -Fluconazole and acid suppression      Christin Bach, MD   03/05/2018  2:36 PM

## 2018-03-05 NOTE — Anesthesia Pre-Procedure Evaluation (Signed)
Anesthetic History          Comments: Has h/o feeling SOB post-op     Review of Systems / Medical History  Patient summary reviewed, nursing notes reviewed and pertinent labs reviewed    Pulmonary        Sleep apnea: No treatment  Smoker (1/2 ppd)         Neuro/Psych         Psychiatric history (depression)    Comments: ADHD  Tremors  peripheral neuropathy Cardiovascular  Within defined limits                Exercise tolerance: >4 METS     GI/Hepatic/Renal  Within defined limits              Endo/Other        Obesity and arthritis     Other Findings   Comments: Lupus, Menieres &, Fibromyalgia     post-laminectomy syndrome     Lumbar radiculopathy/ pinched nerve     sciatica    Other pain disorders related to psychological factors                   Physical Exam    Airway  Mallampati: II  TM Distance: 4 - 6 cm  Neck ROM: normal range of motion   Mouth opening: Normal     Cardiovascular    Rhythm: regular  Rate: normal         Dental  No notable dental hx       Pulmonary  Breath sounds clear to auscultation               Abdominal  GI exam deferred       Other Findings            Anesthetic Plan    ASA: 3  Anesthesia type: general and total IV anesthesia          Induction: Intravenous  Anesthetic plan and risks discussed with: Patient

## 2018-03-05 NOTE — Interval H&P Note (Signed)
Rapid H Pylori obtained 1422

## 2018-03-05 NOTE — Interval H&P Note (Signed)
Katherine [name]  12/13/1955  [id number]    Situation:  Verbal report given from: A. [place] CRNA  Procedure: Procedure(s):  ESOPHAGOGASTRODUODENOSCOPY (EGD)  ENDOSCOPIC BRUSHING  ESOPHAGOGASTRODUODENAL (EGD) BIOPSY    Background:    Preoperative diagnosis: EPIGASTRIC PAIN, REGURGITATION, ESOPHAGEAL DYSMOTILITY    Postoperative diagnosis: Candida Esophagitis, Gastritis    Operator:  Dr. Samantha Crimes    Assistant(s): Circ-1: Dameron, Rudi Heap, RN  Scrub Tech-1: Nicholes Stairs  Scrub RN-1: Lajuana Ripple, RN    Specimens:   ID Type Source Tests Collected by Time Destination   1 : Stomach Biopsy Preservative Stomach  Christin Bach, MD 03/05/2018 1424 Pathology   2 : Duodenum Biopsy Preservative Duodenum  Christin Bach, MD 03/05/2018 1425 Pathology   3 : [place] Biopsy Preservative [place]  Christin Bach, MD 03/05/2018 1425 Pathology   4 : Mid-Esophagus Biopsy Preservative Esophagus, Mid  Christin Bach, MD 03/05/2018 1426 Pathology   1 : Esophageal Brushings Fresh Esophageal Brushing  Christin Bach, MD 03/05/2018 1420 Cytology       Assessment:  Intra-procedure medications   Propofol 200 mg      Anesthesia gave intra-procedure sedation and medications, see anesthesia flow sheet     Intravenous fluids: LR@ KVO     Vital signs stable     Abdominal assessment: round and soft       Recommendation:    Permission to share finding with husband yes    All side rails up, bed in low position, wheels locked. Nurse at bedside.

## 2018-03-05 NOTE — Interval H&P Note (Signed)
Ambulatory to bathroom

## 2018-03-05 NOTE — Interval H&P Note (Signed)
Pt reports pain "is much better."

## 2018-03-05 NOTE — H&P (Signed)
Gastroenterology Outpatient History and Physical    Patient: Katherine [name]    Physician: Christin Bach, MD    Vital Signs: Blood pressure 123/82, pulse 78, temperature 99.1 ??F (37.3 ??C), resp. rate 11, height 5\' 6"  (1.676 m), weight 97.5 kg (215 lb), SpO2 97 %, not currently breastfeeding.    Allergies:   Allergies   Allergen Reactions   ??? Morphine Other (comments)     "it does not stop the pain"   ??? Pcn [Penicillins] Rash and Swelling       Chief Complaint: Chest pain, Dyspepsia    History of Present Illness: 62 yo WF with atypical chest pain and dyspepsia.  Has dysphagia for rice.      Justification for Procedure: above    History:  Past Medical History:   Diagnosis Date   ??? Adverse effect of anesthesia     hard time breathing after surgery, sister hard to wake up   ??? Arthritis    ??? Burning with urination    ??? Depression    ??? Diarrhea    ??? Diverticulosis    ??? Dizziness    ??? Easy bruising    ??? Fibromyalgia     chronic pain   ??? Headache(784.0)    ??? Hearing loss    ??? Ill-defined condition     past hx of shingles--eye/ear 13 years ago   ??? Lupus ([place])    ??? Meniere disease    ??? Night sweats    ??? Numbness     feet   ??? Sleep apnea     did not tolerate cpap   ??? Sweats, menopausal    ??? Visual loss       Past Surgical History:   Procedure Laterality Date   ??? HX APPENDECTOMY     ??? HX BREAST BIOPSY Left     benign core bx   ??? HX CARPAL TUNNEL RELEASE Right 01/2014    with trigger finger   ??? HX CHOLECYSTECTOMY     ??? HX COLECTOMY  1994    1 foot of colon removed   ??? HX COLONOSCOPY     ??? HX CORNEAL TRANSPLANT Right    ??? HX ENDOSCOPY      with dilatation   ??? HX GYN      hysterectomy   ??? HX HEENT      two ear surgeries to try to fix meniere's disease, second one they used gentimycin, ruined her balance always dizzy   ??? HX HERNIA REPAIR      left, incisional after first bladder repair   ??? HX KNEE ARTHROSCOPY Right    ??? HX KNEE REPLACEMENT Right    ??? HX ORTHOPAEDIC  1982, 1991    back x2, no hardware   ??? HX TONSILLECTOMY     ???  HX UROLOGICAL      bladder surgery x2      Social History     Socioeconomic History   ??? Marital status: WIDOWED     Spouse name: Not on file   ??? Number of children: Not on file   ??? Years of education: Not on file   ??? Highest education level: Not on file   Tobacco Use   ??? Smoking status: Current Every Day Smoker     Packs/day: 0.50     Years: 30.00     Pack years: 15.00     Last attempt to quit: 03/18/2017     Years since quitting: 0.9   ???  Smokeless tobacco: Never Used   ??? Tobacco comment: off and on   Substance and Sexual Activity   ??? Alcohol use: Yes     Comment: rarely   ??? Drug use: Never      Family History   Problem Relation Age of Onset   ??? Heart Disease Mother    ??? Cancer Mother         breast cancer   ??? Breast Cancer Mother 58   ??? Hypertension Father    ??? Diabetes Father    ??? Heart Disease Sister        Medications:   Prior to Admission medications    Medication Sig Start Date End Date Taking? Authorizing Provider   dexAMETHasone (DECADRON) 4 mg tablet Take 4 mg by mouth two (2) times daily (with meals).   Yes Provider, Historical   HYDROcodone-acetaminophen (NORCO) 10-325 mg tablet Take 1 Tab by mouth every eight (8) hours as needed for Pain.   Yes Provider, Historical   tiZANidine (ZANAFLEX) 4 mg capsule Take 4 mg by mouth three (3) times daily.   Yes Provider, Historical   topiramate (TOPAMAX) 200 mg tablet Take 200 mg by mouth two (2) times a day.   Yes Provider, Historical   flaxseed oil (OMEGA 3 PO) Take 1 Cap by mouth daily.   Yes Provider, Historical   cetirizine HCl (CETIRIZINE PO) Take 10 mg by mouth every morning.   Yes Provider, Historical   nortriptyline (PAMELOR) 50 mg capsule Take 50 mg by mouth nightly.   Yes Provider, Historical   prenatal vit 91/iron/folic/dha (PRENATAL + DHA PO) Take 1 Tab by mouth daily.   Yes Provider, Historical   POTASSIUM CHLORIDE PO Take 10 mEq by mouth two (2) times a day.   Yes Provider, Historical   raNITIdine (ZANTAC) 150 mg tablet TAKE 1 TABLET BY MOUTH TWICE A  DAY 02/14/17  Yes Provider, Historical   gabapentin (NEURONTIN) 300 mg capsule Take 2 Caps by mouth three (3) times daily.  Patient taking differently: Take 600 mg by mouth two (2) times a day. 03/04/17  Yes Shearon Stalls, NP   linaclotide Rolan Lipa) 145 mcg cap capsule Take 145 mcg by mouth Daily (before breakfast).   Yes Provider, Historical   primidone (MYSOLINE) 50 mg tablet Take 1 Tab by mouth three (3) times daily.  Patient taking differently: Take 50 mg by mouth two (2) times a day. 01/20/13  Yes Kalman Shan, Diane L, NP   hydroxychloroquine (PLAQUENIL) 200 mg tablet Take 200 mg by mouth two (2) times a day.   Yes Other, Phys, MD   valacyclovir (VALTREX) 1 g tablet Take 1,000 mg by mouth every morning. 08/28/09  Yes Other, Phys, MD   duloxetine (CYMBALTA) 60 mg capsule Take 60 mg by mouth two (2) times a day. 08/28/09  Yes Other, Phys, MD   triamterene-hydrochlorothiazide (DYAZIDE) 37.5-25 mg per capsule Take 1 Cap by mouth daily.   Yes Other, Phys, MD   ergocalciferol (VITAMIN D) 50,000 unit capsule Take 50,000 Units by mouth every seven (7) days. 08/28/09  Yes Other, Phys, MD   azelastine (ASTELIN) 137 mcg nasal spray 1 Spray by Nasal route two (2) times a day. Administer to right and left nostril.   Yes Other, Phys, MD   prednisoLONE acetate (PRED FORTE) 1 % ophthalmic suspension Administer 1 Drop to right eye every morning. 08/28/09  Yes Other, Phys, MD   naloxone (NARCAN) 4 mg/actuation nasal spray 1 Spray by IntraNASal route  as needed (respiratory depression). Give single spray into one nostril. Call 911. Give additional doses every 2 to 3 minutes alternating nostrils until assistance arrives using a new nasal spray with each dose, if patient does not respond or responds and then relapses. 05/05/17   Nance Pew, NP       Physical Exam:   General: alert, no distress   HEENT: Head: Normocephalic, no lesions, without obvious abnormality.   Heart: regular rate and rhythm, S1, S2 normal, no murmur, click, rub or  gallop   Lungs: chest clear, no wheezing, rales, normal symmetric air entry   Abdominal: soft, NT/ND + BS   Neurological: Grossly normal   Extremities: extremities normal, atraumatic, no cyanosis or edema     Findings/Diagnosis: Dyspepsia, intermittent chest pain and dysphagia    Plan of Care/Planned Procedure: EGD

## 2018-03-05 NOTE — Interval H&P Note (Signed)
R/O CANDIDA    SPECIMEN TAKEN AT 1419

## 2018-03-05 NOTE — Anesthesia Pre-Procedure Evaluation (Addendum)
Anesthetic History          Comments: Has h/o feeling SOB post-op     Review of Systems / Medical History  Patient summary reviewed, nursing notes reviewed and pertinent labs reviewed    Pulmonary        Sleep apnea: No treatment  Smoker (1/2 ppd)         Neuro/Psych         Psychiatric history (depression)    Comments: ADHD  Tremors  peripheral neuropathy Cardiovascular  Within defined limits                Exercise tolerance: >4 METS     GI/Hepatic/Renal  Within defined limits              Endo/Other        Obesity and arthritis     Other Findings   Comments: Lupus, Menieres &, Fibromyalgia     post-laminectomy syndrome     Lumbar radiculopathy/ pinched nerve     sciatica    Other pain disorders related to psychological factors                   Physical Exam    Airway  Mallampati: II  TM Distance: 4 - 6 cm  Neck ROM: normal range of motion   Mouth opening: Normal     Cardiovascular    Rhythm: regular  Rate: normal         Dental  No notable dental hx       Pulmonary  Breath sounds clear to auscultation               Abdominal  GI exam deferred       Other Findings            Anesthetic Plan    ASA: 3  Anesthesia type: general and total IV anesthesia          Induction: Intravenous  Anesthetic plan and risks discussed with: Patient

## 2018-03-05 NOTE — Other (Signed)
Ambulatory to bathroom

## 2018-03-05 NOTE — Other (Signed)
19 Pt arrived to PACU. VSS.  Breathing easily.  1435 Pt waking up more. C/o sore throat.  Apple juice given.  1445 Pt states she has a headache from not eating.  Pt eating peanut butter crackers and water. Husband at bedside  1447  Bp low. Fluids opened.  1508 BP WNL.  Headache subsided.  Pt meets discharge criteria.

## 2018-03-05 NOTE — Other (Signed)
Areona Homer  [birth year]  [id number]    Situation:  Verbal report given from: A. [place] CRNA  Procedure: Procedure(s):  ESOPHAGOGASTRODUODENOSCOPY (EGD)  ENDOSCOPIC BRUSHING  ESOPHAGOGASTRODUODENAL (EGD) BIOPSY    Background:    Preoperative diagnosis: EPIGASTRIC PAIN, REGURGITATION, ESOPHAGEAL DYSMOTILITY    Postoperative diagnosis: Candida Esophagitis, Gastritis    Operator:  Dr. Samantha Crimes    Assistant(s): Circ-1: Dameron, Rudi Heap, RN  Scrub Tech-1: Nicholes Stairs  Scrub RN-1: Lajuana Ripple, RN    Specimens:   ID Type Source Tests Collected by Time Destination   1 : Stomach Biopsy Preservative Stomach  Christin Bach, MD 03/05/2018 1424 Pathology   2 : Duodenum Biopsy Preservative Duodenum  Christin Bach, MD 03/05/2018 1425 Pathology   3 : [place] Biopsy Preservative GE Junction  Christin Bach, MD 03/05/2018 1425 Pathology   4 : Mid-Esophagus Biopsy Preservative Esophagus, Mid  Christin Bach, MD 03/05/2018 1426 Pathology   1 : Esophageal Brushings Fresh Esophageal Brushing  Christin Bach, MD 03/05/2018 1420 Cytology       Assessment:  Intra-procedure medications   Propofol 200 mg      Anesthesia gave intra-procedure sedation and medications, see anesthesia flow sheet     Intravenous fluids: LR@ KVO     Vital signs stable     Abdominal assessment: round and soft       Recommendation:    Permission to share finding with husband yes    All side rails up, bed in low position, wheels locked. Nurse at bedside.

## 2018-03-05 NOTE — Other (Signed)
Permission received to review discharge instructions and discuss private health information with [facility]  Patient states that s/o  will be with them for at least 24 hours following today's procedure.   Bear Paw warmer attached to pt's gown; remote given to pt

## 2018-03-05 NOTE — Other (Signed)
R/O CANDIDA    SPECIMEN TAKEN AT 1419

## 2018-03-05 NOTE — Other (Signed)
Pt reports pain "is much better"

## 2018-03-05 NOTE — Procedures (Signed)
Esophagogastroduodenoscopy Procedure Note      Katherine [name]  August 16, 1956  [id number]    Indication:  gerd, chest pain     Endoscopist: Christin Bach, MD    Referring Provider:  Danella Penton, MD    Sedation:  MAC anesthesia Propofol    Procedure Details:  After infomed consent was obtained for the procedure, with all risks and benefits of procedure explained the patient was taken to the endoscopy suite and placed in the left lateral decubitus position.  Following sequential administration of sedation as per above, the endoscope was inserted into the mouth and advanced under direct vision to second portion of the duodenum.  A careful inspection was made as the gastroscope was withdrawn, including a retroflexed view of the proximal stomach; findings and interventions are described below.      Findings:     Esophagus:   + There was yellowish exudate that was coating the entire esophagus c/w Candida Esophagitis s/p brushing.  Mucosa underlying the exudate appeared normal.    Stomach:   + Erythema in proximal stomach and distal stomach c/w gastritis, nonerosive s/p Bx.  + J shaped stomach    Duodenum:   -The bulb and post bulbar mucosa is normal in appearance to the second portion. The duodenal folds appeared normal.  Cold forceps biopsies to r/o celiac.     Therapies: as above    Specimen: Specimens were collected as described and send to the laboratory.           Complications:   None were encountered during the procedure.    EBL: < 10 ml.          Recommendations:   -f/u path  -Fluconazole and acid suppression    Christin Bach, MD  03/05/2018  2:36 PM

## 2018-03-05 NOTE — Other (Signed)
Medicated per Dr. Ruthann Cancer. CMx2

## 2018-03-05 NOTE — H&P (Signed)
Gastroenterology Outpatient History and Physical    Patient: Katherine [name]    Physician: Christin Bach, MD    Vital Signs: Blood pressure 123/82, pulse 78, temperature 99.1 ??F (37.3 ??C), resp. rate 11, height 5\' 6"  (1.676 m), weight 97.5 kg (215 lb), SpO2 97 %, not currently breastfeeding.    Allergies:   Allergies   Allergen Reactions   ??? Morphine Other (comments)     "it does not stop the pain"   ??? Pcn [Penicillins] Rash and Swelling       Chief Complaint: Chest pain, Dyspepsia    History of Present Illness: 62 yo WF with atypical chest pain and dyspepsia.  Has dysphagia for rice.      Justification for Procedure: above    History:  Past Medical History:   Diagnosis Date   ??? Adverse effect of anesthesia     hard time breathing after surgery, sister hard to wake up   ??? Arthritis    ??? Burning with urination    ??? Depression    ??? Diarrhea    ??? Diverticulosis    ??? Dizziness    ??? Easy bruising    ??? Fibromyalgia     chronic pain   ??? Headache(784.0)    ??? Hearing loss    ??? Ill-defined condition     past hx of shingles--eye/ear 13 years ago   ??? Lupus ([place])    ??? Meniere disease    ??? Night sweats    ??? Numbness     feet   ??? Sleep apnea     did not tolerate cpap   ??? Sweats, menopausal    ??? Visual loss       Past Surgical History:   Procedure Laterality Date   ??? HX APPENDECTOMY     ??? HX BREAST BIOPSY Left     benign core bx   ??? HX CARPAL TUNNEL RELEASE Right 01/2014    with trigger finger   ??? HX CHOLECYSTECTOMY     ??? HX COLECTOMY  1994    1 foot of colon removed   ??? HX COLONOSCOPY     ??? HX CORNEAL TRANSPLANT Right    ??? HX ENDOSCOPY      with dilatation   ??? HX GYN      hysterectomy   ??? HX HEENT      two ear surgeries to try to fix meniere's disease, second one they used gentimycin, ruined her balance always dizzy   ??? HX HERNIA REPAIR      left, incisional after first bladder repair   ??? HX KNEE ARTHROSCOPY Right    ??? HX KNEE REPLACEMENT Right    ??? HX ORTHOPAEDIC  1982, 1991    back x2, no hardware   ??? HX TONSILLECTOMY      ??? HX UROLOGICAL      bladder surgery x2      Social History     Socioeconomic History   ??? Marital status: WIDOWED     Spouse name: Not on file   ??? Number of children: Not on file   ??? Years of education: Not on file   ??? Highest education level: Not on file   Tobacco Use   ??? Smoking status: Current Every Day Smoker     Packs/day: 0.50     Years: 30.00     Pack years: 15.00     Last attempt to quit: 03/18/2017     Years since quitting: 0.9   ???  Smokeless tobacco: Never Used   ??? Tobacco comment: off and on   Substance and Sexual Activity   ??? Alcohol use: Yes     Comment: rarely   ??? Drug use: Never      Family History   Problem Relation Age of Onset   ??? Heart Disease Mother    ??? Cancer Mother         breast cancer   ??? Breast Cancer Mother 61   ??? Hypertension Father    ??? Diabetes Father    ??? Heart Disease Sister        Medications:   Prior to Admission medications    Medication Sig Start Date End Date Taking? Authorizing Provider   dexAMETHasone (DECADRON) 4 mg tablet Take 4 mg by mouth two (2) times daily (with meals).   Yes Provider, Historical   HYDROcodone-acetaminophen (NORCO) 10-325 mg tablet Take 1 Tab by mouth every eight (8) hours as needed for Pain.   Yes Provider, Historical   tiZANidine (ZANAFLEX) 4 mg capsule Take 4 mg by mouth three (3) times daily.   Yes Provider, Historical   topiramate (TOPAMAX) 200 mg tablet Take 200 mg by mouth two (2) times a day.   Yes Provider, Historical   flaxseed oil (OMEGA 3 PO) Take 1 Cap by mouth daily.   Yes Provider, Historical   cetirizine HCl (CETIRIZINE PO) Take 10 mg by mouth every morning.   Yes Provider, Historical   nortriptyline (PAMELOR) 50 mg capsule Take 50 mg by mouth nightly.   Yes Provider, Historical   prenatal vit 91/iron/folic/dha (PRENATAL + DHA PO) Take 1 Tab by mouth daily.   Yes Provider, Historical   POTASSIUM CHLORIDE PO Take 10 mEq by mouth two (2) times a day.   Yes Provider, Historical    raNITIdine (ZANTAC) 150 mg tablet TAKE 1 TABLET BY MOUTH TWICE A DAY 02/14/17  Yes Provider, Historical   gabapentin (NEURONTIN) 300 mg capsule Take 2 Caps by mouth three (3) times daily.  Patient taking differently: Take 600 mg by mouth two (2) times a day. 03/04/17  Yes Shearon Stalls, NP   linaclotide Rolan Lipa) 145 mcg cap capsule Take 145 mcg by mouth Daily (before breakfast).   Yes Provider, Historical   primidone (MYSOLINE) 50 mg tablet Take 1 Tab by mouth three (3) times daily.  Patient taking differently: Take 50 mg by mouth two (2) times a day. 01/20/13  Yes Kalman Shan, Diane L, NP   hydroxychloroquine (PLAQUENIL) 200 mg tablet Take 200 mg by mouth two (2) times a day.   Yes Other, Phys, MD   valacyclovir (VALTREX) 1 g tablet Take 1,000 mg by mouth every morning. 08/28/09  Yes Other, Phys, MD   duloxetine (CYMBALTA) 60 mg capsule Take 60 mg by mouth two (2) times a day. 08/28/09  Yes Other, Phys, MD   triamterene-hydrochlorothiazide (DYAZIDE) 37.5-25 mg per capsule Take 1 Cap by mouth daily.   Yes Other, Phys, MD   ergocalciferol (VITAMIN D) 50,000 unit capsule Take 50,000 Units by mouth every seven (7) days. 08/28/09  Yes Other, Phys, MD   azelastine (ASTELIN) 137 mcg nasal spray 1 Spray by Nasal route two (2) times a day. Administer to right and left nostril.   Yes Other, Phys, MD   prednisoLONE acetate (PRED FORTE) 1 % ophthalmic suspension Administer 1 Drop to right eye every morning. 08/28/09  Yes Other, Phys, MD   naloxone (NARCAN) 4 mg/actuation nasal spray 1 Spray by IntraNASal route  as needed (respiratory depression). Give single spray into one nostril. Call 911. Give additional doses every 2 to 3 minutes alternating nostrils until assistance arrives using a new nasal spray with each dose, if patient does not respond or responds and then relapses. 05/05/17   Nance Pew, NP       Physical Exam:   General: alert, no distress   HEENT: Head: Normocephalic, no lesions, without obvious abnormality.    Heart: regular rate and rhythm, S1, S2 normal, no murmur, click, rub or gallop   Lungs: chest clear, no wheezing, rales, normal symmetric air entry   Abdominal: soft, NT/ND + BS   Neurological: Grossly normal   Extremities: extremities normal, atraumatic, no cyanosis or edema     Findings/Diagnosis: Dyspepsia, intermittent chest pain and dysphagia    Plan of Care/Planned Procedure: EGD

## 2018-03-05 NOTE — Other (Signed)
Rapid H Pylori obtained 1422

## 2018-03-08 MED FILL — DIPRIVAN 10 MG/ML INTRAVENOUS EMULSION: 10 mg/mL | INTRAVENOUS | Qty: 200

## 2018-03-08 MED FILL — LIDOCAINE (PF) 20 MG/ML (2 %) IJ SOLN: 20 mg/mL (2 %) | INTRAMUSCULAR | Qty: 40

## 2018-04-27 ENCOUNTER — Encounter

## 2018-05-06 ENCOUNTER — Encounter

## 2018-05-11 ENCOUNTER — Inpatient Hospital Stay: Payer: MEDICARE | Attending: Orthopaedic Surgery | Primary: Family Medicine

## 2018-05-17 ENCOUNTER — Inpatient Hospital Stay: Admit: 2018-05-17 | Payer: MEDICARE | Attending: Diagnostic Radiology | Primary: Family Medicine

## 2018-05-17 DIAGNOSIS — K224 Dyskinesia of esophagus: Secondary | ICD-10-CM

## 2018-05-25 ENCOUNTER — Inpatient Hospital Stay: Payer: MEDICARE | Attending: Orthopaedic Surgery | Primary: Family Medicine

## 2018-05-25 ENCOUNTER — Inpatient Hospital Stay: Primary: Family Medicine

## 2018-07-21 ENCOUNTER — Encounter

## 2018-07-21 ENCOUNTER — Inpatient Hospital Stay: Admit: 2018-07-21 | Payer: MEDICARE | Attending: Medical | Primary: Family Medicine

## 2018-07-21 DIAGNOSIS — R1030 Lower abdominal pain, unspecified: Secondary | ICD-10-CM

## 2018-07-21 MED ORDER — IOHEXOL 240 MG/ML IV SOLN
240 mg iodine/mL | Freq: Once | INTRAVENOUS | Status: AC
Start: 2018-07-21 — End: 2018-07-21
  Administered 2018-07-21: 21:00:00 via ORAL

## 2018-07-21 MED ORDER — SODIUM CHLORIDE 0.9 % IJ SYRG
Freq: Once | INTRAMUSCULAR | Status: AC
Start: 2018-07-21 — End: 2018-07-21
  Administered 2018-07-21: 21:00:00 via INTRAVENOUS

## 2018-07-21 MED ORDER — IOPAMIDOL 76 % IV SOLN
370 mg iodine /mL (76 %) | Freq: Once | INTRAVENOUS | Status: AC
Start: 2018-07-21 — End: 2018-07-21
  Administered 2018-07-21: 21:00:00 via INTRAVENOUS

## 2018-09-15 ENCOUNTER — Inpatient Hospital Stay: Payer: MEDICARE

## 2018-09-15 MED ORDER — PROPOFOL 10 MG/ML IV EMUL
10 mg/mL | INTRAVENOUS | Status: DC | PRN
Start: 2018-09-15 — End: 2018-09-15
  Administered 2018-09-15 (×10): via INTRAVENOUS

## 2018-09-15 MED ORDER — SIMETHICONE 40 MG/0.6 ML ORAL DROPS, SUSP
40 mg/0.6 mL | ORAL | Status: DC | PRN
Start: 2018-09-15 — End: 2018-09-15

## 2018-09-15 MED ORDER — SODIUM CHLORIDE 0.9 % IJ SYRG
Freq: Three times a day (TID) | INTRAMUSCULAR | Status: DC
Start: 2018-09-15 — End: 2018-09-15

## 2018-09-15 MED ORDER — SODIUM CHLORIDE 0.9 % IJ SYRG
INTRAMUSCULAR | Status: DC | PRN
Start: 2018-09-15 — End: 2018-09-15

## 2018-09-15 MED ORDER — SODIUM CHLORIDE 0.9 % IV
INTRAVENOUS | Status: DC
Start: 2018-09-15 — End: 2018-09-15
  Administered 2018-09-15 (×2): via INTRAVENOUS

## 2018-09-15 MED FILL — SODIUM CHLORIDE 0.9 % IV: INTRAVENOUS | Qty: 1000

## 2018-09-15 NOTE — Progress Notes (Signed)
Endoscope was pre-cleaned at bedside immediately following procedure by Horris Latino Radden ET    Anesthesia reports 280mg  Propofol,  and 391mL NS given during procedure.  Received report from anesthesia staff on vital signs and status of patient.      Sigmoid colon polyp not retrieved. Dr. Samantha Crimes notified

## 2018-09-15 NOTE — Anesthesia Pre-Procedure Evaluation (Signed)
Anesthetic History          Comments: Has h/o feeling SOB post-op     Review of Systems / Medical History  Patient summary reviewed, nursing notes reviewed and pertinent labs reviewed    Pulmonary        Sleep apnea: No treatment  Smoker (1/2 ppd)         Neuro/Psych         Headaches and psychiatric history (depression)    Comments: ADHD  Tremors  peripheral neuropathy  Hx sciatica Cardiovascular  Within defined limits                Exercise tolerance: >4 METS     GI/Hepatic/Renal               Comments: Diverticulosis  Endo/Other        Obesity and arthritis     Other Findings   Comments: Lupus, Menieres &, Fibromyalgia   post-laminectomy syndrome  Visual loss    Hearing loss     Other pain disorders related to psychological factors                     Physical Exam    Airway  Mallampati: II  TM Distance: 4 - 6 cm  Neck ROM: normal range of motion   Mouth opening: Normal     Cardiovascular    Rhythm: regular  Rate: normal         Dental  No notable dental hx       Pulmonary  Breath sounds clear to auscultation               Abdominal  GI exam deferred       Other Findings            Anesthetic Plan    ASA: 3  Anesthesia type: total IV anesthesia          Induction: Intravenous  Anesthetic plan and risks discussed with: Patient

## 2018-09-15 NOTE — Procedures (Signed)
Procedures  by Christin Bach, MD at 09/15/18 [phone]                Author: Christin Bach, MD  Service: Gastroenterology  Author Type: Physician       Filed: 09/15/18 0812  Date of Service: 09/15/18 0809  Status: Signed          Editor: Christin Bach, MD (Physician)            Pre-procedure Diagnoses        1. LLQ pain [R10.32]        2. Diarrhea, unspecified type [R19.7]                           Post-procedure Diagnoses        1. Diverticulosis of colon without hemorrhage [K57.30]        2. Polyp of sigmoid colon, unspecified type [D12.5]        3. Internal hemorrhoids [K64.8]                           Procedures        1. COLONOSCOPY,REMV LESN,SNARE [[id number]]        2. COLONOSCOPY,DIAGNOSTIC [[id number]]                                         Colonoscopy Procedure Note      Indications: LLQ pain, change in bowel habits      Referring Physician: Danella Penton, MD   Anesthesia/Sedation: MAC anesthesia Propofol   Endoscopist:  Dr. Zane Herald      Procedure in Detail:   Informed consent was obtained for the procedure, including sedation.  Risks of perforation, hemorrhage, adverse drug reaction, and aspiration were discussed. The patient was placed in the left lateral decubitus position.  Based on the pre-procedure assessment,  including review of the patient's medical history, medications, allergies, and review of systems, she had been deemed to be an appropriate candidate  for moderate sedation; she was therefore sedated with the medications listed above.   The patient was monitored continuously with ECG tracing,  pulse oximetry, blood pressure monitoring, and direct observations.        A rectal examination was performed. The CFQ180AL was inserted into the rectum and advanced under direct vision to the terminal ileum.  The quality of the colonic preparation was adequate.  A careful inspection was made as the colonoscope was withdrawn,  including a retroflexed view of the rectum;  findings and interventions are described below.  Appropriate photodocumentation was obtained.      Findings:       1.  Scope advanced to the cecum and terminal ileum.   2.  There was a normal distal anastomosis.   3.  Several diverticuli in the area of sigmoid.   4.  Tiny 3 mm polyp sessile s/p cold snare removal (not retrieved)   5.  Normal mucosa throughout s/p R and L sided bxs.      Therapies:   See above      Specimen: Specimens were collected as described above and sent to pathology.       Complications: None were encountered during the procedure .       EBL: < 10 ml.      Recommendations:    -  f/u path   -repeat Colonoscopy in 5 years   Signed By: Christin Bach, MD                         September 15, 2018

## 2018-09-15 NOTE — H&P (Signed)
Gastroenterology Outpatient History and Physical    Patient: Katherine [name]    Physician: Christin Bach, MD    Vital Signs: Blood pressure 118/73, pulse 84, temperature 98.3 ??F (36.8 ??C), resp. rate 17, height 5\' 6"  (1.676 m), weight 99.1 kg (218 lb 8 oz), SpO2 96 %, not currently breastfeeding.    Allergies:   Allergies   Allergen Reactions   ??? Morphine Other (comments)     "it does not stop the pain"   ??? Pcn [Penicillins] Rash and Swelling       Chief Complaint: LLQ pain    History of Present Illness: 63 yo WF for colonoscopy for LLQ pain and h/o prior diverticulitis.    Justification for Procedure: above    History:  Past Medical History:   Diagnosis Date   ??? Adverse effect of anesthesia     hard time breathing after surgery, sister hard to wake up   ??? Arthritis    ??? Burning with urination    ??? Depression    ??? Diarrhea    ??? Diverticulosis    ??? Dizziness    ??? Easy bruising    ??? Fibromyalgia     chronic pain   ??? Headache(784.0)    ??? Hearing loss    ??? Ill-defined condition     past hx of shingles--eye/ear 13 years ago   ??? Lupus ([place])    ??? Meniere disease    ??? Night sweats    ??? Numbness     feet   ??? Sleep apnea     did not tolerate cpap   ??? Sweats, menopausal    ??? Visual loss       Past Surgical History:   Procedure Laterality Date   ??? HX APPENDECTOMY     ??? HX BREAST BIOPSY Left     benign core bx   ??? HX CARPAL TUNNEL RELEASE Right 01/2014    with trigger finger   ??? HX CHOLECYSTECTOMY     ??? HX COLECTOMY  1994    1 foot of colon removed   ??? HX COLONOSCOPY     ??? HX CORNEAL TRANSPLANT Right    ??? HX ENDOSCOPY      with dilatation   ??? HX GYN      hysterectomy   ??? HX HEENT      two ear surgeries to try to fix meniere's disease, second one they used gentimycin, ruined her balance always dizzy   ??? HX HERNIA REPAIR      left, incisional after first bladder repair   ??? HX KNEE ARTHROSCOPY Right    ??? HX KNEE REPLACEMENT Right    ??? HX ORTHOPAEDIC  1982, 1991    back x2, no hardware   ??? HX TONSILLECTOMY     ??? HX UROLOGICAL       bladder surgery x2      Social History     Socioeconomic History   ??? Marital status: WIDOWED     Spouse name: Not on file   ??? Number of children: Not on file   ??? Years of education: Not on file   ??? Highest education level: Not on file   Tobacco Use   ??? Smoking status: Current Every Day Smoker     Packs/day: 0.50     Years: 30.00     Pack years: 15.00   ??? Smokeless tobacco: Never Used   Substance and Sexual Activity   ??? Alcohol use: Yes  Comment: rarely   ??? Drug use: Never      Family History   Problem Relation Age of Onset   ??? Heart Disease Mother    ??? Cancer Mother         breast cancer   ??? Breast Cancer Mother 59   ??? Hypertension Father    ??? Diabetes Father    ??? Heart Disease Sister        Medications:   Prior to Admission medications    Medication Sig Start Date End Date Taking? Authorizing Provider   dexAMETHasone (DECADRON) 4 mg tablet Take 4 mg by mouth two (2) times daily (with meals).   Yes Provider, Historical   HYDROcodone-acetaminophen (NORCO) 10-325 mg tablet Take 1 Tab by mouth every eight (8) hours as needed for Pain.   Yes Provider, Historical   tiZANidine (ZANAFLEX) 4 mg capsule Take 4 mg by mouth three (3) times daily.   Yes Provider, Historical   flaxseed oil (OMEGA 3 PO) Take 1 Cap by mouth daily.   Yes Provider, Historical   nortriptyline (PAMELOR) 50 mg capsule Take 50 mg by mouth nightly.   Yes Provider, Historical   prenatal vit 91/iron/folic/dha (PRENATAL + DHA PO) Take 1 Tab by mouth daily.   Yes Provider, Historical   POTASSIUM CHLORIDE PO Take 10 mEq by mouth two (2) times a day.   Yes Provider, Historical   gabapentin (NEURONTIN) 300 mg capsule Take 2 Caps by mouth three (3) times daily.  Patient taking differently: Take 600 mg by mouth two (2) times a day. 03/04/17  Yes Shearon Stalls, NP   linaclotide Rolan Lipa) 145 mcg cap capsule Take 145 mcg by mouth Daily (before breakfast).   Yes Provider, Historical   hydroxychloroquine (PLAQUENIL) 200 mg tablet Take 200 mg by mouth two (2)  times a day.   Yes Other, Phys, MD   valacyclovir (VALTREX) 1 g tablet Take 1,000 mg by mouth every morning. 08/28/09  Yes Other, Phys, MD   duloxetine (CYMBALTA) 60 mg capsule Take 60 mg by mouth two (2) times a day. 08/28/09  Yes Other, Phys, MD   triamterene-hydrochlorothiazide (DYAZIDE) 37.5-25 mg per capsule Take 1 Cap by mouth daily.   Yes Other, Phys, MD   ergocalciferol (VITAMIN D) 50,000 unit capsule Take 50,000 Units by mouth every seven (7) days. 08/28/09  Yes Other, Phys, MD   azelastine (ASTELIN) 137 mcg nasal spray 1 Spray by Nasal route two (2) times a day. Administer to right and left nostril.   Yes Other, Phys, MD   prednisoLONE acetate (PRED FORTE) 1 % ophthalmic suspension Administer 1 Drop to right eye every morning. 08/28/09  Yes Other, Phys, MD   topiramate (TOPAMAX) 200 mg tablet Take 200 mg by mouth two (2) times a day.    Provider, Historical   cetirizine HCl (CETIRIZINE PO) Take 10 mg by mouth every morning.    Provider, Historical       Physical Exam:   General: alert, no distress   HEENT: Head: Normocephalic, no lesions, without obvious abnormality.   Heart: regular rate and rhythm, S1, S2 normal, no murmur, click, rub or gallop   Lungs: chest clear, no wheezing, rales, normal symmetric air entry   Abdominal: soft NT/ND + BS   Neurological: Grossly normal   Extremities: extremities normal, atraumatic, no cyanosis or edema     Findings/Diagnosis: LLQ pain    Plan of Care/Planned Procedure: Colonoscopy

## 2018-09-15 NOTE — Anesthesia Post-Procedure Evaluation (Signed)
Procedure(s):  COLONOSCOPY  COLON BIOPSY  ENDOSCOPIC POLYPECTOMY.    total IV anesthesia    Anesthesia Post Evaluation        Patient location during evaluation: PACU  Note status: Adequate.  Level of consciousness: responsive to verbal stimuli and sleepy but conscious  Pain management: satisfactory to patient  Airway patency: patent  Anesthetic complications: no  Cardiovascular status: acceptable  Respiratory status: acceptable  Hydration status: acceptable  Comments: +Post-Anesthesia Evaluation and Assessment    Patient: Katherine [name] MRN: [id number]  SSN: [id number]   Date of Birth: 1956-05-05  Age: 63 y.o.  Sex: female      Cardiovascular Function/Vital Signs    BP 124/75    Pulse 78    Temp 36.4 ??C (97.5 ??F)    Resp 12    Ht 5\' 6"  (1.676 m)    Wt 99.1 kg (218 lb 8 oz)    SpO2 99%    Breastfeeding No    BMI 35.27 kg/m??     Patient is status post Procedure(s):  COLONOSCOPY  COLON BIOPSY  ENDOSCOPIC POLYPECTOMY.    Nausea/Vomiting: Controlled.    Postoperative hydration reviewed and adequate.    Pain:  Pain Scale 1: Numeric (0 - 10) (09/15/18 0830)  Pain Intensity 1: 8 (09/15/18 0830)   Managed.    Neurological Status:       At baseline.    Mental Status and Level of Consciousness: Arousable.    Pulmonary Status:   O2 Device: Room air (09/15/18 0836)   Adequate oxygenation and airway patent.    Complications related to anesthesia: None    Post-anesthesia assessment completed. No concerns.    Signed By: Charlyn Minerva, MD    09/15/2018  Post anesthesia nausea and vomiting:  controlled      Vitals Value Taken Time   BP 124/75 09/15/2018  8:36 AM   Temp 36.4 ??C (97.5 ??F) 09/15/2018  8:26 AM   Pulse 78 09/15/2018  8:36 AM   Resp 12 09/15/2018  8:36 AM   SpO2 99 % 09/15/2018  8:36 AM

## 2018-09-15 NOTE — Anesthesia Post-Procedure Evaluation (Signed)
Procedure(s):  COLONOSCOPY  COLON BIOPSY  ENDOSCOPIC POLYPECTOMY.    total IV anesthesia    Anesthesia Post Evaluation        Patient location during evaluation: PACU  Note status: Adequate.  Level of consciousness: responsive to verbal stimuli and sleepy but conscious  Pain management: satisfactory to patient  Airway patency: patent  Anesthetic complications: no  Cardiovascular status: acceptable  Respiratory status: acceptable  Hydration status: acceptable  Comments: +Post-Anesthesia Evaluation and Assessment    Patient: Katherine [name] MRN: [id number]  SSN: [id number]   Date of Birth: Jun 11, 1956  Age: 63 y.o.  Sex: female      Cardiovascular Function/Vital Signs    BP 124/75    Pulse 78    Temp 36.4 ??C (97.5 ??F)    Resp 12    Ht 5\' 6"  (1.676 m)    Wt 99.1 kg (218 lb 8 oz)    SpO2 99%    Breastfeeding No    BMI 35.27 kg/m??     Patient is status post Procedure(s):  COLONOSCOPY  COLON BIOPSY  ENDOSCOPIC POLYPECTOMY.    Nausea/Vomiting: Controlled.    Postoperative hydration reviewed and adequate.    Pain:  Pain Scale 1: Numeric (0 - 10) (09/15/18 0830)  Pain Intensity 1: 8 (09/15/18 0830)   Managed.    Neurological Status:       At baseline.    Mental Status and Level of Consciousness: Arousable.    Pulmonary Status:   O2 Device: Room air (09/15/18 0836)   Adequate oxygenation and airway patent.    Complications related to anesthesia: None    Post-anesthesia assessment completed. No concerns.    Signed By: Charlyn Minerva, MD    09/15/2018  Post anesthesia nausea and vomiting:  controlled      Vitals Value Taken Time   BP 124/75 09/15/2018  8:36 AM   Temp 36.4 ??C (97.5 ??F) 09/15/2018  8:26 AM   Pulse 78 09/15/2018  8:36 AM   Resp 12 09/15/2018  8:36 AM   SpO2 99 % 09/15/2018  8:36 AM

## 2018-09-15 NOTE — Procedures (Signed)
Colonoscopy Procedure Note    Indications: LLQ pain, change in bowel habits    Referring Physician: Danella Penton, MD  Anesthesia/Sedation: MAC anesthesia Propofol  Endoscopist:  Dr. Zane Herald    Procedure in Detail:  Informed consent was obtained for the procedure, including sedation.  Risks of perforation, hemorrhage, adverse drug reaction, and aspiration were discussed. The patient was placed in the left lateral decubitus position.  Based on the pre-procedure assessment, including review of the patient's medical history, medications, allergies, and review of systems, she had been deemed to be an appropriate candidate for moderate sedation; she was therefore sedated with the medications listed above.   The patient was monitored continuously with ECG tracing, pulse oximetry, blood pressure monitoring, and direct observations.      A rectal examination was performed. The CFQ180AL was inserted into the rectum and advanced under direct vision to the terminal ileum.  The quality of the colonic preparation was adequate.  A careful inspection was made as the colonoscope was withdrawn, including a retroflexed view of the rectum; findings and interventions are described below.  Appropriate photodocumentation was obtained.    Findings:     1.  Scope advanced to the cecum and terminal ileum.  2.  There was a normal distal anastomosis.  3.  Several diverticuli in the area of sigmoid.  4.  Tiny 3 mm polyp sessile s/p cold snare removal (not retrieved)  5.  Normal mucosa throughout s/p R and L sided bxs.    Therapies:  See above    Specimen: Specimens were collected as described above and sent to pathology.     Complications: None were encountered during the procedure.     EBL: < 10 ml.    Recommendations:   -f/u path  -repeat Colonoscopy in 5 years  Signed By: Christin Bach, MD                        September 15, 2018

## 2018-09-15 NOTE — H&P (Signed)
Gastroenterology Outpatient History and Physical    Patient: Katherine [name]    Physician: Christin Bach, MD    Vital Signs: Blood pressure 118/73, pulse 84, temperature 98.3 ??F (36.8 ??C), resp. rate 17, height 5\' 6"  (1.676 m), weight 99.1 kg (218 lb 8 oz), SpO2 96 %, not currently breastfeeding.    Allergies:   Allergies   Allergen Reactions   ??? Morphine Other (comments)     "it does not stop the pain"   ??? Pcn [Penicillins] Rash and Swelling       Chief Complaint: LLQ pain    History of Present Illness: 63 yo WF for colonoscopy for LLQ pain and h/o prior diverticulitis.    Justification for Procedure: above    History:  Past Medical History:   Diagnosis Date   ??? Adverse effect of anesthesia     hard time breathing after surgery, sister hard to wake up   ??? Arthritis    ??? Burning with urination    ??? Depression    ??? Diarrhea    ??? Diverticulosis    ??? Dizziness    ??? Easy bruising    ??? Fibromyalgia     chronic pain   ??? Headache(784.0)    ??? Hearing loss    ??? Ill-defined condition     past hx of shingles--eye/ear 13 years ago   ??? Lupus ([place])    ??? Meniere disease    ??? Night sweats    ??? Numbness     feet   ??? Sleep apnea     did not tolerate cpap   ??? Sweats, menopausal    ??? Visual loss       Past Surgical History:   Procedure Laterality Date   ??? HX APPENDECTOMY     ??? HX BREAST BIOPSY Left     benign core bx   ??? HX CARPAL TUNNEL RELEASE Right 01/2014    with trigger finger   ??? HX CHOLECYSTECTOMY     ??? HX COLECTOMY  1994    1 foot of colon removed   ??? HX COLONOSCOPY     ??? HX CORNEAL TRANSPLANT Right    ??? HX ENDOSCOPY      with dilatation   ??? HX GYN      hysterectomy   ??? HX HEENT      two ear surgeries to try to fix meniere's disease, second one they used gentimycin, ruined her balance always dizzy   ??? HX HERNIA REPAIR      left, incisional after first bladder repair   ??? HX KNEE ARTHROSCOPY Right    ??? HX KNEE REPLACEMENT Right    ??? HX ORTHOPAEDIC  1982, 1991    back x2, no hardware   ??? HX TONSILLECTOMY      ??? HX UROLOGICAL      bladder surgery x2      Social History     Socioeconomic History   ??? Marital status: WIDOWED     Spouse name: Not on file   ??? Number of children: Not on file   ??? Years of education: Not on file   ??? Highest education level: Not on file   Tobacco Use   ??? Smoking status: Current Every Day Smoker     Packs/day: 0.50     Years: 30.00     Pack years: 15.00   ??? Smokeless tobacco: Never Used   Substance and Sexual Activity   ??? Alcohol use: Yes  Comment: rarely   ??? Drug use: Never      Family History   Problem Relation Age of Onset   ??? Heart Disease Mother    ??? Cancer Mother         breast cancer   ??? Breast Cancer Mother 86   ??? Hypertension Father    ??? Diabetes Father    ??? Heart Disease Sister        Medications:   Prior to Admission medications    Medication Sig Start Date End Date Taking? Authorizing Provider   dexAMETHasone (DECADRON) 4 mg tablet Take 4 mg by mouth two (2) times daily (with meals).   Yes Provider, Historical   HYDROcodone-acetaminophen (NORCO) 10-325 mg tablet Take 1 Tab by mouth every eight (8) hours as needed for Pain.   Yes Provider, Historical   tiZANidine (ZANAFLEX) 4 mg capsule Take 4 mg by mouth three (3) times daily.   Yes Provider, Historical   flaxseed oil (OMEGA 3 PO) Take 1 Cap by mouth daily.   Yes Provider, Historical   nortriptyline (PAMELOR) 50 mg capsule Take 50 mg by mouth nightly.   Yes Provider, Historical   prenatal vit 91/iron/folic/dha (PRENATAL + DHA PO) Take 1 Tab by mouth daily.   Yes Provider, Historical   POTASSIUM CHLORIDE PO Take 10 mEq by mouth two (2) times a day.   Yes Provider, Historical   gabapentin (NEURONTIN) 300 mg capsule Take 2 Caps by mouth three (3) times daily.  Patient taking differently: Take 600 mg by mouth two (2) times a day. 03/04/17  Yes Shearon Stalls, NP   linaclotide Rolan Lipa) 145 mcg cap capsule Take 145 mcg by mouth Daily (before breakfast).   Yes Provider, Historical    hydroxychloroquine (PLAQUENIL) 200 mg tablet Take 200 mg by mouth two (2) times a day.   Yes Other, Phys, MD   valacyclovir (VALTREX) 1 g tablet Take 1,000 mg by mouth every morning. 08/28/09  Yes Other, Phys, MD   duloxetine (CYMBALTA) 60 mg capsule Take 60 mg by mouth two (2) times a day. 08/28/09  Yes Other, Phys, MD   triamterene-hydrochlorothiazide (DYAZIDE) 37.5-25 mg per capsule Take 1 Cap by mouth daily.   Yes Other, Phys, MD   ergocalciferol (VITAMIN D) 50,000 unit capsule Take 50,000 Units by mouth every seven (7) days. 08/28/09  Yes Other, Phys, MD   azelastine (ASTELIN) 137 mcg nasal spray 1 Spray by Nasal route two (2) times a day. Administer to right and left nostril.   Yes Other, Phys, MD   prednisoLONE acetate (PRED FORTE) 1 % ophthalmic suspension Administer 1 Drop to right eye every morning. 08/28/09  Yes Other, Phys, MD   topiramate (TOPAMAX) 200 mg tablet Take 200 mg by mouth two (2) times a day.    Provider, Historical   cetirizine HCl (CETIRIZINE PO) Take 10 mg by mouth every morning.    Provider, Historical       Physical Exam:   General: alert, no distress   HEENT: Head: Normocephalic, no lesions, without obvious abnormality.   Heart: regular rate and rhythm, S1, S2 normal, no murmur, click, rub or gallop   Lungs: chest clear, no wheezing, rales, normal symmetric air entry   Abdominal: soft NT/ND + BS   Neurological: Grossly normal   Extremities: extremities normal, atraumatic, no cyanosis or edema     Findings/Diagnosis: LLQ pain    Plan of Care/Planned Procedure: Colonoscopy

## 2018-09-15 NOTE — Progress Notes (Signed)
Endoscope was pre-cleaned at bedside immediately following procedure by Horris Latino Radden ET    Anesthesia reports 280mg  Propofol,  and 338mL NS given during procedure.  Received report from anesthesia staff on vital signs and status of patient.      Sigmoid colon polyp not retrieved. Dr. Samantha Crimes notified

## 2018-09-15 NOTE — Other (Signed)
Katherine [name]  04/18/1956  [id number]    Situation:  Verbal report received from: Harriette Bouillon and Deb RN  Procedure: Procedure(s):  COLONOSCOPY  COLON BIOPSY  ENDOSCOPIC POLYPECTOMY    Background:    Preoperative diagnosis: CONSTIPATION, DIARRHEA, LEFT LOWER QUAD PAIN  Postoperative diagnosis: Colon Polyp and Diverticulosis    Operator:  Dr. Samantha Crimes  Assistant(s): Endoscopy Technician-1: Celine Mans  Endoscopy RN-1: Sherryl Barters, RN  Endoscopy RN-2: Judee Clara    Specimens:   ID Type Source Tests Collected by Time Destination   1 : Random Colon Biopsy Preservative Random colon  Christin Bach, MD 09/15/2018 0802 Pathology     H. Pylori  no    Assessment:  Intra-procedure medications       Anesthesia gave intra-procedure sedation and medications, see anesthesia flow sheet yes    Intravenous fluids: NS@ KVO     Vital signs stable         Abdominal assessment: round and soft       Recommendation:  Discharge patient per MD order.  Family or Friend  Ronnie Fiance  Permission to share finding with family or friend yes

## 2018-10-19 ENCOUNTER — Encounter

## 2018-11-03 ENCOUNTER — Inpatient Hospital Stay: Payer: MEDICARE | Attending: Family Medicine | Primary: Family Medicine

## 2019-01-19 ENCOUNTER — Encounter

## 2019-02-08 ENCOUNTER — Inpatient Hospital Stay: Admit: 2019-02-08 | Payer: MEDICARE | Attending: Family Medicine | Primary: Family Medicine

## 2019-02-08 ENCOUNTER — Ambulatory Visit: Payer: MEDICARE | Primary: Family Medicine

## 2019-02-08 DIAGNOSIS — Z1231 Encounter for screening mammogram for malignant neoplasm of breast: Secondary | ICD-10-CM

## 2019-02-14 ENCOUNTER — Inpatient Hospital Stay: Admit: 2019-02-14 | Payer: MEDICARE | Attending: Family Medicine | Primary: Family Medicine

## 2019-02-14 DIAGNOSIS — M810 Age-related osteoporosis without current pathological fracture: Secondary | ICD-10-CM

## 2019-06-20 ENCOUNTER — Encounter

## 2019-06-20 ENCOUNTER — Inpatient Hospital Stay: Admit: 2019-06-20 | Payer: MEDICARE | Attending: Family Medicine | Primary: Family Medicine

## 2019-06-20 DIAGNOSIS — K561 Intussusception: Secondary | ICD-10-CM

## 2019-06-20 LAB — CREATININE, POC
Creatinine (POC): 0.7 mg/dL (ref 0.6–1.3)
GFRAA, POC: >60 mL/min/{1.73_m2} (ref >60)
GFRNA, POC: >60 mL/min/{1.73_m2} (ref >60)

## 2019-06-20 LAB — AMB POC CREATININE
GFR African American: >60 mL/min/{1.73_m2} (ref >60)
GFR Non-African American: >60 mL/min/{1.73_m2} (ref >60)
POC Creatinine: 0.7 mg/dL (ref 0.6–1.3)

## 2019-06-20 MED ORDER — SODIUM CHLORIDE 0.9 % IJ SYRG
Freq: Once | INTRAMUSCULAR | Status: AC
Start: 2019-06-20 — End: 2019-06-20
  Administered 2019-06-20: 22:00:00 via INTRAVENOUS

## 2019-06-20 MED ORDER — BARIUM SULFATE 2 % ORAL SUSP
2.1 % (w/v), 2.0 % (w/w) | Freq: Once | ORAL | Status: AC
Start: 2019-06-20 — End: 2019-06-20
  Administered 2019-06-20: 22:00:00 via ORAL

## 2019-06-20 MED ORDER — IOPAMIDOL 76 % IV SOLN
37076 mg iodine /mL (76 %) | Freq: Once | INTRAVENOUS | Status: AC
Start: 2019-06-20 — End: 2019-06-20
  Administered 2019-06-20: 22:00:00 via INTRAVENOUS

## 2019-06-20 MED ORDER — SODIUM CHLORIDE 0.9 % IV
Freq: Once | INTRAVENOUS | Status: AC
Start: 2019-06-20 — End: 2019-06-20
  Administered 2019-06-20: 22:00:00 via INTRAVENOUS

## 2019-06-20 MED FILL — NORMAL SALINE FLUSH 0.9 % INJECTION SYRINGE: INTRAMUSCULAR | Qty: 10

## 2019-06-20 MED FILL — ISOVUE-370  76 % INTRAVENOUS SOLUTION: 370 mg iodine /mL (76 %) | INTRAVENOUS | Qty: 100

## 2019-06-20 MED FILL — SODIUM CHLORIDE 0.9 % IV: INTRAVENOUS | Qty: 100

## 2019-06-20 MED FILL — READI-CAT 2  2.1 % (W/V), 2.0 % (W/W) ORAL SUSPENSION: 2.1 % (w/v), 2.0 % (w/w) | ORAL | Qty: 900

## 2019-06-28 ENCOUNTER — Encounter

## 2019-06-28 ENCOUNTER — Ambulatory Visit: Admit: 2019-06-28 | Discharge: 2019-06-28 | Payer: MEDICARE | Attending: Surgery | Primary: Family Medicine

## 2019-06-28 ENCOUNTER — Ambulatory Visit: Attending: Surgery | Primary: Family Medicine

## 2019-06-28 DIAGNOSIS — R1032 Left lower quadrant pain: Secondary | ICD-10-CM

## 2019-06-28 NOTE — Progress Notes (Signed)
 Chief Complaint   Patient presents with   . Abdominal Pain     Seen at the request of Dr. Excell for the evaluation of LLQ pain      1. Have you been to the ER, urgent care clinic since your last visit?  Hospitalized since your last visit?No    2. Have you seen or consulted any other health care providers outside of the [facility] since your last visit?  Include any pap smears or colon screening. No

## 2019-06-28 NOTE — Progress Notes (Signed)
Progress Notes by Luellen Pucker, MD at 06/28/19 1100                Author: Luellen Pucker, MD  Service: --  Author Type: Physician       Filed: 06/28/19 1219  Encounter Date: 06/28/2019  Status: Signed          Editor: Luellen Pucker, MD (Physician)               HISTORY OF PRESENT ILLNESS   Katherine [name] is a 63 y.o.  female who comes in for consultation by Danella Penton, MD  for LLQ pain   HPI   She has had some chronic LLQ pain for years associated with constipation.  She had a colonoscopy by Dr Lawson Radar in Jan 2020 showing some colitis.   Over the last 2-3 weeks she has  had increased pain and abdominal tightness and heartburn.  She saw Danella Penton, MD who recommended she go to the ER.  A CT noted a possible  small bowel intussusception in the LLQ.   She states she is still eating although with reduced appetite but remains constipated.  She is on chronic hydrocodone and Linzess.   She reports diarrhea after the constipation finally resolves.   She has some  nausea as well as bloating.   She denies melena, hematochezia, dysuria or hematuria.   She previously has had diverticulitis with resection?, and cholecystectomy.   She denies fever, chills or sweats.        Past Medical History:        Diagnosis  Date         ?  Adverse effect of anesthesia            hard time breathing after surgery, sister hard to wake up         ?  Arthritis       ?  Burning with urination       ?  Depression       ?  Diarrhea       ?  Diverticulosis       ?  Dizziness       ?  Easy bruising       ?  Fibromyalgia            chronic pain         ?  Headache(784.0)       ?  Hearing loss       ?  Ill-defined condition            past hx of shingles--eye/ear 13 years ago         ?  Lupus ([place])       ?  Meniere disease       ?  Night sweats       ?  Numbness            feet         ?  Rheumatoid arthritis, unspecified ([place])       ?  Sleep apnea            did not tolerate cpap         ?  Sweats,  menopausal           ?  Visual loss            Past Surgical History:         Procedure  Laterality  Date          ?  COLONOSCOPY  N/A  09/15/2018          COLONOSCOPY performed by Christin Bach, MD at [facility]          ?  HX APPENDECTOMY         ?  HX BREAST BIOPSY  Left            benign core bx          ?  HX CARPAL TUNNEL RELEASE  Right  01/2014          with trigger finger          ?  HX CHOLECYSTECTOMY         ?  HX COLECTOMY    1994          1 foot of colon removed          ?  HX COLONOSCOPY         ?  HX CORNEAL TRANSPLANT  Right       ?  HX ENDOSCOPY              with dilatation          ?  HX GYN              hysterectomy          ?  HX HEENT              two ear surgeries to try to fix meniere's disease, second one they used gentimycin, ruined her balance always dizzy          ?  HX HERNIA REPAIR              left, incisional after first bladder repair          ?  HX KNEE ARTHROSCOPY  Right       ?  HX KNEE REPLACEMENT  Right       ?  HX [place]          back x2, no hardware          ?  HX TONSILLECTOMY         ?  HX UROLOGICAL              bladder surgery x2          Family History         Problem  Relation  Age of Onset          ?  Heart Disease  Mother       ?  Cancer  Mother                breast cancer          ?  Breast Cancer  Mother  82     ?  Hypertension  Father       ?  Diabetes  Father            ?  Heart Disease  Sister            Social History          Tobacco Use         ?  Smoking status:  Current Every Day Smoker              Packs/day:  0.50         Years:  30.00         Pack years:  15.00         ?  Smokeless tobacco:  Never Used       Substance Use Topics         ?  Alcohol use:  Yes             Comment: rarely         ?  Drug use:  Never          Current Outpatient Medications        Medication  Sig         ?  dexAMETHasone (DECADRON) 4 mg tablet  Take 4 mg by mouth two (2) times daily (with meals).     ?  HYDROcodone-acetaminophen (NORCO) 10-325 mg tablet   Take 1 Tab by mouth every eight (8) hours as needed for Pain.     ?  tiZANidine (ZANAFLEX) 4 mg capsule  Take 4 mg by mouth three (3) times daily.     ?  topiramate (TOPAMAX) 200 mg tablet  Take 200 mg by mouth two (2) times a day.     ?  flaxseed oil (OMEGA 3 PO)  Take 1 Cap by mouth daily.     ?  cetirizine HCl (CETIRIZINE PO)  Take 10 mg by mouth every morning.     ?  nortriptyline (PAMELOR) 50 mg capsule  Take 50 mg by mouth nightly.         ?  prenatal vit 91/iron/folic/dha (PRENATAL + DHA PO)  Take 1 Tab by mouth daily.         ?  POTASSIUM CHLORIDE PO  Take 10 mEq by mouth two (2) times a day.     ?  gabapentin (NEURONTIN) 300 mg capsule  Take 2 Caps by mouth three (3) times daily. (Patient taking differently: Take 600 mg by mouth two (2) times a day.)     ?  linaclotide (LINZESS) 145 mcg cap capsule  Take 145 mcg by mouth Daily (before breakfast).     ?  hydroxychloroquine (PLAQUENIL) 200 mg tablet  Take 200 mg by mouth two (2) times a day.     ?  valacyclovir (VALTREX) 1 g tablet  Take 1,000 mg by mouth every morning.     ?  duloxetine (CYMBALTA) 60 mg capsule  Take 60 mg by mouth two (2) times a day.     ?  triamterene-hydrochlorothiazide (DYAZIDE) 37.5-25 mg per capsule  Take 1 Cap by mouth daily.     ?  ergocalciferol (VITAMIN D) 50,000 unit capsule  Take 50,000 Units by mouth every seven (7) days.     ?  azelastine (ASTELIN) 137 mcg nasal spray  1 Spray by Nasal route two (2) times a day. Administer to right and left nostril.         ?  prednisoLONE acetate (PRED FORTE) 1 % ophthalmic suspension  Administer 1 Drop to right eye every morning.          No current facility-administered medications for this visit.           Allergies        Allergen  Reactions         ?  Morphine  Other (comments)             "it does not stop the pain"         ?  Pcn [Penicillins]  Rash and Swelling           Review of Systems    Constitutional: Negative for chills, diaphoresis, fever and weight  loss.    HENT: Positive for  hearing loss. Negative for sore throat.     Eyes: Negative for blurred vision and discharge.    Respiratory: Negative for cough, shortness of breath and wheezing.     Cardiovascular: Negative for chest pain, palpitations, orthopnea, claudication and leg swelling.    Gastrointestinal: Positive for abdominal pain, constipation , diarrhea, heartburn and  nausea. Negative for melena and vomiting.    Genitourinary: Positive for frequency. Negative for dysuria, flank pain, hematuria and urgency.    Musculoskeletal: Negative for back pain, joint pain, myalgias and neck pain.    Skin: Negative for rash.    Neurological: Positive for dizziness, sensory change  (numbness) and headaches . Negative for speech change, focal weakness, seizures, loss of consciousness and weakness.    Endo/Heme/Allergies: Does not bruise/bleed easily.    Psychiatric/Behavioral: Positive for depression. Negative for memory loss. The patient  is nervous/anxious.        Visit Vitals      BP  (!) 114/54 (BP 1 Location: Left arm, BP Patient Position: Sitting)     Pulse  89     Temp  98.3 ??F (36.8 ??C) (Temporal)     Resp  16     Ht  5\' 6"  (1.676 m)     Wt  98 kg (216 lb 1.6 oz)     SpO2  94%        BMI  34.88 kg/m??           Physical Exam   Constitutional :        General: She is not in acute distress.     Appearance: She is well-developed. She is not diaphoretic.    HENT:       Head: Normocephalic and atraumatic.      Mouth/Throat:      Pharynx: No oropharyngeal exudate.    Eyes:       General: No scleral icterus.     Conjunctiva/sclera: Conjunctivae normal.      Pupils: Pupils are equal, round, and reactive to light.   Neck:       Musculoskeletal: Normal range of motion and neck supple.      Thyroid: No thyromegaly.      Vascular: No JVD.      Trachea: No tracheal deviation.   Cardiovascular:       Rate and Rhythm: Normal rate and regular rhythm.      Heart sounds: No murmur. No friction rub. No gallop.     Pulmonary:       Effort: Pulmonary effort is  normal. No respiratory distress.      Breath sounds: Normal breath sounds. No wheezing or rales.    Abdominal:      General: Bowel sounds are normal. There is no distension.      Palpations: Abdomen is soft. There is no mass.      Tenderness: There is  abdominal tenderness in the left lower quadrant. There is no right CVA tenderness,  left CVA tenderness, guarding or rebound. Negative signs include Murphy's sign, Rovsing's sign and McBurney's sign.      Hernia: There is no hernia in the umbilical area, ventral area, left inguinal area or right inguinal area.           Musculoskeletal: Normal range of motion.     Lymphadenopathy:       Cervical: No cervical adenopathy.   Skin :      General: Skin is warm and  dry.      Coloration: Skin is not pale.      Findings: No erythema or rash.    Neurological:       Mental Status: She is alert and oriented to person, place, and time.      Cranial Nerves: No cranial nerve deficit.    Psychiatric:         Behavior: Behavior normal.         Thought Content: Thought content normal.         Judgment: Judgment normal.          I reviewed her CT images today      ASSESSMENT and PLAN   1.  LLQ pain in setting of chronic constipation, hx colitis, hx diverticulitis, chronic narcotic usage and possible small bowel intussusception on recent CT.   This is a very complex situation.  Intussusception is frequently seen incidentally on CTs and  it is unclear if this is the source of her symptoms.  I discussed this with her today      Will get a CT enterography to assess the possible intussusception   If no further intussusception would have Dr Samantha Crimes evaluate for possible pill cam      2.  Fibromyalgia.  On chronic opiods.   3.  IBS-C may need higher dose of Linzess or additional rx   4.  Lupus  On decadron   5.  Chronic back pain with radiculopathy.  On hydrocodone      Will call with CT results   Discussed the situation with Dr Samantha Crimes today         Luellen Pucker, MD FACS

## 2019-06-28 NOTE — Progress Notes (Signed)
Chief Complaint   Patient presents with   ??? Abdominal Pain     Seen at the request of Dr. Glynda Jaeger for the evaluation of LLQ pain      1. Have you been to the ER, urgent care clinic since your last visit?  Hospitalized since your last visit?No    2. Have you seen or consulted any other health care providers outside of the [facility] since your last visit?  Include any pap smears or colon screening. No

## 2019-06-28 NOTE — Progress Notes (Signed)
HISTORY OF PRESENT ILLNESS  Katherine [name] is a 63 y.o. female who comes in for consultation by Danella Penton, MD for LLQ pain  HPI  She has had some chronic LLQ pain for years associated with constipation.  She had a colonoscopy by Dr Lawson Radar in Jan 2020 showing some colitis.   Over the last 2-3 weeks she has had increased pain and abdominal tightness and heartburn.  She saw Danella Penton, MD who recommended she go to the ER.  A CT noted a possible small bowel intussusception in the LLQ.   She states she is still eating although with reduced appetite but remains constipated.  She is on chronic hydrocodone and Linzess.   She reports diarrhea after the constipation finally resolves.   She has some nausea as well as bloating.   She denies melena, hematochezia, dysuria or hematuria.   She previously has had diverticulitis with resection?, and cholecystectomy.   She denies fever, chills or sweats.    Past Medical History:   Diagnosis Date   ??? Adverse effect of anesthesia     hard time breathing after surgery, sister hard to wake up   ??? Arthritis    ??? Burning with urination    ??? Depression    ??? Diarrhea    ??? Diverticulosis    ??? Dizziness    ??? Easy bruising    ??? Fibromyalgia     chronic pain   ??? Headache(784.0)    ??? Hearing loss    ??? Ill-defined condition     past hx of shingles--eye/ear 13 years ago   ??? Lupus ([place])    ??? Meniere disease    ??? Night sweats    ??? Numbness     feet   ??? Rheumatoid arthritis, unspecified ([place])    ??? Sleep apnea     did not tolerate cpap   ??? Sweats, menopausal    ??? Visual loss      Past Surgical History:   Procedure Laterality Date   ??? COLONOSCOPY N/A 09/15/2018    COLONOSCOPY performed by Christin Bach, MD at [facility]   ??? HX APPENDECTOMY     ??? HX BREAST BIOPSY Left     benign core bx   ??? HX CARPAL TUNNEL RELEASE Right 01/2014    with trigger finger   ??? HX CHOLECYSTECTOMY     ??? HX COLECTOMY  1994    1 foot of colon removed   ??? HX COLONOSCOPY      ??? HX CORNEAL TRANSPLANT Right    ??? HX ENDOSCOPY      with dilatation   ??? HX GYN      hysterectomy   ??? HX HEENT      two ear surgeries to try to fix meniere's disease, second one they used gentimycin, ruined her balance always dizzy   ??? HX HERNIA REPAIR      left, incisional after first bladder repair   ??? HX KNEE ARTHROSCOPY Right    ??? HX KNEE REPLACEMENT Right    ??? HX ORTHOPAEDIC  1982, 1991    back x2, no hardware   ??? HX TONSILLECTOMY     ??? HX UROLOGICAL      bladder surgery x2     Family History   Problem Relation Age of Onset   ??? Heart Disease Mother    ??? Cancer Mother         breast cancer   ??? Breast Cancer  Mother 61   ??? Hypertension Father    ??? Diabetes Father    ??? Heart Disease Sister      Social History     Tobacco Use   ??? Smoking status: Current Every Day Smoker     Packs/day: 0.50     Years: 30.00     Pack years: 15.00   ??? Smokeless tobacco: Never Used   Substance Use Topics   ??? Alcohol use: Yes     Comment: rarely   ??? Drug use: Never     Current Outpatient Medications   Medication Sig   ??? dexAMETHasone (DECADRON) 4 mg tablet Take 4 mg by mouth two (2) times daily (with meals).   ??? HYDROcodone-acetaminophen (NORCO) 10-325 mg tablet Take 1 Tab by mouth every eight (8) hours as needed for Pain.   ??? tiZANidine (ZANAFLEX) 4 mg capsule Take 4 mg by mouth three (3) times daily.   ??? topiramate (TOPAMAX) 200 mg tablet Take 200 mg by mouth two (2) times a day.   ??? flaxseed oil (OMEGA 3 PO) Take 1 Cap by mouth daily.   ??? cetirizine HCl (CETIRIZINE PO) Take 10 mg by mouth every morning.   ??? nortriptyline (PAMELOR) 50 mg capsule Take 50 mg by mouth nightly.   ??? prenatal vit 91/iron/folic/dha (PRENATAL + DHA PO) Take 1 Tab by mouth daily.   ??? POTASSIUM CHLORIDE PO Take 10 mEq by mouth two (2) times a day.   ??? gabapentin (NEURONTIN) 300 mg capsule Take 2 Caps by mouth three (3) times daily. (Patient taking differently: Take 600 mg by mouth two (2) times a day.)    ??? linaclotide (LINZESS) 145 mcg cap capsule Take 145 mcg by mouth Daily (before breakfast).   ??? hydroxychloroquine (PLAQUENIL) 200 mg tablet Take 200 mg by mouth two (2) times a day.   ??? valacyclovir (VALTREX) 1 g tablet Take 1,000 mg by mouth every morning.   ??? duloxetine (CYMBALTA) 60 mg capsule Take 60 mg by mouth two (2) times a day.   ??? triamterene-hydrochlorothiazide (DYAZIDE) 37.5-25 mg per capsule Take 1 Cap by mouth daily.   ??? ergocalciferol (VITAMIN D) 50,000 unit capsule Take 50,000 Units by mouth every seven (7) days.   ??? azelastine (ASTELIN) 137 mcg nasal spray 1 Spray by Nasal route two (2) times a day. Administer to right and left nostril.   ??? prednisoLONE acetate (PRED FORTE) 1 % ophthalmic suspension Administer 1 Drop to right eye every morning.     No current facility-administered medications for this visit.      Allergies   Allergen Reactions   ??? Morphine Other (comments)     "it does not stop the pain"   ??? Pcn [Penicillins] Rash and Swelling       Review of Systems   Constitutional: Negative for chills, diaphoresis, fever and weight loss.   HENT: Positive for hearing loss. Negative for sore throat.    Eyes: Negative for blurred vision and discharge.   Respiratory: Negative for cough, shortness of breath and wheezing.    Cardiovascular: Negative for chest pain, palpitations, orthopnea, claudication and leg swelling.   Gastrointestinal: Positive for abdominal pain, constipation, diarrhea, heartburn and nausea. Negative for melena and vomiting.   Genitourinary: Positive for frequency. Negative for dysuria, flank pain, hematuria and urgency.   Musculoskeletal: Negative for back pain, joint pain, myalgias and neck pain.   Skin: Negative for rash.   Neurological: Positive for dizziness, sensory change (numbness) and headaches. Negative  for speech change, focal weakness, seizures, loss of consciousness and weakness.   Endo/Heme/Allergies: Does not bruise/bleed easily.    Psychiatric/Behavioral: Positive for depression. Negative for memory loss. The patient is nervous/anxious.      Visit Vitals  BP (!) 114/54 (BP 1 Location: Left arm, BP Patient Position: Sitting)   Pulse 89   Temp 98.3 ??F (36.8 ??C) (Temporal)   Resp 16   Ht 5\' 6"  (1.676 m)   Wt 98 kg (216 lb 1.6 oz)   SpO2 94%   BMI 34.88 kg/m??       Physical Exam  Constitutional:       General: She is not in acute distress.     Appearance: She is well-developed. She is not diaphoretic.   HENT:      Head: Normocephalic and atraumatic.      Mouth/Throat:      Pharynx: No oropharyngeal exudate.   Eyes:      General: No scleral icterus.     Conjunctiva/sclera: Conjunctivae normal.      Pupils: Pupils are equal, round, and reactive to light.   Neck:      Musculoskeletal: Normal range of motion and neck supple.      Thyroid: No thyromegaly.      Vascular: No JVD.      Trachea: No tracheal deviation.   Cardiovascular:      Rate and Rhythm: Normal rate and regular rhythm.      Heart sounds: No murmur. No friction rub. No gallop.    Pulmonary:      Effort: Pulmonary effort is normal. No respiratory distress.      Breath sounds: Normal breath sounds. No wheezing or rales.   Abdominal:      General: Bowel sounds are normal. There is no distension.      Palpations: Abdomen is soft. There is no mass.      Tenderness: There is abdominal tenderness in the left lower quadrant. There is no right CVA tenderness, left CVA tenderness, guarding or rebound. Negative signs include Murphy's sign, Rovsing's sign and McBurney's sign.      Hernia: There is no hernia in the umbilical area, ventral area, left inguinal area or right inguinal area.       Musculoskeletal: Normal range of motion.   Lymphadenopathy:      Cervical: No cervical adenopathy.   Skin:     General: Skin is warm and dry.      Coloration: Skin is not pale.      Findings: No erythema or rash.   Neurological:      Mental Status: She is alert and oriented to person, place, and time.       Cranial Nerves: No cranial nerve deficit.   Psychiatric:         Behavior: Behavior normal.         Thought Content: Thought content normal.         Judgment: Judgment normal.       I reviewed her CT images today    ASSESSMENT and PLAN  1.  LLQ pain in setting of chronic constipation, hx colitis, hx diverticulitis, chronic narcotic usage and possible small bowel intussusception on recent CT.   This is a very complex situation.  Intussusception is frequently seen incidentally on CTs and it is unclear if this is the source of her symptoms.  I discussed this with her today    Will get a CT enterography to assess the possible intussusception  If  no further intussusception would have Dr Samantha Crimes evaluate for possible pill cam    2.  Fibromyalgia.  On chronic opiods.  3.  IBS-C may need higher dose of Linzess or additional rx  4.  Lupus  On decadron  5.  Chronic back pain with radiculopathy.  On hydrocodone    Will call with CT results  Discussed the situation with Dr Samantha Crimes today      Luellen Pucker, MD FACS

## 2019-06-30 ENCOUNTER — Inpatient Hospital Stay: Admit: 2019-06-30 | Payer: MEDICARE | Attending: Surgery | Primary: Family Medicine

## 2019-06-30 DIAGNOSIS — R1032 Left lower quadrant pain: Secondary | ICD-10-CM

## 2019-06-30 MED ORDER — IOHEXOL 240 MG/ML IV SOLN
240 mg iodine/mL | Freq: Once | INTRAVENOUS | Status: AC
Start: 2019-06-30 — End: 2019-06-30
  Administered 2019-06-30: 19:00:00 via ORAL

## 2019-06-30 MED ORDER — IOPAMIDOL 76 % IV SOLN
76 % | Freq: Once | INTRAVENOUS | Status: AC
Start: 2019-06-30 — End: 2019-06-30
  Administered 2019-06-30: 19:00:00 via INTRAVENOUS

## 2019-06-30 MED ORDER — SODIUM CHLORIDE 0.9 % IJ SYRG
Freq: Once | INTRAMUSCULAR | Status: AC
Start: 2019-06-30 — End: 2019-06-30
  Administered 2019-06-30: 19:00:00 via INTRAVENOUS

## 2019-06-30 MED FILL — OMNIPAQUE 240 MG IODINE/ML INTRAVENOUS SOLUTION: 240 mg iodine/mL | INTRAVENOUS | Qty: 50

## 2019-06-30 MED FILL — ISOVUE-370  76 % INTRAVENOUS SOLUTION: 370 mg iodine /mL (76 %) | INTRAVENOUS | Qty: 100

## 2019-07-01 NOTE — Telephone Encounter (Signed)
Patient calling wanting to know the results of her CT scan, and she also had a few questions to ask you as well. Please call patient.

## 2019-07-04 NOTE — Telephone Encounter (Signed)
CT was unremarkable.     The intussusception was no longer present (as expected)    Recommend  Follow up with Dr Samantha Crimes    Left message    Luellen Pucker, MD FACS

## 2019-08-11 ENCOUNTER — Encounter

## 2019-08-16 ENCOUNTER — Encounter: Attending: Adult Reconstructive Orthopaedic Surgery | Primary: Family Medicine

## 2019-11-16 ENCOUNTER — Encounter

## 2019-12-08 ENCOUNTER — Encounter

## 2019-12-08 ENCOUNTER — Ambulatory Visit: Admit: 2019-12-08 | Payer: MEDICARE | Attending: Neurology | Primary: Family Medicine

## 2019-12-08 ENCOUNTER — Ambulatory Visit: Attending: Neurology | Primary: Family Medicine

## 2019-12-08 DIAGNOSIS — G608 Other hereditary and idiopathic neuropathies: Secondary | ICD-10-CM

## 2019-12-08 MED ORDER — HYDROCODONE-ACETAMINOPHEN 10 MG-325 MG TAB
10-325 mg | ORAL_TABLET | Freq: Three times a day (TID) | ORAL | 0 refills | Status: AC | PRN
Start: 2019-12-08 — End: 2020-01-07

## 2019-12-08 MED ORDER — GABAPENTIN 300 MG CAP
300 mg | ORAL_CAPSULE | Freq: Two times a day (BID) | ORAL | 1 refills | Status: DC
Start: 2019-12-08 — End: 2021-04-08

## 2019-12-08 MED ORDER — PRIMIDONE 50 MG TAB
50 mg | ORAL_TABLET | ORAL | 5 refills | Status: DC
Start: 2019-12-08 — End: 2019-12-19

## 2019-12-08 NOTE — Progress Notes (Signed)
Chief Complaint   Patient presents with   . New Patient     c/o shaking, stated when she wakes up in the morning her upper and lower extreities shake, been happening for a long time   . Memory Loss     stated having difficulites getting words out       Concerns that her medication may be causing this.   Stated when she walks in stores she has to have a cart.  Stated she can't stand for only a short period of time due to her legs getting so shaky.

## 2019-12-08 NOTE — Progress Notes (Signed)
Consult  REFERRED BY:  Danella Penton, MD    CHIEF COMPLAINT: Hallucinations, memory loss, shaking episodes, altered mental status, increasing myoclonic jerks, generalized weakness, and loss of balance      Subjective:     Katherine [name] is a 64 y.o. right-handed Caucasian female seen as a new patient to me, at the request of Dr. Gilford Rile of a multiplicity of neurologic problems including hallucinations, memory loss, shaking episodes, increasing myoclonic jerks, spells of altered mental status and possibly passing out, generalized weakness loss of balance, and possible dementia.  She also has fibromyalgia, irritable bowel syndrome, and some abdominal pain for which she sees Dr. Revonda Humphrey.  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  She has had history of carpal tunnel surgery and trigger finger release.  She has no family history of dementia, no unusual history of head trauma, no unusual fever or meningismus, no toxin exposure, does have some depression but does not think she is majorly depressed or has pseudodementia secondary to depression.  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  Patient had a CT scan of the head done in 2017 for dizziness and unsteady gait and frequent falls that was normal, and she had an MRI of the lumbar spine in 2019 for the same problem, and that just shows mild to moderate spinal stenosis at L3-4 and mild degenerative changes at other lumbar vertebrae.  She had an MRI of the cervical spine in 2016 that showed moderate spinal stenosis at C5-6 and C6-7 because of gait problems and frequent falls then.  She complains of the same problem now, this been present for years, and says she is just clumsy, tends to fall easily, and does not know why but has an unsteady gait.  She appears depressed.  She says her memory  is slowly getting worse, she forgets things, she hallucinates, sometimes will see people sometimes hears people, and is increasingly myoclonic jerks and tremors in her hands and the point that she can hardly write or hold glasses.  She is currently on Mysoline 50 mg twice a day for essential tremor, and has never had a medication change, and is seen again after not being seen for about 10 years for the same problems.  He does have sleep apnea and rheumatoid arthritis in addition to her other problems but could not tolerate CPAP.  She just quit smoking after years of smoking because of a respiratory problem.  She takes Topamax for chronic headaches 200 mg twice a day, and takes 2 hydrocodone 3 times a day for pain in addition to gabapentin 300 mg twice a day for pain.  She is already on Pamelor 50 mg a day and Zanaflex 4 mg 3 times a day and Flexeril 10 mg 3 times a day and does complain of chronic drowsiness, and she is on Decadron and prednisone both supposedly for her lupus and arthritis, but I told her to check with her rheumatologist Dr. Durene Fruits because this a little unusual and there may be some mistake in her dose.  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  She does not think she is diabetic, and her last hemoglobin A1c was normal 2 years ago on the chart, but she has not had any recent B12  levels checked or thyroid.  She falls frequently and is afraid she is going to hurt herself, and has tried physical therapy without too much improvement in the past.  All of her previous work-ups were reviewed on the chart, MRI scans and CT scans reviewed personally the [facility] system, 9 years reports as above.    Past Medical History:   Diagnosis Date   ??? Adverse effect of anesthesia     hard time breathing after surgery, sister hard to wake up   ??? Arthritis    ??? Burning with urination    ???  Depression    ??? Diarrhea    ??? Diverticulosis    ??? Dizziness    ??? Easy bruising    ??? Fibromyalgia     chronic pain   ??? Headache(784.0)    ??? Hearing loss    ??? Ill-defined condition     past hx of shingles--eye/ear 13 years ago   ??? Lupus ([place])    ??? Meniere disease    ??? Night sweats    ??? Numbness     feet   ??? Rheumatoid arthritis, unspecified ([place])    ??? Sleep apnea     did not tolerate cpap   ??? Sweats, menopausal    ??? Visual loss       Past Surgical History:   Procedure Laterality Date   ??? COLONOSCOPY N/A 09/15/2018    COLONOSCOPY performed by Christin Bach, MD at [facility]   ??? HX APPENDECTOMY     ??? HX BREAST BIOPSY Left     benign core bx   ??? HX CARPAL TUNNEL RELEASE Right 01/2014    with trigger finger   ??? HX CHOLECYSTECTOMY     ??? HX COLONOSCOPY     ??? HX CORNEAL TRANSPLANT Right    ??? HX ENDOSCOPY      with dilatation   ??? HX GYN      hysterectomy   ??? HX HEENT      two ear surgeries to try to fix meniere's disease, second one they used gentimycin, ruined her balance always dizzy   ??? HX HERNIA REPAIR      left, incisional after first bladder repair   ??? HX KNEE ARTHROSCOPY Right    ??? HX KNEE REPLACEMENT Right    ??? HX ORTHOPAEDIC  1982, 1991    back x2, no hardware   ??? HX TONSILLECTOMY     ??? HX TOTAL COLECTOMY  1994    1 foot of colon removed   ??? HX UROLOGICAL      bladder surgery x2     Family History   Problem Relation Age of Onset   ??? Heart Disease Mother    ??? Cancer Mother         breast cancer   ??? Breast Cancer Mother 79   ??? Hypertension Father    ??? Diabetes Father    ??? Heart Disease Sister       Social History     Tobacco Use   ??? Smoking status: Former Smoker     Packs/day: 0.50     Years: 30.00     Pack years: 15.00   ??? Smokeless tobacco: Never Used   Substance Use Topics   ??? Alcohol use: Yes     Frequency: Monthly or less     Drinks per session: 1 or 2     Comment: rarely         Current Outpatient  Medications:   ???  albuterol (PROVENTIL HFA, VENTOLIN HFA, PROAIR HFA) 90 mcg/actuation inhaler, INHALE 1 TO 2  PUFFS BY MOUTH EVERY 4 TO 6 HOURS AS NEEDED, Disp: , Rfl:   ???  cyclobenzaprine (FLEXERIL) 10 mg tablet, cyclobenzaprine 10 mg tablet, Disp: , Rfl:   ???  famotidine (PEPCID) 40 mg tablet, TAKE 1 TABLET BY MOUTH TWICE DAILY AS DIRECTED, Disp: , Rfl:   ???  folic acid (FOLVITE) 1 mg tablet, TAKE 1 TABLET BY MOUTH EVERY DAY, Disp: , Rfl:   ???  methotrexate (RHEUMATREX) 2.5 mg tablet, , Disp: , Rfl:   ???  predniSONE (DELTASONE) 10 mg tablet, , Disp: , Rfl:   ???  gabapentin (NEURONTIN) 300 mg capsule, Take 1 Cap by mouth two (2) times a day. Max Daily Amount: 600 mg., Disp: 180 Cap, Rfl: 1  ???  HYDROcodone-acetaminophen (NORCO) 10-325 mg tablet, Take 2 Tabs by mouth every eight (8) hours as needed for Pain for up to 30 days. Max Daily Amount: 6 Tabs., Disp: 180 Tab, Rfl: 0  ???  primidone (Mysoline) 50 mg tablet, 1 po qam and 2 po qhs, Disp: 90 Tab, Rfl: 5  ???  dexAMETHasone (DECADRON) 4 mg tablet, Take 4 mg by mouth two (2) times daily (with meals)., Disp: , Rfl:   ???  tiZANidine (ZANAFLEX) 4 mg capsule, Take 4 mg by mouth three (3) times daily., Disp: , Rfl:   ???  topiramate (TOPAMAX) 200 mg tablet, Take 200 mg by mouth two (2) times a day., Disp: , Rfl:   ???  flaxseed oil (OMEGA 3 PO), Take 1 Cap by mouth daily., Disp: , Rfl:   ???  nortriptyline (PAMELOR) 50 mg capsule, Take 50 mg by mouth nightly., Disp: , Rfl:   ???  prenatal vit 91/iron/folic/dha (PRENATAL + DHA PO), Take 1 Tab by mouth daily., Disp: , Rfl:   ???  POTASSIUM CHLORIDE PO, Take 10 mEq by mouth two (2) times a day., Disp: , Rfl:   ???  linaclotide (LINZESS) 145 mcg cap capsule, Take 145 mcg by mouth Daily (before breakfast)., Disp: , Rfl:   ???  hydroxychloroquine (PLAQUENIL) 200 mg tablet, Take 200 mg by mouth two (2) times a day., Disp: , Rfl:   ???  valacyclovir (VALTREX) 1 g tablet, Take 500 mg by mouth every morning., Disp: , Rfl:   ???  duloxetine (CYMBALTA) 60 mg capsule, Take 60 mg by mouth two (2) times a day., Disp: , Rfl:   ???  triamterene-hydrochlorothiazide (DYAZIDE)  37.5-25 mg per capsule, Take 1 Cap by mouth daily., Disp: , Rfl:   ???  ergocalciferol (VITAMIN D) 50,000 unit capsule, Take 50,000 Units by mouth every seven (7) days., Disp: , Rfl:   ???  azelastine (ASTELIN) 137 mcg nasal spray, 1 Spray by Nasal route two (2) times a day. Administer to right and left nostril., Disp: , Rfl:   ???  prednisoLONE acetate (PRED FORTE) 1 % ophthalmic suspension, Administer 1 Drop to right eye every morning., Disp: , Rfl:         Allergies   Allergen Reactions   ??? Morphine Other (comments)     "it does not stop the pain"   ??? Pcn [Penicillins] Rash and Swelling      MRI Results (most recent):  Results from Hospital Encounter encounter on 03/03/18   MRI LUMB SPINE WO CONT    Narrative EXAM: MRI LUMB SPINE WO CONT    INDICATION: Low back pain, unspecified back  pain laterality, unspecified  chronicity, with sciatica presence unspecified. Low back pain    Exam: MRI of the lumbar spine. Sequences include sagittal and axial T1 and  T2-weighted images. Sagittal STIR. Coronal T2    Comparisons: March 13, 2015, August 10, 2008, December 14, 2007    Contrast: None.    Findings: There is borderline anterolisthesis of L3 on L4. Status post posterior  decompression at L5-S1. Multilevel endplate degenerative change without fracture  or marrow replacement. Cord terminus is within normal limits. Paraspinous soft  tissues are within normal limits.    T12-L1: Mild facet degenerative change without stenosis    L1-L2: Disc height loss with degeneration of this disc. Small bulge. Left facet  arthropathy and thickening of the ligamentum flavum. Slight narrowing of the  canal asymmetric to the left    L2-L3: There is a borderline disc bulge. There are bilateral facet degenerative  changes with thickening of the ligamentum flavum. Canal stenosis is mild,  unchanged    L3-L4: There is disc height loss with degeneration of this disc and a small  bulge. There are bilateral facet degenerative changes with thickening of  the  ligamentum flavum. Canal stenosis is mild to moderate with narrowing of the  subarticular zones and mild narrowing of the foramen, unchanged    L4-L5: Left foraminal protrusion. Disc height loss. There are mild facet  degenerative changes. This results in narrowing of the left subarticular zone  and mild left foraminal narrowing unchanged    L5-S1: This level has been decompressed without residual canal or foraminal  narrowing      Impression Impression:  1. Multilevel degenerative changes detailed above which has not significantly  progressed in the interval. Most significant at L3-L4  2. Postoperative changes L5-S1          Results from [facility] encounter on 03/03/18   MRI LUMB SPINE WO CONT    Narrative EXAM: MRI LUMB SPINE WO CONT    INDICATION: Low back pain, unspecified back pain laterality, unspecified  chronicity, with sciatica presence unspecified. Low back pain    Exam: MRI of the lumbar spine. Sequences include sagittal and axial T1 and  T2-weighted images. Sagittal STIR. Coronal T2    Comparisons: March 13, 2015, August 10, 2008, December 14, 2007    Contrast: None.    Findings: There is borderline anterolisthesis of L3 on L4. Status post posterior  decompression at L5-S1. Multilevel endplate degenerative change without fracture  or marrow replacement. Cord terminus is within normal limits. Paraspinous soft  tissues are within normal limits.    T12-L1: Mild facet degenerative change without stenosis    L1-L2: Disc height loss with degeneration of this disc. Small bulge. Left facet  arthropathy and thickening of the ligamentum flavum. Slight narrowing of the  canal asymmetric to the left    L2-L3: There is a borderline disc bulge. There are bilateral facet degenerative  changes with thickening of the ligamentum flavum. Canal stenosis is mild,  unchanged    L3-L4: There is disc height loss with degeneration of this disc and a small  bulge. There are bilateral facet degenerative changes with  thickening of the  ligamentum flavum. Canal stenosis is mild to moderate with narrowing of the  subarticular zones and mild narrowing of the foramen, unchanged    L4-L5: Left foraminal protrusion. Disc height loss. There are mild facet  degenerative changes. This results in narrowing of the left subarticular zone  and mild left foraminal narrowing unchanged  L5-S1: This level has been decompressed without residual canal or foraminal  narrowing      Impression Impression:  1. Multilevel degenerative changes detailed above which has not significantly  progressed in the interval. Most significant at L3-L4  2. Postoperative changes L5-S1        Review of Systems:  A comprehensive review of systems was negative except for: Constitutional: positive for fatigue and malaise  Ears, nose, mouth, throat, and face: positive for hearing loss and tinnitus  Respiratory: positive for dyspnea on exertion, emphysema or chronic bronchitis  Cardiovascular: positive for chest pressure/discomfort, dyspnea, dyspnea on exertion, dizziness  Musculoskeletal: positive for myalgias, arthralgias, stiff joints, neck pain, back pain and muscle weakness  Neurological: positive for headaches, dizziness, memory problems, paresthesia, coordination problems, gait problems and weakness  Behvioral/Psych: positive for Memory loss, anxiety, behavior problems and depression   Vitals:    12/08/19 0809   BP: 130/80   Pulse: 91   Resp: 20   Temp: 98 ??F (36.7 ??C)   SpO2: 97%   Weight: 216 lb (98 kg)   Height: 5' 6"  (1.676 m)     Objective:     I      NEUROLOGICAL EXAM:    Appearance:  The patient is fairly developed and nourished, provides a fair history and is in no acute distress.   Mental Status: Oriented to time, place and person, and the president, patient has difficulty doing serial sevens, can remember 2 of 3 words at 30 seconds, has a little difficulty spelling world backward but could draw a clock that shows the time 10:50 with effort.  Cognitive  function is borderline normal and speech is fluent and no aphasia or dysarthria. Mood and affect appropriate but very depressed.   Cranial Nerves:   Intact visual fields. Fundi are benign, disc are flat, no lesions seen on funduscopy. PERLA, EOM's full, no nystagmus, no ptosis. Facial sensation is normal. Corneal reflexes are not tested. Facial movement is symmetric. Hearing is abnormal bilaterally. Palate is midline with normal sternocleidomastoid and trapezius muscles are normal. Tongue is midline.  Neck without meningismus or bruits  Temporal arteries are not tender or enlarged  TMJ areas are not tender on palpation   Motor:  5/5 strength in upper and lower proximal and distal muscles. Normal bulk and tone. No fasciculations.  Rapid alternating movement is symmetric and intact bilaterally   Reflexes:   Deep tendon reflexes 2+/4 and symmetrical.  No babinski or clonus present   Sensory:   Normal to touch, pinprick and vibration and temperature slightly decreased in both feet.  DSS is intact   Gait:  Abnormal gait for patient's age, as patient has difficulty doing Romberg and tandem, and has a slightly wide-based and slightly stifflegged gait.  Appears just a little clumsy..   Tremor:   No tremor noted.   Cerebellar:   Mildly abnormal cerebellar signs present on Romberg and tandem testing and finger-nose-finger exam shows mild to moderate writing and intention tremor.   Neurovascular:  Normal heart sounds and regular rhythm, peripheral pulses decreased, and no carotid bruits.           Assessment:       ICD-10-CM ICD-9-CM    1. 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 GLYCOPROTEIN AB,IGM      ANA COMPREHENSIVE PLUS PANEL      IMMUNOELECTROPHORESIS (IMMUNOFIX.)      GM1 IGG AUTOANTIBODY      CK  CBC WITH AUTOMATED DIFF      SED RATE (ESR)      VITAMIN B12 & FOLATE      VITAMIN D, 25 HYDROXY      METABOLIC PANEL, COMPREHENSIVE      gabapentin (NEURONTIN) 300 mg capsule       primidone (Mysoline) 50 mg tablet   2. Cervical radiculopathy due to degenerative joint disease of spine  M47.22 721.0 HYDROcodone-acetaminophen (NORCO) 10-325 mg tablet      primidone (Mysoline) 50 mg tablet   3. Lumbar back pain with radiculopathy affecting left lower extremity  M54.16 724.4 HYDROcodone-acetaminophen (NORCO) 10-325 mg tablet      primidone (Mysoline) 50 mg tablet   4. Lumbar back pain with radiculopathy affecting right lower extremity  M54.16 724.4 HYDROcodone-acetaminophen (NORCO) 10-325 mg tablet      primidone (Mysoline) 50 mg tablet   5. Benign essential tremor syndrome  G25.0 333.1 MRI BRAIN W WO CONT      REFERRAL TO NEUROPSYCHOLOGY      primidone (Mysoline) 50 mg tablet   6. Syncope and collapse  R55 780.2 MRI BRAIN W WO CONT      NEURO EEG 24 HR      DUPLEX CAROTID BILATERAL      primidone (Mysoline) 50 mg tablet   7. Bilateral carotid artery stenosis  I65.23 433.10 DUPLEX CAROTID BILATERAL     433.30 primidone (Mysoline) 50 mg tablet   8. Convulsions, unspecified convulsion type ([place])  R56.9 780.39 MRI BRAIN W WO CONT      NEURO EEG 24 HR      primidone (Mysoline) 50 mg tablet   9. B12 deficiency  E53.8 266.2 VITAMIN B12 & FOLATE      primidone (Mysoline) 50 mg tablet   10. Diabetic peripheral neuropathy (HCC)  E11.42 250.60 HEMOGLOBIN A1C WITH EAG     357.2 primidone (Mysoline) 50 mg tablet   11. Vitamin D deficiency  E55.9 268.9 VITAMIN D, 25 HYDROXY      primidone (Mysoline) 50 mg tablet   12. Disturbance of memory  R41.3 780.93 MRI BRAIN W WO CONT      REFERRAL TO NEUROPSYCHOLOGY      primidone (Mysoline) 50 mg tablet     Active Problems:    * No active hospital problems. *      Plan:     Patient with multiple neurological symptoms and complaints at this time.  Patient #1 problem seems to be a gait issue, difficulty walking, and I am concerned because of her previous moderate spinal stenosis and neural canal 5 years ago, and her slightly spastic gait and hyperactive reflexes and stiff  leg gait slightly myelopathic type looking gait that she may have a myelopathy.  We will get an MRI of the cervical spine to check that.  In view of her progressive memory loss, dementia, increasing myoclonic jerks, unsteadiness, generalized weakness, cognitive loss, she needs an MRI of the brain to rule out structural disease of the brain rule out vascular disease of the brain without normal pressure hydrocephalus, rule out posterior fossa lesion, rule out multi-infarct dementia, or any other possible treatable cause of her symptoms.  Complete metabolic panel sent to rule out neuropathy, rule out memory loss including Q46 and folic acid and vitamin D and thyroid and she is to check back with her rheumatologist about taking both Decadron and prednisone and taking both Flexeril and Zanaflex to make sure she is not being overly medicated and she is  also already on a fairly good dose of narcotics taking 2 hydrocodone 3 times a day.  We will increase the Mysoline 50 mg in the morning and 100 mg at night and gradually titrate up as tolerated and needed.  65 minutes per the patient, going over her history, reviewing all her x-rays, reviewing her previous EMGs, reviewing all her other notes, and discussing her diagnosis prognosis treatment options all with her in detail.  For her fibromyalgia, she is going to work with her rheumatologist, consider regular exercise consider physical therapy and continue her current medications as prescribed by him.  She is advised that the main risk factors for stroke and vascular disease are high blood pressure cholesterol smoking and diabetes and poor diet, and to control these with her PCP do not smoke, and there is a warning signs of stroke as far as be fast as balance, eyes, face, arms, sensory in time.  For the memory issues she is advised the best treatment to stay physically mentally active, try to exercise mildly about 30 minutes a day, and take her vitamins and vitamin D on a  regular basis.  She is to follow-up with her rheumatologist and her internist for her medical problems and rheumatologic problems, and if her back gets worse she is to follow-up with Dr. Darnell Level or an orthopedic surgeon in the future.    Signed By: Shaune Pollack, MD     December 08, 2019       CC: Danella Penton, MD  FAX: [phone]

## 2019-12-08 NOTE — Progress Notes (Signed)
Consult  REFERRED BY:  Danella Penton, MD    CHIEF COMPLAINT: Hallucinations, memory loss, shaking episodes, altered mental status, increasing myoclonic jerks, generalized weakness, and loss of balance      Subjective:     Katherine [name] is a 64 y.o. right-handed Caucasian female seen as a new patient to me, at the request of Dr. Gilford Rile of a multiplicity of neurologic problems including hallucinations, memory loss, shaking episodes, increasing myoclonic jerks, spells of altered mental status and possibly passing out, generalized weakness loss of balance, and possible dementia.  She also has fibromyalgia, irritable bowel syndrome, and some abdominal pain for which she sees Dr. Revonda Humphrey.  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  She has had history of carpal tunnel surgery and trigger finger release.  She has no family history of dementia, no unusual history of head trauma, no unusual fever or meningismus, no toxin exposure, does have some depression but does not think she is majorly depressed or has pseudodementia secondary to depression.  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  Patient had a CT scan of the head done in 2017 for dizziness and unsteady gait and frequent falls that was normal, and she had an MRI of the lumbar spine in 2019 for the same problem, and that just shows mild to moderate spinal stenosis at L3-4 and mild degenerative changes at other lumbar vertebrae.  She had an MRI of the cervical spine in 2016 that showed moderate spinal stenosis at C5-6 and C6-7 because of gait problems and frequent falls then.  She complains of the same problem now, this been present for years, and says she is just clumsy, tends to fall easily, and does not know why but has an unsteady gait.  She appears depressed.  She says her memory  is slowly getting worse, she forgets things, she hallucinates, sometimes will see people sometimes hears people, and is increasingly myoclonic jerks and tremors in her hands and the point that she can hardly write or hold glasses.  She is currently on Mysoline 50 mg twice a day for essential tremor, and has never had a medication change, and is seen again after not being seen for about 10 years for the same problems.  He does have sleep apnea and rheumatoid arthritis in addition to her other problems but could not tolerate CPAP.  She just quit smoking after years of smoking because of a respiratory problem.  She takes Topamax for chronic headaches 200 mg twice a day, and takes 2 hydrocodone 3 times a day for pain in addition to gabapentin 300 mg twice a day for pain.  She is already on Pamelor 50 mg a day and Zanaflex 4 mg 3 times a day and Flexeril 10 mg 3 times a day and does complain of chronic drowsiness, and she is on Decadron and prednisone both supposedly for her lupus and arthritis, but I told her to check with her rheumatologist Dr. Durene Fruits because this a little unusual and there may be some mistake in her dose.  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  She does not think she is diabetic, and her last hemoglobin A1c was normal 2 years ago on the chart, but she has not had any recent B12  levels checked or thyroid.  She falls frequently and is afraid she is going to hurt herself, and has tried physical therapy without too much improvement in the past.  All of her previous work-ups were reviewed on the chart, MRI scans and CT scans reviewed personally the [facility] system, 9 years reports as above.    Past Medical History:   Diagnosis Date   ??? Adverse effect of anesthesia     hard time breathing after surgery, sister hard to wake up   ??? Arthritis    ??? Burning with urination    ???  Depression    ??? Diarrhea    ??? Diverticulosis    ??? Dizziness    ??? Easy bruising    ??? Fibromyalgia     chronic pain   ??? Headache(784.0)    ??? Hearing loss    ??? Ill-defined condition     past hx of shingles--eye/ear 13 years ago   ??? Lupus ([place])    ??? Meniere disease    ??? Night sweats    ??? Numbness     feet   ??? Rheumatoid arthritis, unspecified ([place])    ??? Sleep apnea     did not tolerate cpap   ??? Sweats, menopausal    ??? Visual loss       Past Surgical History:   Procedure Laterality Date   ??? COLONOSCOPY N/A 09/15/2018    COLONOSCOPY performed by Christin Bach, MD at [facility]   ??? HX APPENDECTOMY     ??? HX BREAST BIOPSY Left     benign core bx   ??? HX CARPAL TUNNEL RELEASE Right 01/2014    with trigger finger   ??? HX CHOLECYSTECTOMY     ??? HX COLONOSCOPY     ??? HX CORNEAL TRANSPLANT Right    ??? HX ENDOSCOPY      with dilatation   ??? HX GYN      hysterectomy   ??? HX HEENT      two ear surgeries to try to fix meniere's disease, second one they used gentimycin, ruined her balance always dizzy   ??? HX HERNIA REPAIR      left, incisional after first bladder repair   ??? HX KNEE ARTHROSCOPY Right    ??? HX KNEE REPLACEMENT Right    ??? HX ORTHOPAEDIC  1982, 1991    back x2, no hardware   ??? HX TONSILLECTOMY     ??? HX TOTAL COLECTOMY  1994    1 foot of colon removed   ??? HX UROLOGICAL      bladder surgery x2     Family History   Problem Relation Age of Onset   ??? Heart Disease Mother    ??? Cancer Mother         breast cancer   ??? Breast Cancer Mother 79   ??? Hypertension Father    ??? Diabetes Father    ??? Heart Disease Sister       Social History     Tobacco Use   ??? Smoking status: Former Smoker     Packs/day: 0.50     Years: 30.00     Pack years: 15.00   ??? Smokeless tobacco: Never Used   Substance Use Topics   ??? Alcohol use: Yes     Frequency: Monthly or less     Drinks per session: 1 or 2     Comment: rarely         Current Outpatient  Medications:   ???  albuterol (PROVENTIL HFA, VENTOLIN HFA, PROAIR HFA) 90 mcg/actuation inhaler, INHALE 1 TO 2  PUFFS BY MOUTH EVERY 4 TO 6 HOURS AS NEEDED, Disp: , Rfl:   ???  cyclobenzaprine (FLEXERIL) 10 mg tablet, cyclobenzaprine 10 mg tablet, Disp: , Rfl:   ???  famotidine (PEPCID) 40 mg tablet, TAKE 1 TABLET BY MOUTH TWICE DAILY AS DIRECTED, Disp: , Rfl:   ???  folic acid (FOLVITE) 1 mg tablet, TAKE 1 TABLET BY MOUTH EVERY DAY, Disp: , Rfl:   ???  methotrexate (RHEUMATREX) 2.5 mg tablet, , Disp: , Rfl:   ???  predniSONE (DELTASONE) 10 mg tablet, , Disp: , Rfl:   ???  gabapentin (NEURONTIN) 300 mg capsule, Take 1 Cap by mouth two (2) times a day. Max Daily Amount: 600 mg., Disp: 180 Cap, Rfl: 1  ???  HYDROcodone-acetaminophen (NORCO) 10-325 mg tablet, Take 2 Tabs by mouth every eight (8) hours as needed for Pain for up to 30 days. Max Daily Amount: 6 Tabs., Disp: 180 Tab, Rfl: 0  ???  primidone (Mysoline) 50 mg tablet, 1 po qam and 2 po qhs, Disp: 90 Tab, Rfl: 5  ???  dexAMETHasone (DECADRON) 4 mg tablet, Take 4 mg by mouth two (2) times daily (with meals)., Disp: , Rfl:   ???  tiZANidine (ZANAFLEX) 4 mg capsule, Take 4 mg by mouth three (3) times daily., Disp: , Rfl:   ???  topiramate (TOPAMAX) 200 mg tablet, Take 200 mg by mouth two (2) times a day., Disp: , Rfl:   ???  flaxseed oil (OMEGA 3 PO), Take 1 Cap by mouth daily., Disp: , Rfl:   ???  nortriptyline (PAMELOR) 50 mg capsule, Take 50 mg by mouth nightly., Disp: , Rfl:   ???  prenatal vit 91/iron/folic/dha (PRENATAL + DHA PO), Take 1 Tab by mouth daily., Disp: , Rfl:   ???  POTASSIUM CHLORIDE PO, Take 10 mEq by mouth two (2) times a day., Disp: , Rfl:   ???  linaclotide (LINZESS) 145 mcg cap capsule, Take 145 mcg by mouth Daily (before breakfast)., Disp: , Rfl:   ???  hydroxychloroquine (PLAQUENIL) 200 mg tablet, Take 200 mg by mouth two (2) times a day., Disp: , Rfl:   ???  valacyclovir (VALTREX) 1 g tablet, Take 500 mg by mouth every morning., Disp: , Rfl:   ???  duloxetine (CYMBALTA) 60 mg capsule, Take 60 mg by mouth two (2) times a day., Disp: , Rfl:   ???  triamterene-hydrochlorothiazide (DYAZIDE)  37.5-25 mg per capsule, Take 1 Cap by mouth daily., Disp: , Rfl:   ???  ergocalciferol (VITAMIN D) 50,000 unit capsule, Take 50,000 Units by mouth every seven (7) days., Disp: , Rfl:   ???  azelastine (ASTELIN) 137 mcg nasal spray, 1 Spray by Nasal route two (2) times a day. Administer to right and left nostril., Disp: , Rfl:   ???  prednisoLONE acetate (PRED FORTE) 1 % ophthalmic suspension, Administer 1 Drop to right eye every morning., Disp: , Rfl:         Allergies   Allergen Reactions   ??? Morphine Other (comments)     "it does not stop the pain"   ??? Pcn [Penicillins] Rash and Swelling      MRI Results (most recent):  Results from Hospital Encounter encounter on 03/03/18   MRI LUMB SPINE WO CONT    Narrative EXAM: MRI LUMB SPINE WO CONT    INDICATION: Low back pain, unspecified back  pain laterality, unspecified  chronicity, with sciatica presence unspecified. Low back pain    Exam: MRI of the lumbar spine. Sequences include sagittal and axial T1 and  T2-weighted images. Sagittal STIR. Coronal T2    Comparisons: March 13, 2015, August 10, 2008, December 14, 2007    Contrast: None.    Findings: There is borderline anterolisthesis of L3 on L4. Status post posterior  decompression at L5-S1. Multilevel endplate degenerative change without fracture  or marrow replacement. Cord terminus is within normal limits. Paraspinous soft  tissues are within normal limits.    T12-L1: Mild facet degenerative change without stenosis    L1-L2: Disc height loss with degeneration of this disc. Small bulge. Left facet  arthropathy and thickening of the ligamentum flavum. Slight narrowing of the  canal asymmetric to the left    L2-L3: There is a borderline disc bulge. There are bilateral facet degenerative  changes with thickening of the ligamentum flavum. Canal stenosis is mild,  unchanged    L3-L4: There is disc height loss with degeneration of this disc and a small  bulge. There are bilateral facet degenerative changes with thickening of the   ligamentum flavum. Canal stenosis is mild to moderate with narrowing of the  subarticular zones and mild narrowing of the foramen, unchanged    L4-L5: Left foraminal protrusion. Disc height loss. There are mild facet  degenerative changes. This results in narrowing of the left subarticular zone  and mild left foraminal narrowing unchanged    L5-S1: This level has been decompressed without residual canal or foraminal  narrowing      Impression Impression:  1. Multilevel degenerative changes detailed above which has not significantly  progressed in the interval. Most significant at L3-L4  2. Postoperative changes L5-S1          Results from Echo encounter on 03/03/18   MRI LUMB SPINE WO CONT    Narrative EXAM: MRI LUMB SPINE WO CONT    INDICATION: Low back pain, unspecified back pain laterality, unspecified  chronicity, with sciatica presence unspecified. Low back pain    Exam: MRI of the lumbar spine. Sequences include sagittal and axial T1 and  T2-weighted images. Sagittal STIR. Coronal T2    Comparisons: March 13, 2015, August 10, 2008, December 14, 2007    Contrast: None.    Findings: There is borderline anterolisthesis of L3 on L4. Status post posterior  decompression at L5-S1. Multilevel endplate degenerative change without fracture  or marrow replacement. Cord terminus is within normal limits. Paraspinous soft  tissues are within normal limits.    T12-L1: Mild facet degenerative change without stenosis    L1-L2: Disc height loss with degeneration of this disc. Small bulge. Left facet  arthropathy and thickening of the ligamentum flavum. Slight narrowing of the  canal asymmetric to the left    L2-L3: There is a borderline disc bulge. There are bilateral facet degenerative  changes with thickening of the ligamentum flavum. Canal stenosis is mild,  unchanged    L3-L4: There is disc height loss with degeneration of this disc and a small  bulge. There are bilateral facet degenerative changes with  thickening of the  ligamentum flavum. Canal stenosis is mild to moderate with narrowing of the  subarticular zones and mild narrowing of the foramen, unchanged    L4-L5: Left foraminal protrusion. Disc height loss. There are mild facet  degenerative changes. This results in narrowing of the left subarticular zone  and mild left foraminal narrowing unchanged  L5-S1: This level has been decompressed without residual canal or foraminal  narrowing      Impression Impression:  1. Multilevel degenerative changes detailed above which has not significantly  progressed in the interval. Most significant at L3-L4  2. Postoperative changes L5-S1        Review of Systems:  A comprehensive review of systems was negative except for: Constitutional: positive for fatigue and malaise  Ears, nose, mouth, throat, and face: positive for hearing loss and tinnitus  Respiratory: positive for dyspnea on exertion, emphysema or chronic bronchitis  Cardiovascular: positive for chest pressure/discomfort, dyspnea, dyspnea on exertion, dizziness  Musculoskeletal: positive for myalgias, arthralgias, stiff joints, neck pain, back pain and muscle weakness  Neurological: positive for headaches, dizziness, memory problems, paresthesia, coordination problems, gait problems and weakness  Behvioral/Psych: positive for Memory loss, anxiety, behavior problems and depression   Vitals:    12/08/19 0809   BP: 130/80   Pulse: 91   Resp: 20   Temp: 98 ??F (36.7 ??C)   SpO2: 97%   Weight: 216 lb (98 kg)   Height: 5' 6"  (1.676 m)     Objective:     I      NEUROLOGICAL EXAM:    Appearance:  The patient is fairly developed and nourished, provides a fair history and is in no acute distress.   Mental Status: Oriented to time, place and person, and the president, patient has difficulty doing serial sevens, can remember 2 of 3 words at 30 seconds, has a little difficulty spelling world backward but could draw a clock that shows the time 10:50 with effort.  Cognitive  function is borderline normal and speech is fluent and no aphasia or dysarthria. Mood and affect appropriate but very depressed.   Cranial Nerves:   Intact visual fields. Fundi are benign, disc are flat, no lesions seen on funduscopy. PERLA, EOM's full, no nystagmus, no ptosis. Facial sensation is normal. Corneal reflexes are not tested. Facial movement is symmetric. Hearing is abnormal bilaterally. Palate is midline with normal sternocleidomastoid and trapezius muscles are normal. Tongue is midline.  Neck without meningismus or bruits  Temporal arteries are not tender or enlarged  TMJ areas are not tender on palpation   Motor:  5/5 strength in upper and lower proximal and distal muscles. Normal bulk and tone. No fasciculations.  Rapid alternating movement is symmetric and intact bilaterally   Reflexes:   Deep tendon reflexes 2+/4 and symmetrical.  No babinski or clonus present   Sensory:   Normal to touch, pinprick and vibration and temperature slightly decreased in both feet.  DSS is intact   Gait:  Abnormal gait for patient's age, as patient has difficulty doing Romberg and tandem, and has a slightly wide-based and slightly stifflegged gait.  Appears just a little clumsy..   Tremor:   No tremor noted.   Cerebellar:   Mildly abnormal cerebellar signs present on Romberg and tandem testing and finger-nose-finger exam shows mild to moderate writing and intention tremor.   Neurovascular:  Normal heart sounds and regular rhythm, peripheral pulses decreased, and no carotid bruits.           Assessment:       ICD-10-CM ICD-9-CM    1. 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 GLYCOPROTEIN AB,IGM      ANA COMPREHENSIVE PLUS PANEL      IMMUNOELECTROPHORESIS (IMMUNOFIX.)      GM1 IGG AUTOANTIBODY      CK  CBC WITH AUTOMATED DIFF      SED RATE (ESR)      VITAMIN B12 & FOLATE      VITAMIN D, 25 HYDROXY      METABOLIC PANEL, COMPREHENSIVE      gabapentin (NEURONTIN) 300 mg capsule       primidone (Mysoline) 50 mg tablet   2. Cervical radiculopathy due to degenerative joint disease of spine  M47.22 721.0 HYDROcodone-acetaminophen (NORCO) 10-325 mg tablet      primidone (Mysoline) 50 mg tablet   3. Lumbar back pain with radiculopathy affecting left lower extremity  M54.16 724.4 HYDROcodone-acetaminophen (NORCO) 10-325 mg tablet      primidone (Mysoline) 50 mg tablet   4. Lumbar back pain with radiculopathy affecting right lower extremity  M54.16 724.4 HYDROcodone-acetaminophen (NORCO) 10-325 mg tablet      primidone (Mysoline) 50 mg tablet   5. Benign essential tremor syndrome  G25.0 333.1 MRI BRAIN W WO CONT      REFERRAL TO NEUROPSYCHOLOGY      primidone (Mysoline) 50 mg tablet   6. Syncope and collapse  R55 780.2 MRI BRAIN W WO CONT      NEURO EEG 24 HR      DUPLEX CAROTID BILATERAL      primidone (Mysoline) 50 mg tablet   7. Bilateral carotid artery stenosis  I65.23 433.10 DUPLEX CAROTID BILATERAL     433.30 primidone (Mysoline) 50 mg tablet   8. Convulsions, unspecified convulsion type ([place])  R56.9 780.39 MRI BRAIN W WO CONT      NEURO EEG 24 HR      primidone (Mysoline) 50 mg tablet   9. B12 deficiency  E53.8 266.2 VITAMIN B12 & FOLATE      primidone (Mysoline) 50 mg tablet   10. Diabetic peripheral neuropathy (HCC)  E11.42 250.60 HEMOGLOBIN A1C WITH EAG     357.2 primidone (Mysoline) 50 mg tablet   11. Vitamin D deficiency  E55.9 268.9 VITAMIN D, 25 HYDROXY      primidone (Mysoline) 50 mg tablet   12. Disturbance of memory  R41.3 780.93 MRI BRAIN W WO CONT      REFERRAL TO NEUROPSYCHOLOGY      primidone (Mysoline) 50 mg tablet     Active Problems:    * No active hospital problems. *      Plan:     Patient with multiple neurological symptoms and complaints at this time.  Patient #1 problem seems to be a gait issue, difficulty walking, and I am concerned because of her previous moderate spinal stenosis and neural canal 5 years ago, and her slightly spastic gait and hyperactive reflexes and stiff  leg gait slightly myelopathic type looking gait that she may have a myelopathy.  We will get an MRI of the cervical spine to check that.  In view of her progressive memory loss, dementia, increasing myoclonic jerks, unsteadiness, generalized weakness, cognitive loss, she needs an MRI of the brain to rule out structural disease of the brain rule out vascular disease of the brain without normal pressure hydrocephalus, rule out posterior fossa lesion, rule out multi-infarct dementia, or any other possible treatable cause of her symptoms.  Complete metabolic panel sent to rule out neuropathy, rule out memory loss including Q46 and folic acid and vitamin D and thyroid and she is to check back with her rheumatologist about taking both Decadron and prednisone and taking both Flexeril and Zanaflex to make sure she is not being overly medicated and she is  also already on a fairly good dose of narcotics taking 2 hydrocodone 3 times a day.  We will increase the Mysoline 50 mg in the morning and 100 mg at night and gradually titrate up as tolerated and needed.  65 minutes per the patient, going over her history, reviewing all her x-rays, reviewing her previous EMGs, reviewing all her other notes, and discussing her diagnosis prognosis treatment options all with her in detail.  For her fibromyalgia, she is going to work with her rheumatologist, consider regular exercise consider physical therapy and continue her current medications as prescribed by him.  She is advised that the main risk factors for stroke and vascular disease are high blood pressure cholesterol smoking and diabetes and poor diet, and to control these with her PCP do not smoke, and there is a warning signs of stroke as far as be fast as balance, eyes, face, arms, sensory in time.  For the memory issues she is advised the best treatment to stay physically mentally active, try to exercise mildly about 30 minutes a day, and take her vitamins and vitamin D on a  regular basis.  She is to follow-up with her rheumatologist and her internist for her medical problems and rheumatologic problems, and if her back gets worse she is to follow-up with Dr. Darnell Level or an orthopedic surgeon in the future.    Signed By: Shaune Pollack, MD     December 08, 2019       CC: Danella Penton, MD  FAX: [phone]

## 2019-12-08 NOTE — Progress Notes (Signed)
Chief Complaint   Patient presents with   ??? New Patient     c/o shaking, stated when she wakes up in the morning her upper and lower extreities shake, been happening for a long time   ??? Memory Loss     stated having difficulites getting words out       Concerns that her medication may be causing this.   Stated when she walks in stores she has to have a cart.  Stated she can't stand for only a short period of time due to her legs getting so shaky.

## 2019-12-09 LAB — HEMOGLOBIN A1C WITH EAG
Est. average glucose: 94 mg/dL
Hemoglobin A1c: 4.9 % (ref 4.0–5.6)

## 2019-12-09 LAB — CBC WITH AUTOMATED DIFF
ABS. BASOPHILS: 0 10*3/uL (ref 0.0–0.1)
ABS. EOSINOPHILS: 0.1 10*3/uL (ref 0.0–0.4)
ABS. IMM. GRANS.: 0 10*3/uL (ref 0.00–0.04)
ABS. LYMPHOCYTES: 1.4 10*3/uL (ref 0.8–3.5)
ABS. MONOCYTES: 0.3 10*3/uL (ref 0.0–1.0)
ABS. NEUTROPHILS: 2.1 10*3/uL (ref 1.8–8.0)
ABSOLUTE NRBC: 0 10*3/uL (ref 0.00–0.01)
BASOPHILS: 1 % (ref 0–1)
EOSINOPHILS: 3 % (ref 0–7)
HCT: 40.2 % (ref 35.0–47.0)
HGB: 13.3 g/dL (ref 11.5–16.0)
IMMATURE GRANULOCYTES: 0 % (ref 0.0–0.5)
LYMPHOCYTES: 35 % (ref 12–49)
MCH: 33.8 PG (ref 26.0–34.0)
MCHC: 33.1 g/dL (ref 30.0–36.5)
MCV: 102 FL — ABNORMAL HIGH (ref 80.0–99.0)
MONOCYTES: 8 % (ref 5–13)
MPV: 9.1 FL (ref 8.9–12.9)
NEUTROPHILS: 53 % (ref 32–75)
NRBC: 0 PER 100 WBC
PLATELET: 285 10*3/uL (ref 150–400)
RBC: 3.94 M/uL (ref 3.80–5.20)
RDW: 13.2 % (ref 11.5–14.5)
WBC: 4 10*3/uL (ref 3.6–11.0)

## 2019-12-09 LAB — METABOLIC PANEL, COMPREHENSIVE
A-G Ratio: 1.3 (ref 1.1–2.2)
ALT (SGPT): 29 U/L (ref 12–78)
AST (SGOT): 46 U/L — ABNORMAL HIGH (ref 15–37)
Albumin: 4.1 g/dL (ref 3.5–5.0)
Alk. phosphatase: 93 U/L (ref 45–117)
Anion gap: 4 mmol/L — ABNORMAL LOW (ref 5–15)
BUN/Creatinine ratio: 15 (ref 12–20)
BUN: 10 MG/DL (ref 6–20)
Bilirubin, total: 0.3 MG/DL (ref 0.2–1.0)
CO2: 30 mmol/L (ref 21–32)
Calcium: 8.9 MG/DL (ref 8.5–10.1)
Chloride: 104 mmol/L (ref 97–108)
Creatinine: 0.65 MG/DL (ref 0.55–1.02)
GFR est AA: >60 mL/min/{1.73_m2} (ref >60)
GFR est non-AA: >60 mL/min/{1.73_m2} (ref >60)
Globulin: 3.2 g/dL (ref 2.0–4.0)
Glucose: 90 mg/dL (ref 65–100)
Potassium: 4.1 mmol/L (ref 3.5–5.1)
Protein, total: 7.3 g/dL (ref 6.4–8.2)
Sodium: 138 mmol/L (ref 136–145)

## 2019-12-09 LAB — VITAMIN B12 & FOLATE
Folate: 28.2 ng/mL — ABNORMAL HIGH (ref 5.0–21.0)
Folate: 28.2 ng/mL — ABNORMAL HIGH (ref 5.0–21.0)
Vitamin B-12: 439 pg/mL (ref 193–986)
Vitamin B12: 439 pg/mL (ref 193–986)

## 2019-12-09 LAB — VITAMIN D, 25 HYDROXY: Vitamin D 25-Hydroxy: 64.8 ng/mL (ref 30–100)

## 2019-12-09 LAB — CK
CK: 55 U/L (ref 26–192)
Total CK: 55 U/L (ref 26–192)

## 2019-12-09 LAB — SED RATE (ESR): Sed rate, automated: 16 mm/hr (ref 0–30)

## 2019-12-09 LAB — CBC WITH AUTO DIFFERENTIAL
Basophils %: 1 % (ref 0–1)
Basophils Absolute: 0 10*3/uL (ref 0.0–0.1)
Eosinophils %: 3 % (ref 0–7)
Eosinophils Absolute: 0.1 10*3/uL (ref 0.0–0.4)
Granulocyte Absolute Count: 0 10*3/uL (ref 0.00–0.04)
Hematocrit: 40.2 % (ref 35.0–47.0)
Hemoglobin: 13.3 g/dL (ref 11.5–16.0)
Immature Granulocytes: 0 % (ref 0.0–0.5)
Lymphocytes %: 35 % (ref 12–49)
Lymphocytes Absolute: 1.4 10*3/uL (ref 0.8–3.5)
MCH: 33.8 PG (ref 26.0–34.0)
MCHC: 33.1 g/dL (ref 30.0–36.5)
MCV: 102 FL — ABNORMAL HIGH (ref 80.0–99.0)
MPV: 9.1 FL (ref 8.9–12.9)
Monocytes %: 8 % (ref 5–13)
Monocytes Absolute: 0.3 10*3/uL (ref 0.0–1.0)
NRBC Absolute: 0 10*3/uL (ref 0.00–0.01)
Neutrophils %: 53 % (ref 32–75)
Neutrophils Absolute: 2.1 10*3/uL (ref 1.8–8.0)
Nucleated RBCs: 0 PER 100 WBC
Platelets: 285 10*3/uL (ref 150–400)
RBC: 3.94 M/uL (ref 3.80–5.20)
RDW: 13.2 % (ref 11.5–14.5)
WBC: 4 10*3/uL (ref 3.6–11.0)

## 2019-12-09 LAB — COMPREHENSIVE METABOLIC PANEL
ALT: 29 U/L (ref 12–78)
AST: 46 U/L — ABNORMAL HIGH (ref 15–37)
Albumin/Globulin Ratio: 1.3 (ref 1.1–2.2)
Albumin: 4.1 g/dL (ref 3.5–5.0)
Alkaline Phosphatase: 93 U/L (ref 45–117)
Anion Gap: 4 mmol/L — ABNORMAL LOW (ref 5–15)
BUN: 10 MG/DL (ref 6–20)
Bun/Cre Ratio: 15 (ref 12–20)
CO2: 30 mmol/L (ref 21–32)
Calcium: 8.9 MG/DL (ref 8.5–10.1)
Chloride: 104 mmol/L (ref 97–108)
Creatinine: 0.65 MG/DL (ref 0.55–1.02)
EGFR IF NonAfrican American: >60 mL/min/{1.73_m2} (ref >60)
GFR African American: >60 mL/min/{1.73_m2} (ref >60)
Globulin: 3.2 g/dL (ref 2.0–4.0)
Glucose: 90 mg/dL (ref 65–100)
Potassium: 4.1 mmol/L (ref 3.5–5.1)
Sodium: 138 mmol/L (ref 136–145)
Total Bilirubin: 0.3 MG/DL (ref 0.2–1.0)
Total Protein: 7.3 g/dL (ref 6.4–8.2)

## 2019-12-09 LAB — VITAMIN D 25 HYDROXY: Vit D, 25-Hydroxy: 64.8 ng/mL (ref 30–100)

## 2019-12-09 LAB — HEMOGLOBIN A1C W/EAG
Hemoglobin A1C: 4.9 % (ref 4.0–5.6)
eAG: 94 mg/dL

## 2019-12-09 LAB — SEDIMENTATION RATE: Sed Rate: 16 mm/hr (ref 0–30)

## 2019-12-10 LAB — ANA COMPREHENSIVE PLUS PANEL
Anti Ribosomal P Ab: <0.2 AI (ref 0.0–0.9)
Anti-DNA (DS) Ab, QT: 1 IU/mL (ref 0–9)
Anti-DNA (DS) Ab, QT: 1 IU/mL (ref 0–9)
Anti-Jo-1: <0.2 AI (ref 0.0–0.9)
Anti-Jo-1: <0.2 AI (ref 0.0–0.9)
Antichromatin Ab: <0.2 AI (ref 0.0–0.9)
Antichromatin Antibodies: <0.2 AI (ref 0.0–0.9)
Antiribosomal P Antibodies: <0.2 AI (ref 0.0–0.9)
Antiscleroderma-70 Antibodies: <0.2 AI (ref 0.0–0.9)
Centromere B Ab: <0.2 AI (ref 0.0–0.9)
Centromere B Antibody: <0.2 AI (ref 0.0–0.9)
RNP ABS: <0.2 AI (ref 0.0–0.9)
RNP Abs: <0.2 AI (ref 0.0–0.9)
SMITH/RNP ANTIBODIES, 016376: <0.2 AI (ref 0.0–0.9)
Scleroderma-70 Ab: <0.2 AI (ref 0.0–0.9)
Sjogren's Anti-SS-A: 3.1 AI — ABNORMAL HIGH (ref 0.0–0.9)
Sjogren's Anti-SS-A: 3.1 AI — ABNORMAL HIGH (ref 0.0–0.9)
Sjogren's Anti-SS-B: 1 AI — ABNORMAL HIGH (ref 0.0–0.9)
Sjogren's Anti-SS-B: 1 AI — ABNORMAL HIGH (ref 0.0–0.9)
Smith Abs: <0.2 AI (ref 0.0–0.9)
Smith Abs: <0.2 AI (ref 0.0–0.9)
Smith/RNP Abs: <0.2 AI (ref 0.0–0.9)

## 2019-12-12 ENCOUNTER — Ambulatory Visit: Admit: 2019-12-12 | Payer: MEDICARE | Primary: Family Medicine

## 2019-12-12 ENCOUNTER — Ambulatory Visit

## 2019-12-12 DIAGNOSIS — R55 Syncope and collapse: Secondary | ICD-10-CM

## 2019-12-12 NOTE — Telephone Encounter (Signed)
Pt would like a call back with her doppler results.

## 2019-12-12 NOTE — Telephone Encounter (Signed)
Please let the patient know that Dr. Tamala Julian stated that her Doppler studies did not show any significant blockage which is a good time

## 2019-12-13 LAB — IMMUNOELECTROPHORESIS (IMMUNOFIX.)
Immunoglobulin A, Qt.: 220 mg/dL (ref 87–352)
Immunoglobulin A, Qt.: 220 mg/dL (ref 87–352)
Immunoglobulin G, Qt.: 1222 mg/dL (ref 586–1602)
Immunoglobulin G, Quantitative: 1222 mg/dL (ref 586–1602)
Immunoglobulin M, Qt.: 66 mg/dL (ref 26–217)
Immunoglobulin M, Quantitative: 66 mg/dL (ref 26–217)

## 2019-12-13 LAB — GM1 IGG AUTO-AB INTERPRETATION

## 2019-12-13 LAB — MYELIN ASSOC. GLYCOPROTEIN AB,IGM: Anti-MAG Ab, IgM: <900 BTU (ref 0–999)

## 2019-12-13 LAB — GM1 IGG AUTOANTIBODY: GM1 IgG Autoantibodies: <20 % (ref 0–30)

## 2019-12-16 NOTE — Telephone Encounter (Signed)
Notified patient of doppler results.

## 2019-12-19 NOTE — Telephone Encounter (Signed)
-----   Message from Terie Purser sent at 12/19/2019 12:00 PM EDT -----  Regarding: Dr. Ernest Mallick  Level 1/Escalated Issue      Caller's first and last name and relationship (if not the patient): Pt      Best contact number(s): [phone]      What are the symptoms: unsteady gait, dizziness, jerking and shakiness      Transfer successful - yes/no (include outcome):      Transfer declined - yes/no (include reason): No, during the lunch hour      Was caller advised to seek appropriate level of care - yes/no: Yes      Details to clarify the request: Pt said that Dr. Tamala Julian increased her Primidone and noticed that her shaking and jerking has become more frequent. She also said that her dizziness and off balance has increased as well. She doesn't know if it has to do with the increase of her medication, but would like to speak w/ someone on whether she needs to take a new medication. She said in the last few days it has gotten much worse. She said she has fallen twice in the last week.        Terie Purser

## 2019-12-19 NOTE — Telephone Encounter (Signed)
I called the patient, she is taking 50 mg of primidone in the morning and 100 mg at night think that is making her worse and she has had 2 falls, so I told her to stop the medication and will see if is doing anything to help her or just making her worse

## 2020-01-19 ENCOUNTER — Encounter

## 2020-01-24 ENCOUNTER — Ambulatory Visit: Payer: MEDICARE | Primary: Family Medicine

## 2020-01-27 ENCOUNTER — Ambulatory Visit: Payer: MEDICARE | Primary: Family Medicine

## 2020-02-09 ENCOUNTER — Inpatient Hospital Stay: Admit: 2020-02-09 | Payer: MEDICARE | Attending: Neurology | Primary: Family Medicine

## 2020-02-09 ENCOUNTER — Inpatient Hospital Stay: Admit: 2020-02-09 | Payer: MEDICARE | Attending: Nurse Practitioner | Primary: Family Medicine

## 2020-02-09 ENCOUNTER — Encounter

## 2020-02-09 DIAGNOSIS — R55 Syncope and collapse: Secondary | ICD-10-CM

## 2020-02-09 DIAGNOSIS — G608 Other hereditary and idiopathic neuropathies: Secondary | ICD-10-CM

## 2020-02-09 DIAGNOSIS — G250 Essential tremor: Secondary | ICD-10-CM

## 2020-02-09 MED ORDER — GADOTERIDOL 279.3 MG/ML INTRAVENOUS SOLUTION
279.3 mg/mL | Freq: Once | INTRAVENOUS | Status: AC
Start: 2020-02-09 — End: 2020-02-09
  Administered 2020-02-09: 20:00:00 via INTRAVENOUS

## 2020-02-09 NOTE — Progress Notes (Signed)
Patient advised that she has moderate spinal stenosis in the cervical and lumbar spine, but unremarkable brain MRI, we will send her to physical therapy for gait training and strengthening to see if we can get her stronger and more balanced and relieve her neck and back pain.  Patient also says she is worse now with her tremors off the Mysoline which was deferred and she told us a week ago when we stopped it, we told her to go back to 1 a day and see how she does.

## 2020-02-10 MED ORDER — PRIMIDONE 50 MG TAB
50 mg | ORAL_TABLET | Freq: Every evening | ORAL | 3 refills | Status: DC
Start: 2020-02-10 — End: 2021-04-08

## 2020-02-22 ENCOUNTER — Ambulatory Visit: Payer: MEDICARE | Primary: Family Medicine

## 2020-02-29 ENCOUNTER — Ambulatory Visit: Payer: MEDICARE | Primary: Family Medicine

## 2020-03-06 ENCOUNTER — Encounter: Attending: Neurology | Primary: Family Medicine

## 2020-03-24 ENCOUNTER — Inpatient Hospital Stay
Admit: 2020-03-24 | Discharge: 2020-03-26 | Disposition: A | Discharge status: Home / Self Care | Payer: MEDICARE | Attending: Internal Medicine | Admitting: Internal Medicine

## 2020-03-24 ENCOUNTER — Emergency Department: Admit: 2020-03-24 | Payer: MEDICARE | Primary: Family Medicine

## 2020-03-24 DIAGNOSIS — A419 Sepsis, unspecified organism: Secondary | ICD-10-CM

## 2020-03-24 LAB — METABOLIC PANEL, COMPREHENSIVE
A-G Ratio: 0.7 — ABNORMAL LOW (ref 1.1–2.2)
ALT (SGPT): 26 U/L (ref 12–78)
AST (SGOT): 44 U/L — ABNORMAL HIGH (ref 15–37)
Albumin: 3.2 g/dL — ABNORMAL LOW (ref 3.5–5.0)
Alk. phosphatase: 83 U/L (ref 45–117)
Anion gap: 3 mmol/L — ABNORMAL LOW (ref 5–15)
BUN/Creatinine ratio: 14 (ref 12–20)
BUN: 10 MG/DL (ref 6–20)
Bilirubin, total: 0.3 MG/DL (ref 0.2–1.0)
CO2: 31 mmol/L (ref 21–32)
Calcium: 8.9 MG/DL (ref 8.5–10.1)
Chloride: 101 mmol/L (ref 97–108)
Creatinine: 0.72 MG/DL (ref 0.55–1.02)
GFR est AA: >60 mL/min/{1.73_m2} (ref >60)
GFR est non-AA: >60 mL/min/{1.73_m2} (ref >60)
Globulin: 4.4 g/dL — ABNORMAL HIGH (ref 2.0–4.0)
Glucose: 110 mg/dL — ABNORMAL HIGH (ref 65–100)
Potassium: 3.3 mmol/L — ABNORMAL LOW (ref 3.5–5.1)
Protein, total: 7.6 g/dL (ref 6.4–8.2)
Sodium: 135 mmol/L — ABNORMAL LOW (ref 136–145)

## 2020-03-24 LAB — CBC WITH AUTOMATED DIFF
ABS. BASOPHILS: 0 10*3/uL (ref 0.0–0.1)
ABS. EOSINOPHILS: 0.2 10*3/uL (ref 0.0–0.4)
ABS. IMM. GRANS.: 0 10*3/uL (ref 0.00–0.04)
ABS. LYMPHOCYTES: 0.8 10*3/uL (ref 0.8–3.5)
ABS. MONOCYTES: 0.5 10*3/uL (ref 0.0–1.0)
ABS. NEUTROPHILS: 7 10*3/uL (ref 1.8–8.0)
ABSOLUTE NRBC: 0 10*3/uL (ref 0.00–0.01)
BASOPHILS: 0 % (ref 0–1)
EOSINOPHILS: 2 % (ref 0–7)
HCT: 36.4 % (ref 35.0–47.0)
HGB: 12.4 g/dL (ref 11.5–16.0)
IMMATURE GRANULOCYTES: 0 % (ref 0.0–0.5)
LYMPHOCYTES: 9 % — ABNORMAL LOW (ref 12–49)
MCH: 32.8 PG (ref 26.0–34.0)
MCHC: 34.1 g/dL (ref 30.0–36.5)
MCV: 96.3 FL (ref 80.0–99.0)
MONOCYTES: 6 % (ref 5–13)
MPV: 9.2 FL (ref 8.9–12.9)
NEUTROPHILS: 83 % — ABNORMAL HIGH (ref 32–75)
NRBC: 0 PER 100 WBC
PLATELET: 236 10*3/uL (ref 150–400)
RBC: 3.78 M/uL — ABNORMAL LOW (ref 3.80–5.20)
RDW: 12.7 % (ref 11.5–14.5)
WBC: 8.5 10*3/uL (ref 3.6–11.0)

## 2020-03-24 LAB — POC LACTIC ACID: Lactic Acid (POC): 0.72 mmol/L (ref 0.40–2.00)

## 2020-03-24 LAB — COVID-19 RAPID TEST: COVID-19 rapid test: NOT DETECTED

## 2020-03-24 LAB — CBC WITH AUTO DIFFERENTIAL
Basophils %: 0 % (ref 0–1)
Basophils Absolute: 0 10*3/uL (ref 0.0–0.1)
Eosinophils %: 2 % (ref 0–7)
Eosinophils Absolute: 0.2 10*3/uL (ref 0.0–0.4)
Granulocyte Absolute Count: 0 10*3/uL (ref 0.00–0.04)
Hematocrit: 36.4 % (ref 35.0–47.0)
Hemoglobin: 12.4 g/dL (ref 11.5–16.0)
Immature Granulocytes: 0 % (ref 0.0–0.5)
Lymphocytes %: 9 % — ABNORMAL LOW (ref 12–49)
Lymphocytes Absolute: 0.8 10*3/uL (ref 0.8–3.5)
MCH: 32.8 PG (ref 26.0–34.0)
MCHC: 34.1 g/dL (ref 30.0–36.5)
MCV: 96.3 FL (ref 80.0–99.0)
MPV: 9.2 FL (ref 8.9–12.9)
Monocytes %: 6 % (ref 5–13)
Monocytes Absolute: 0.5 10*3/uL (ref 0.0–1.0)
NRBC Absolute: 0 10*3/uL (ref 0.00–0.01)
Neutrophils %: 83 % — ABNORMAL HIGH (ref 32–75)
Neutrophils Absolute: 7 10*3/uL (ref 1.8–8.0)
Nucleated RBCs: 0 PER 100 WBC
Platelets: 236 10*3/uL (ref 150–400)
RBC: 3.78 M/uL — ABNORMAL LOW (ref 3.80–5.20)
RDW: 12.7 % (ref 11.5–14.5)
WBC: 8.5 10*3/uL (ref 3.6–11.0)

## 2020-03-24 LAB — COMPREHENSIVE METABOLIC PANEL
ALT: 26 U/L (ref 12–78)
AST: 44 U/L — ABNORMAL HIGH (ref 15–37)
Albumin/Globulin Ratio: 0.7 — ABNORMAL LOW (ref 1.1–2.2)
Albumin: 3.2 g/dL — ABNORMAL LOW (ref 3.5–5.0)
Alkaline Phosphatase: 83 U/L (ref 45–117)
Anion Gap: 3 mmol/L — ABNORMAL LOW (ref 5–15)
BUN: 10 MG/DL (ref 6–20)
Bun/Cre Ratio: 14 (ref 12–20)
CO2: 31 mmol/L (ref 21–32)
Calcium: 8.9 MG/DL (ref 8.5–10.1)
Chloride: 101 mmol/L (ref 97–108)
Creatinine: 0.72 MG/DL (ref 0.55–1.02)
EGFR IF NonAfrican American: >60 mL/min/{1.73_m2} (ref >60)
GFR African American: >60 mL/min/{1.73_m2} (ref >60)
Globulin: 4.4 g/dL — ABNORMAL HIGH (ref 2.0–4.0)
Glucose: 110 mg/dL — ABNORMAL HIGH (ref 65–100)
Potassium: 3.3 mmol/L — ABNORMAL LOW (ref 3.5–5.1)
Sodium: 135 mmol/L — ABNORMAL LOW (ref 136–145)
Total Bilirubin: 0.3 MG/DL (ref 0.2–1.0)
Total Protein: 7.6 g/dL (ref 6.4–8.2)

## 2020-03-24 LAB — COVID-19, RAPID: SARS-CoV-2, Rapid: NOT DETECTED

## 2020-03-24 LAB — POCT LACTIC ACID: POC Lactic Acid: 0.72 mmol/L (ref 0.40–2.00)

## 2020-03-24 MED ORDER — SODIUM CHLORIDE 0.9 % IV
500 mg | INTRAVENOUS | Status: AC
Start: 2020-03-24 — End: 2020-03-24
  Administered 2020-03-24: 22:00:00 via INTRAVENOUS

## 2020-03-24 MED ORDER — ACETAMINOPHEN 325 MG TABLET
325 mg | ORAL | Status: AC
Start: 2020-03-24 — End: 2020-03-24
  Administered 2020-03-24: 22:00:00 via ORAL

## 2020-03-24 MED ORDER — ACETAMINOPHEN 325 MG TABLET
325 mg | Freq: Four times a day (QID) | ORAL | Status: DC | PRN
Start: 2020-03-24 — End: 2020-03-26
  Administered 2020-03-25: 02:00:00 via ORAL

## 2020-03-24 MED ORDER — CEFTRIAXONE 1 GRAM SOLUTION FOR INJECTION
1 gram | INTRAMUSCULAR | Status: AC
Start: 2020-03-24 — End: 2020-03-24
  Administered 2020-03-24: 23:00:00 via INTRAVENOUS

## 2020-03-24 MED FILL — CEFTRIAXONE 1 GRAM SOLUTION FOR INJECTION: 1 gram | INTRAMUSCULAR | Qty: 1

## 2020-03-24 MED FILL — MAPAP (ACETAMINOPHEN) 325 MG TABLET: 325 mg | ORAL | Qty: 2

## 2020-03-24 MED FILL — AZITHROMYCIN 500 MG IV SOLUTION: 500 mg | INTRAVENOUS | Qty: 5

## 2020-03-24 NOTE — Progress Notes (Signed)
TRANSFER - IN REPORT:    Verbal report received from [facility], RN (name) on Katherine [name]  being received from ED # 11(unit) for routine progression of care      Report consisted of patient's Situation, Background, Assessment and   Recommendations(SBAR).     Information from the following report(s) SBAR, Kardex, ED Summary, Intake/Output, MAR, Accordion, Recent Results, Med Rec Status, Cardiac Rhythm .., Alarm Parameters , Pre Procedure Checklist, Procedure Verification, Quality Measures and Dual Neuro Assessment was reviewed with the receiving nurse.    Opportunity for questions and clarification was provided.      Assessment completed upon patient's arrival to unit and care assumed.

## 2020-03-24 NOTE — Progress Notes (Signed)
Pt. requested for home medication HYDROcodone-acetaminophen (NORCO) 10-325 mg tablet, 2 tablets q6 PRN for chronic back pain.   RN notified NP Purveyor.

## 2020-03-24 NOTE — ED Notes (Signed)
 Bedside shift change report given to Daved, RN (Cabin crew) by Norleen, RN (offgoing nurse). Report included the following information SBAR, Kardex, ED Summary, [facility] and Recent Results.         10:07 PM: TRANSFER - OUT REPORT:    Verbal report given to Waree, RN(name) on [employer]  being transferred to IVCU(unit) for routine progression of care       Report consisted of patient's Situation, Background, Assessment and   Recommendations(SBAR).     Information from the following report(s) SBAR, Kardex, ED Summary, [facility] and Recent Results was reviewed with the receiving nurse.    Lines:   Peripheral IV 03/24/20 Anterior;Proximal;Right Forearm (Active)        Opportunity for questions and clarification was provided.      Patient transported with: tech

## 2020-03-24 NOTE — Progress Notes (Signed)
Pt. Is unable to provide medication list at this time.  She will request family to bring medication list to day shift nurse tomorrow morning.

## 2020-03-24 NOTE — Progress Notes (Signed)
Pt. Arrived to [facility] from ED with a stretcher.  Pt. Reported moderate SOB relieved with 2L NC.  Pt. Denied CP, dizziness and palpitation.  Vital signs are stable.

## 2020-03-24 NOTE — H&P (Signed)
H&P by Hillard Danker, MD at 03/24/20 2013                Author: Hillard Danker, MD  Service: Internal Medicine  Author Type: Physician       Filed: 03/24/20 [id number]  Date of Service: 03/24/20 2013  Status: Signed          Editor: Hillard Danker, MD (Physician)                                  Hospitalist Admission Note      NAME: Katherine [name]    DOB:  Feb 22, 1956    MRN:  [id number]       Date/Time:   03/24/2020 8:13 PM      Patient PCP: Danella Penton, MD   ______________________________________________________________________   Given the patient's current clinical presentation, I have a high level of concern for decompensation if discharged from the emergency department.  Complex decision making was performed, which includes reviewing the patient's available past medical records,  laboratory results, and x-ray films.         My assessment of this patient's clinical condition and my plan of care is as follows.      Assessment / Plan:     Sepsis secondary to pneumonia failed outpatient antibiotic therapy   -Admit patient to telemetry   -Start patient on broad-spectrum IV antibiotic Rocephin and azithromycin   -Follow-up blood culture      Depression   -Continue home medication      Hypokalemia    -Replace   -Follow-up repeat      History of lupus   -Continue home medication      Fibromyalgia   -Continue home medication      History of rheumatoid arthritis   -Continue home medication         Code Status: Full    Surrogate Decision Maker:      DVT Prophylaxis: Lovenox    GI Prophylaxis: not indicated                 Subjective:     CHIEF COMPLAINT: SOB       HISTORY OF PRESENT ILLNESS:      64 years old female with past medical history significant for lupus, fibromyalgia, arthritis, depression, history of pneumonia, rheumatoid arthritis presented  to the hospital for evaluation of shortness of breath associated with cough and fever started about 6 days ago, patient 2 days ago went to the urgent care and  was diagnosed with pneumonia and was prescribed doxycycline, patient symptoms did not improve,  patient was tested for Covid came back negative, chest x-ray was done and show right lower lobe pneumonia, blood work show no significant acute abnormality.      We were asked to admit for work up and evaluation of the above problems.         Past Medical History:        Diagnosis  Date         ?  Adverse effect of anesthesia            hard time breathing after surgery, sister hard to wake up         ?  Arthritis       ?  Burning with urination       ?  Depression       ?  Diarrhea       ?  Diverticulosis       ?  Dizziness       ?  Easy bruising       ?  Fibromyalgia            chronic pain         ?  Headache(784.0)       ?  Hearing loss       ?  Ill-defined condition            past hx of shingles--eye/ear 13 years ago         ?  Lupus ([place])       ?  Meniere disease       ?  Night sweats       ?  Numbness            feet         ?  Rheumatoid arthritis, unspecified ([place])       ?  Sleep apnea            did not tolerate cpap         ?  Sweats, menopausal           ?  Visual loss                Past Surgical History:         Procedure  Laterality  Date          ?  COLONOSCOPY  N/A  09/15/2018          COLONOSCOPY performed by Christin Bach, MD at [facility]          ?  HX APPENDECTOMY         ?  HX BREAST BIOPSY  Left            benign core bx          ?  HX CARPAL TUNNEL RELEASE  Right  01/2014          with trigger finger          ?  HX CHOLECYSTECTOMY         ?  HX COLONOSCOPY         ?  HX CORNEAL TRANSPLANT  Right       ?  HX ENDOSCOPY              with dilatation          ?  HX GYN              hysterectomy          ?  HX HEENT              two ear surgeries to try to fix meniere's disease, second one they used gentimycin, ruined her balance always dizzy          ?  HX HERNIA REPAIR              left, incisional after first bladder repair          ?  HX KNEE ARTHROSCOPY  Right       ?  HX KNEE REPLACEMENT   Right       ?  HX [place]          back x2, no hardware          ?  HX TONSILLECTOMY         ?  HX TOTAL COLECTOMY    1994          1 foot of colon removed          ?  HX UROLOGICAL              bladder surgery x2             Social History          Tobacco Use         ?  Smoking status:  Former Smoker              Packs/day:  0.50         Years:  30.00         Pack years:  15.00         ?  Smokeless tobacco:  Never Used       Substance Use Topics         ?  Alcohol use:  Yes             Comment: rarely              Family History         Problem  Relation  Age of Onset          ?  Heart Disease  Mother       ?  Cancer  Mother                breast cancer          ?  Breast Cancer  Mother  71     ?  Hypertension  Father       ?  Diabetes  Father            ?  Heart Disease  Sister            Allergies        Allergen  Reactions         ?  Morphine  Other (comments)             "it does not stop the pain"         ?  Pcn [Penicillins]  Rash and Swelling              Prior to Admission medications             Medication  Sig  Start Date  End Date  Taking?  Authorizing Provider            benzonatate (TESSALON) 200 mg capsule  Take 200 mg by mouth three (3) times daily as needed for Cough.      Yes  Other, Phys, MD     doxycycline (MONODOX) 100 mg capsule  Take 100 mg by mouth two (2) times a day.      Yes  Other, Phys, MD     primidone (Mysoline) 50 mg tablet  Take 1 Tablet by mouth nightly.  02/09/20      Shaune Pollack, MD     albuterol (PROVENTIL HFA, VENTOLIN HFA, PROAIR HFA) 90 mcg/actuation inhaler  INHALE 1 TO 2 PUFFS BY MOUTH EVERY 4 TO 6 HOURS AS NEEDED  09/13/19      Provider, Historical     cyclobenzaprine (FLEXERIL) 10 mg tablet  cyclobenzaprine 10 mg tablet        Provider, Historical     famotidine (PEPCID) 40 mg tablet  TAKE 1 TABLET BY MOUTH TWICE DAILY AS DIRECTED  09/15/19  Provider, Historical     folic acid (FOLVITE) 1 mg tablet  TAKE 1 TABLET BY MOUTH EVERY DAY  10/31/19       Provider, Historical     methotrexate (RHEUMATREX) 2.5 mg tablet    11/30/19      Provider, Historical     predniSONE (DELTASONE) 10 mg tablet    09/12/19      Provider, Historical     gabapentin (NEURONTIN) 300 mg capsule  Take 1 Cap by mouth two (2) times a day. Max Daily Amount: 600 mg.  12/08/19      Shaune Pollack, MD     dexAMETHasone (DECADRON) 4 mg tablet  Take 4 mg by mouth two (2) times daily (with meals).        Provider, Historical     tiZANidine (ZANAFLEX) 4 mg capsule  Take 4 mg by mouth three (3) times daily.        Provider, Historical     topiramate (TOPAMAX) 200 mg tablet  Take 200 mg by mouth two (2) times a day.        Provider, Historical     flaxseed oil (OMEGA 3 PO)  Take 1 Cap by mouth daily.        Provider, Historical     nortriptyline (PAMELOR) 50 mg capsule  Take 50 mg by mouth nightly.        Provider, Historical     prenatal vit 91/iron/folic/dha (PRENATAL + DHA PO)  Take 1 Tab by mouth daily.        Provider, Historical     POTASSIUM CHLORIDE PO  Take 10 mEq by mouth two (2) times a day.        Provider, Historical     linaclotide (LINZESS) 145 mcg cap capsule  Take 145 mcg by mouth Daily (before breakfast).        Provider, Historical     hydroxychloroquine (PLAQUENIL) 200 mg tablet  Take 200 mg by mouth two (2) times a day.        Other, Phys, MD     valacyclovir (VALTREX) 1 g tablet  Take 500 mg by mouth every morning.  08/28/09      Other, Phys, MD     duloxetine (CYMBALTA) 60 mg capsule  Take 60 mg by mouth two (2) times a day.  08/28/09      Other, Phys, MD     triamterene-hydrochlorothiazide (DYAZIDE) 37.5-25 mg per capsule  Take 1 Cap by mouth daily.        Other, Phys, MD     ergocalciferol (VITAMIN D) 50,000 unit capsule  Take 50,000 Units by mouth every seven (7) days.  08/28/09      Other, Phys, MD     azelastine (ASTELIN) 137 mcg nasal spray  1 Spray by Nasal route two (2) times a day. Administer to right and left nostril.        Other, Phys, MD            prednisoLONE acetate  (PRED FORTE) 1 % ophthalmic suspension  Administer 1 Drop to right eye every morning.  08/28/09      Other, Phys, MD           REVIEW OF SYSTEMS:      I am not able to complete the review of systems because:        The patient is intubated and sedated       The patient has altered mental status due  to his acute medical problems       The patient has baseline aphasia from prior stroke(s)       The patient has baseline dementia and is not reliable historian       The patient is in acute medical distress and unable to provide information                  Total of 12 systems reviewed as follows:        POSITIVE= underlined text  Negative = text not underlined   General:  fever, chills, sweats,  generalized weakness, weight loss/gain,       loss of appetite    Eyes:    blurred vision, eye pain, loss of vision, double vision   ENT:    rhinorrhea, pharyngitis    Respiratory:   cough, sputum production, SOB,  DOE, wheezing, pleuritic pain    Cardiology:   chest pain, palpitations, orthopnea, PND, edema, syncope    Gastrointestinal:  abdominal pain , N/V, diarrhea, dysphagia, constipation, bleeding    Genitourinary:  frequency, urgency, dysuria, hematuria, incontinence    Muskuloskeletal :  arthralgia, myalgia, back pain   Hematology:  easy bruising, nose or gum bleeding, lymphadenopathy    Dermatological: rash, ulceration, pruritis, color change / jaundice   Endocrine:   hot flashes or polydipsia    Neurological:  headache, dizziness, confusion, focal weakness, paresthesia,      Speech difficulties, memory loss, gait difficulty   Psychological: Feelings of anxiety, depression, agitation        Objective:     VITALS:     Visit Vitals      BP  134/62     Pulse  91     Temp  98.8 F (37.1 C)     Resp  20     Ht  '5\' 6"'$  (1.676 m)     Wt  99 kg (218 lb 4.1 oz)     SpO2  99%        BMI  35.23 kg/m           PHYSICAL EXAM:      General:    Alert, cooperative, no distress, appears stated age.      HEENT: Atraumatic, anicteric  sclerae, pink conjunctivae      No oral ulcers, mucosa moist, throat clear, dentition fair   Neck:  Supple, symmetrical,  thyroid: non tender   Lungs:   Clear to auscultation bilaterally.  No Wheezing or Rhonchi. No rales.   Chest wall:  No tenderness  No Accessory muscle use.   Heart:   Regular  rhythm,  No  murmur   No edema   Abdomen:   Soft, non-tender. Not distended.  Bowel sounds normal   Extremities: No cyanosis.  No clubbing,       Skin turgor normal, Capillary refill normal, Radial dial pulse 2+   Skin:     Not pale.  Not Jaundiced  No rashes    Psych:  Good insight.  Not depressed.  Not anxious or agitated.   Neurologic: EOMs intact. No facial asymmetry. No aphasia or slurred speech. Symmetrical strength, Sensation grossly intact. Alert and oriented X 4.       _______________________________________________________________________   Care Plan discussed with:           Comments         Patient  y           Family  RN  y       Environmental consultant:   y       _______________________________________________________________________   Expected  Disposition :       Home with Family  y        HH/PT/OT/RN       SNF/LTC       [facility]       ________________________________________________________________________   TOTAL TIME:  12  Minutes      Critical Care Provided      Minutes non procedure based              Comments           y  Reviewed previous records         >50% of visit spent in counseling and coordination of care  y  Discussion with patient and/or family and questions answered           ________________________________________________________________________   Signed: Breena Bevacqua Lisabeth Pick, MD      Procedures: see electronic medical records for all procedures/Xrays and details which were not copied into this note but were reviewed prior to creation of Plan.      LAB DATA REVIEWED:       Recent Results (from the past 24 hour(s))     CBC WITH AUTOMATED DIFF          Collection  Time: 03/24/20  5:50 PM         Result  Value  Ref Range            WBC  8.5  3.6 - 11.0 K/uL       RBC  3.78 (L)  3.80 - 5.20 M/uL       HGB  12.4  11.5 - 16.0 g/dL       HCT  36.4  35.0 - 47.0 %       MCV  96.3  80.0 - 99.0 FL       MCH  32.8  26.0 - 34.0 PG       MCHC  34.1  30.0 - 36.5 g/dL       RDW  12.7  11.5 - 14.5 %       PLATELET  236  150 - 400 K/uL       MPV  9.2  8.9 - 12.9 FL       NRBC  0.0  0 PER 100 WBC       ABSOLUTE NRBC  0.00  0.00 - 0.01 K/uL       NEUTROPHILS  83 (H)  32 - 75 %       LYMPHOCYTES  9 (L)  12 - 49 %       MONOCYTES  6  5 - 13 %       EOSINOPHILS  2  0 - 7 %       BASOPHILS  0  0 - 1 %       IMMATURE GRANULOCYTES  0  0.0 - 0.5 %       ABS. NEUTROPHILS  7.0  1.8 - 8.0 K/UL       ABS. LYMPHOCYTES  0.8  0.8 - 3.5 K/UL       ABS. MONOCYTES  0.5  0.0 - 1.0 K/UL       ABS. EOSINOPHILS  0.2  0.0 - 0.4 K/UL       ABS. BASOPHILS  0.0  0.0 - 0.1 K/UL       ABS. IMM. GRANS.  0.0  0.00 - 0.04 K/UL       DF  SMEAR SCANNED          RBC COMMENTS  NORMOCYTIC, NORMOCHROMIC          METABOLIC PANEL, COMPREHENSIVE          Collection Time: 03/24/20  5:50 PM         Result  Value  Ref Range            Sodium  135 (L)  136 - 145 mmol/L       Potassium  3.3 (L)  3.5 - 5.1 mmol/L       Chloride  101  97 - 108 mmol/L       CO2  31  21 - 32 mmol/L       Anion gap  3 (L)  5 - 15 mmol/L       Glucose  110 (H)  65 - 100 mg/dL       BUN  10  6 - 20 MG/DL       Creatinine  0.72  0.55 - 1.02 MG/DL       BUN/Creatinine ratio  14  12 - 20         GFR est AA  >60  >60 ml/min/1.28m       GFR est non-AA  >60  >60 ml/min/1.73m      Calcium  8.9  8.5 - 10.1 MG/DL       Bilirubin, total  0.3  0.2 - 1.0 MG/DL       ALT (SGPT)  26  12 - 78 U/L       AST (SGOT)  44 (H)  15 - 37 U/L            Alk. phosphatase  83  45 - 117 U/L            Protein, total  7.6  6.4 - 8.2 g/dL       Albumin  3.2 (L)  3.5 - 5.0 g/dL       Globulin  4.4 (H)  2.0 - 4.0 g/dL       A-G Ratio  0.7 (L)  1.1 - 2.2         COVID-19 RAPID TEST           Collection Time: 03/24/20  5:50 PM         Result  Value  Ref Range            Specimen source  Nasopharyngeal          COVID-19 rapid test  Not detected  NOTD         POC LACTIC ACID          Collection Time: 03/24/20  6:00 PM         Result  Value  Ref Range            Lactic Acid (POC)  0.72  0.40 - 2.00 mmol/L

## 2020-03-24 NOTE — ED Provider Notes (Signed)
ED Provider Notes by Vernell Morgans, MD at 03/24/20 1849                Author: Vernell Morgans, MD  Service: EMERGENCY  Author Type: Physician       Filed: 03/24/20 1953  Date of Service: 03/24/20 1849  Status: Signed          Editor: Vernell Morgans, MD (Physician)               EMERGENCY DEPARTMENT HISTORY AND PHYSICAL EXAM           Date: 03/24/2020   Patient Name: Katherine [name]   Patient Age and Sex: 64 y.o.  female         History of Presenting Illness          Chief Complaint       Patient presents with        ?  Fever             Ambulatory into the ED with c/o high fever (102.6 at home - Tylenol at 4 pm), cough, chest congestion x 7/28 - seen at [facility] on 7/29 and was  diagnosed with Rt sided PNA (does not have imaging disk). No obvious distress.         ?  Cough           History Provided By: Patient      HPI: [name] is a  64 year old female with a history of pneumonia in the past presenting with fever and cough.  Patient states that since Monday, 6 days ago has been having a cough as well as some shortness of breath as well as fevers.  2 days ago went to [facility] and  was diagnosed with a right-sided pneumonia.  Has been having right-sided upper chest pain as well as right-sided back pain.  Patient states that she started taking doxycycline that night as well as benzoate capsules but continues to have cough, shortness  of breath as well as fevers.  Had a fever at home today at around 4 PM that was 102.6.  Took Tylenol.  Patient states that at [facility] 2 days ago was tested for COVID-19 and it was negative.  Has also been fully vaccinated.      There are no other complaints, changes, or physical findings at this time.      PCP: Danella Penton, MD        No current facility-administered medications on file prior to encounter.          Current Outpatient Medications on File Prior to Encounter          Medication  Sig  Dispense  Refill           ?  benzonatate (TESSALON)  200 mg capsule  Take 200 mg by mouth three (3) times daily as needed for Cough.         ?  doxycycline (MONODOX) 100 mg capsule  Take 100 mg by mouth two (2) times a day.         ?  primidone (Mysoline) 50 mg tablet  Take 1 Tablet by mouth nightly.  90 Tablet  3           ?  albuterol (PROVENTIL HFA, VENTOLIN HFA, PROAIR HFA) 90 mcg/actuation inhaler  INHALE 1 TO 2 PUFFS BY MOUTH EVERY 4 TO 6 HOURS AS NEEDED               ?  cyclobenzaprine (FLEXERIL) 10 mg tablet  cyclobenzaprine 10 mg tablet         ?  famotidine (PEPCID) 40 mg tablet  TAKE 1 TABLET BY MOUTH TWICE DAILY AS DIRECTED         ?  folic acid (FOLVITE) 1 mg tablet  TAKE 1 TABLET BY MOUTH EVERY DAY         ?  methotrexate (RHEUMATREX) 2.5 mg tablet           ?  predniSONE (DELTASONE) 10 mg tablet           ?  gabapentin (NEURONTIN) 300 mg capsule  Take 1 Cap by mouth two (2) times a day. Max Daily Amount: 600 mg.  180 Cap  1     ?  dexAMETHasone (DECADRON) 4 mg tablet  Take 4 mg by mouth two (2) times daily (with meals).         ?  tiZANidine (ZANAFLEX) 4 mg capsule  Take 4 mg by mouth three (3) times daily.         ?  topiramate (TOPAMAX) 200 mg tablet  Take 200 mg by mouth two (2) times a day.         ?  flaxseed oil (OMEGA 3 PO)  Take 1 Cap by mouth daily.         ?  nortriptyline (PAMELOR) 50 mg capsule  Take 50 mg by mouth nightly.         ?  prenatal vit 91/iron/folic/dha (PRENATAL + DHA PO)  Take 1 Tab by mouth daily.         ?  POTASSIUM CHLORIDE PO  Take 10 mEq by mouth two (2) times a day.         ?  linaclotide (LINZESS) 145 mcg cap capsule  Take 145 mcg by mouth Daily (before breakfast).         ?  hydroxychloroquine (PLAQUENIL) 200 mg tablet  Take 200 mg by mouth two (2) times a day.         ?  valacyclovir (VALTREX) 1 g tablet  Take 500 mg by mouth every morning.         ?  duloxetine (CYMBALTA) 60 mg capsule  Take 60 mg by mouth two (2) times a day.         ?  triamterene-hydrochlorothiazide (DYAZIDE) 37.5-25 mg per capsule  Take 1 Cap by  mouth daily.               ?  ergocalciferol (VITAMIN D) 50,000 unit capsule  Take 50,000 Units by mouth every seven (7) days.               ?  azelastine (ASTELIN) 137 mcg nasal spray  1 Spray by Nasal route two (2) times a day. Administer to right and left nostril.               ?  prednisoLONE acetate (PRED FORTE) 1 % ophthalmic suspension  Administer 1 Drop to right eye every morning.                 Past History        Past Medical History:     Past Medical History:        Diagnosis  Date         ?  Adverse effect of anesthesia            hard time breathing after surgery, sister hard  to wake up         ?  Arthritis       ?  Burning with urination       ?  Depression       ?  Diarrhea       ?  Diverticulosis       ?  Dizziness       ?  Easy bruising       ?  Fibromyalgia            chronic pain         ?  Headache(784.0)       ?  Hearing loss       ?  Ill-defined condition            past hx of shingles--eye/ear 13 years ago         ?  Lupus ([place])       ?  Meniere disease       ?  Night sweats       ?  Numbness            feet         ?  Rheumatoid arthritis, unspecified ([place])       ?  Sleep apnea            did not tolerate cpap         ?  Sweats, menopausal           ?  Visual loss             Past Surgical History:     Past Surgical History:         Procedure  Laterality  Date          ?  COLONOSCOPY  N/A  09/15/2018          COLONOSCOPY performed by Christin Bach, MD at [facility]          ?  HX APPENDECTOMY         ?  HX BREAST BIOPSY  Left            benign core bx          ?  HX CARPAL TUNNEL RELEASE  Right  01/2014          with trigger finger          ?  HX CHOLECYSTECTOMY         ?  HX COLONOSCOPY         ?  HX CORNEAL TRANSPLANT  Right       ?  HX ENDOSCOPY              with dilatation          ?  HX GYN              hysterectomy          ?  HX HEENT              two ear surgeries to try to fix meniere's disease, second one they used gentimycin, ruined her balance always dizzy          ?  HX  HERNIA REPAIR              left, incisional after first bladder repair          ?  HX KNEE ARTHROSCOPY  Right       ?  HX KNEE  REPLACEMENT  Right       ?  HX [place]          back x2, no hardware          ?  HX TONSILLECTOMY         ?  HX TOTAL COLECTOMY    1994          1 foot of colon removed          ?  HX UROLOGICAL              bladder surgery x2           Family History:     Family History         Problem  Relation  Age of Onset          ?  Heart Disease  Mother       ?  Cancer  Mother                breast cancer          ?  Breast Cancer  Mother  59     ?  Hypertension  Father       ?  Diabetes  Father            ?  Heart Disease  Sister             Social History:     Social History          Tobacco Use         ?  Smoking status:  Former Smoker              Packs/day:  0.50         Years:  30.00         Pack years:  15.00         ?  Smokeless tobacco:  Never Used       Vaping Use         ?  Vaping Use:  Never used       Substance Use Topics         ?  Alcohol use:  Yes             Comment: rarely         ?  Drug use:  Never           Allergies:     Allergies        Allergen  Reactions         ?  Morphine  Other (comments)             "it does not stop the pain"         ?  Pcn [Penicillins]  Rash and Swelling                Review of Systems     Review of Systems    Constitutional: Positive for fatigue and fever . Negative for chills.    Respiratory: Positive for cough and shortness of breath .     Cardiovascular: Positive for chest pain.    Gastrointestinal: Negative for abdominal pain, constipation, diarrhea, nausea and vomiting.    Genitourinary: Negative for dysuria, frequency and hematuria.    Musculoskeletal: Positive for back pain.    Neurological: Negative for weakness and numbness.    All other systems reviewed and are negative.  Physical Exam     Physical Exam   Vitals and nursing note reviewed.   Constitutional:        Appearance: She is well-developed.   HENT :        Head: Normocephalic and atraumatic.      Nose: Nose normal.      Mouth/Throat:      Mouth: Mucous membranes are moist.    Eyes:       Extraocular Movements: Extraocular movements intact.      Conjunctiva/sclera: Conjunctivae normal.   Cardiovascular:       Rate and Rhythm: Normal rate and regular rhythm.   Pulmonary :       Effort: Pulmonary effort is normal. No respiratory distress.      Breath sounds: Rales present.      Comments:  Patient has rales in the right side of her lungs.  Abdominal:      General: There is no distension.       Palpations: Abdomen is soft.      Tenderness: There is no abdominal tenderness.     Musculoskeletal:          General: Normal range of motion.      Cervical back: Normal range of motion and neck supple.    Skin:      General: Skin is warm and dry.   Neurological :       General: No focal deficit present.      Mental Status: She is alert and oriented to person, place, and time. Mental status is at baseline.   Psychiatric:         Mood and Affect: Mood normal.                Diagnostic Study Results        Labs -         Recent Results (from the past 12 hour(s))     CBC WITH AUTOMATED DIFF          Collection Time: 03/24/20  5:50 PM         Result  Value  Ref Range            WBC  8.5  3.6 - 11.0 K/uL       RBC  3.78 (L)  3.80 - 5.20 M/uL       HGB  12.4  11.5 - 16.0 g/dL       HCT  36.4  35.0 - 47.0 %       MCV  96.3  80.0 - 99.0 FL       MCH  32.8  26.0 - 34.0 PG       MCHC  34.1  30.0 - 36.5 g/dL       RDW  12.7  11.5 - 14.5 %       PLATELET  236  150 - 400 K/uL       MPV  9.2  8.9 - 12.9 FL       NRBC  0.0  0 PER 100 WBC       ABSOLUTE NRBC  0.00  0.00 - 0.01 K/uL       NEUTROPHILS  83 (H)  32 - 75 %       LYMPHOCYTES  9 (L)  12 - 49 %       MONOCYTES  6  5 - 13 %       EOSINOPHILS  2  0 - 7 %  BASOPHILS  0  0 - 1 %       IMMATURE GRANULOCYTES  0  0.0 - 0.5 %       ABS. NEUTROPHILS  7.0  1.8 - 8.0 K/UL       ABS. LYMPHOCYTES  0.8  0.8 - 3.5 K/UL       ABS. MONOCYTES  0.5  0.0  - 1.0 K/UL       ABS. EOSINOPHILS  0.2  0.0 - 0.4 K/UL       ABS. BASOPHILS  0.0  0.0 - 0.1 K/UL       ABS. IMM. GRANS.  0.0  0.00 - 0.04 K/UL       DF  SMEAR SCANNED          RBC COMMENTS  NORMOCYTIC, NORMOCHROMIC          METABOLIC PANEL, COMPREHENSIVE          Collection Time: 03/24/20  5:50 PM         Result  Value  Ref Range            Sodium  135 (L)  136 - 145 mmol/L            Potassium  3.3 (L)  3.5 - 5.1 mmol/L       Chloride  101  97 - 108 mmol/L       CO2  31  21 - 32 mmol/L       Anion gap  3 (L)  5 - 15 mmol/L       Glucose  110 (H)  65 - 100 mg/dL       BUN  10  6 - 20 MG/DL       Creatinine  0.72  0.55 - 1.02 MG/DL       BUN/Creatinine ratio  14  12 - 20         GFR est AA  >60  >60 ml/min/1.11m       GFR est non-AA  >60  >60 ml/min/1.720m      Calcium  8.9  8.5 - 10.1 MG/DL       Bilirubin, total  0.3  0.2 - 1.0 MG/DL       ALT (SGPT)  26  12 - 78 U/L       AST (SGOT)  44 (H)  15 - 37 U/L       Alk. phosphatase  83  45 - 117 U/L       Protein, total  7.6  6.4 - 8.2 g/dL       Albumin  3.2 (L)  3.5 - 5.0 g/dL       Globulin  4.4 (H)  2.0 - 4.0 g/dL       A-G Ratio  0.7 (L)  1.1 - 2.2         COVID-19 RAPID TEST          Collection Time: 03/24/20  5:50 PM         Result  Value  Ref Range            Specimen source  Nasopharyngeal          COVID-19 rapid test  Not detected  NOTD         POC LACTIC ACID          Collection Time: 03/24/20  6:00 PM         Result  Value  Ref Range  Lactic Acid (POC)  0.72  0.40 - 2.00 mmol/L           Radiologic Studies -      XR CHEST PORT       Final Result     RLL pneumonia.                      CT Results   (Last 48 hours)          None                 CXR Results   (Last 48 hours)                                    03/24/20 1810    XR CHEST PORT  Final result            Impression:    RLL pneumonia.                              Narrative:    EXAM: Portable CXR. 1753 hours.               INDICATION: Cough, fever             FINDINGS:      The lungs show  right lower lobe airspace disease compatible with pneumonia.      Heart is normal in size. There is no pulmonary edema. There is no evident      pneumothorax or pleural effusion.                                       Medical Decision Making     I am the first provider for this patient.      I reviewed the vital signs, available nursing notes, past medical history, past surgical history, family history and social history.      Vital Signs-Reviewed the patient's vital signs.   Patient Vitals for the past 12 hrs:            Temp  Pulse  Resp  BP  SpO2            03/24/20 1930  98.8 ??F (37.1 ??C)  91  20  134/62  99 %            03/24/20 1911  --  98  --  135/62  99 %     03/24/20 1850  --  --  --  --  100 %            03/24/20 1711  (!) 100.6 ??F (38.1 ??C)  (!) 102  18  (!) 146/72  100 %           Records Reviewed: Nursing Notes and Old Medical Records      Provider Notes (Medical Decision Making):    Patient presenting for fever, tachycardia and concerns for persistent pneumonia despite doxycycline.  Likely all related to the pneumonia that is causing this.  No urinary symptoms or abdominal symptoms  to be concerned for intra-abdominal pathology.  Will get chest x-ray, sepsis labs like white count as well as lactate, give her Tylenol to bring down the fever, and give her some IV antibiotics to kick start her system.  Might  need admission given persistent  fevers despite 48 hours of antibiotics.      ED Course:    Initial assessment performed. The patients presenting problems have been discussed, and they are in agreement with the care plan formulated and outlined with them.  I have encouraged them to ask questions as they arise throughout their visit.          Critical Care Time:    0      Disposition:      Admission Note:   Patient is being admitted to the hospital by Dr. Sheryle Hail, Service: Hospitalist.  The results of their tests and reasons for their admission have been discussed with them and available family. They  convey  agreement and understanding for the need to be admitted and for their admission diagnosis.            Diagnosis        Clinical Impression:       1.  Failure of outpatient treatment         2.  Pneumonia of right lower lobe due to infectious organism            Attestations:   Vernell Morgans, M.D.              Please note that this dictation was completed with Dragon, the computer voice recognition software.  Quite often unanticipated grammatical, syntax, homophones, and other interpretive errors are inadvertently  transcribed by the computer software.  Please disregard these errors.  Please excuse any errors that have escaped final proofreading.  Thank you.

## 2020-03-25 LAB — CBC W/O DIFF
ABSOLUTE NRBC: 0 10*3/uL (ref 0.00–0.01)
HCT: 38.8 % (ref 35.0–47.0)
HGB: 12.9 g/dL (ref 11.5–16.0)
MCH: 32.3 PG (ref 26.0–34.0)
MCHC: 33.2 g/dL (ref 30.0–36.5)
MCV: 97.2 FL (ref 80.0–99.0)
MPV: 8.9 FL (ref 8.9–12.9)
NRBC: 0 PER 100 WBC
PLATELET: 258 10*3/uL (ref 150–400)
RBC: 3.99 M/uL (ref 3.80–5.20)
RDW: 12.6 % (ref 11.5–14.5)
WBC: 7.1 10*3/uL (ref 3.6–11.0)

## 2020-03-25 LAB — METABOLIC PANEL, BASIC
Anion gap: 6 mmol/L (ref 5–15)
BUN/Creatinine ratio: 16 (ref 12–20)
BUN: 10 MG/DL (ref 6–20)
CO2: 27 mmol/L (ref 21–32)
Calcium: 9.7 MG/DL (ref 8.5–10.1)
Chloride: 103 mmol/L (ref 97–108)
Creatinine: 0.63 MG/DL (ref 0.55–1.02)
GFR est AA: >60 mL/min/{1.73_m2} (ref >60)
GFR est non-AA: >60 mL/min/{1.73_m2} (ref >60)
Glucose: 91 mg/dL (ref 65–100)
Potassium: 3.3 mmol/L — ABNORMAL LOW (ref 3.5–5.1)
Sodium: 136 mmol/L (ref 136–145)

## 2020-03-25 LAB — CBC
Hematocrit: 38.8 % (ref 35.0–47.0)
Hemoglobin: 12.9 g/dL (ref 11.5–16.0)
MCH: 32.3 PG (ref 26.0–34.0)
MCHC: 33.2 g/dL (ref 30.0–36.5)
MCV: 97.2 FL (ref 80.0–99.0)
MPV: 8.9 FL (ref 8.9–12.9)
NRBC Absolute: 0 10*3/uL (ref 0.00–0.01)
Nucleated RBCs: 0 PER 100 WBC
Platelets: 258 10*3/uL (ref 150–400)
RBC: 3.99 M/uL (ref 3.80–5.20)
RDW: 12.6 % (ref 11.5–14.5)
WBC: 7.1 10*3/uL (ref 3.6–11.0)

## 2020-03-25 LAB — BASIC METABOLIC PANEL
Anion Gap: 6 mmol/L (ref 5–15)
BUN: 10 MG/DL (ref 6–20)
Bun/Cre Ratio: 16 (ref 12–20)
CO2: 27 mmol/L (ref 21–32)
Calcium: 9.7 MG/DL (ref 8.5–10.1)
Chloride: 103 mmol/L (ref 97–108)
Creatinine: 0.63 MG/DL (ref 0.55–1.02)
EGFR IF NonAfrican American: >60 mL/min/{1.73_m2} (ref >60)
GFR African American: >60 mL/min/{1.73_m2} (ref >60)
Glucose: 91 mg/dL (ref 65–100)
Potassium: 3.3 mmol/L — ABNORMAL LOW (ref 3.5–5.1)
Sodium: 136 mmol/L (ref 136–145)

## 2020-03-25 MED ORDER — TRIAMTERENE-HYDROCHLOROTHIAZIDE 37.5 MG-25 MG TAB
Freq: Every day | ORAL | Status: DC
Start: 2020-03-25 — End: 2020-03-26
  Administered 2020-03-25 – 2020-03-26 (×2): via ORAL

## 2020-03-25 MED ORDER — LINACLOTIDE 145 MCG CAPSULE
145 mcg | Freq: Every day | ORAL | Status: DC
Start: 2020-03-25 — End: 2020-03-26
  Administered 2020-03-26: 12:00:00 via ORAL

## 2020-03-25 MED ORDER — DEXAMETHASONE 4 MG TAB
4 mg | Freq: Two times a day (BID) | ORAL | Status: DC
Start: 2020-03-25 — End: 2020-03-26
  Administered 2020-03-25 – 2020-03-26 (×3): via ORAL

## 2020-03-25 MED ORDER — SODIUM CHLORIDE 0.9 % IJ SYRG
INTRAMUSCULAR | Status: DC | PRN
Start: 2020-03-25 — End: 2020-03-26

## 2020-03-25 MED ORDER — POTASSIUM CHLORIDE SR 10 MEQ TAB
10 mEq | ORAL | Status: AC
Start: 2020-03-25 — End: 2020-03-24
  Administered 2020-03-25: 04:00:00 via ORAL

## 2020-03-25 MED ORDER — HYDROCODONE-ACETAMINOPHEN 10 MG-325 MG TAB
10-325 mg | Freq: Four times a day (QID) | ORAL | Status: DC | PRN
Start: 2020-03-25 — End: 2020-03-26
  Administered 2020-03-25 – 2020-03-26 (×4): via ORAL

## 2020-03-25 MED ORDER — HYDROXYCHLOROQUINE 200 MG TAB
200 mg | Freq: Two times a day (BID) | ORAL | Status: DC
Start: 2020-03-25 — End: 2020-03-26
  Administered 2020-03-25 – 2020-03-26 (×3): via ORAL

## 2020-03-25 MED ORDER — DULOXETINE 30 MG CAP, DELAYED RELEASE
30 mg | Freq: Two times a day (BID) | ORAL | Status: DC
Start: 2020-03-25 — End: 2020-03-26
  Administered 2020-03-25 – 2020-03-26 (×3): via ORAL

## 2020-03-25 MED ORDER — GABAPENTIN 300 MG CAP
300 mg | Freq: Two times a day (BID) | ORAL | Status: DC
Start: 2020-03-25 — End: 2020-03-26
  Administered 2020-03-26: 12:00:00 via ORAL

## 2020-03-25 MED ORDER — FAMOTIDINE 20 MG TAB
20 mg | Freq: Two times a day (BID) | ORAL | Status: DC
Start: 2020-03-25 — End: 2020-03-26
  Administered 2020-03-25 – 2020-03-26 (×3): via ORAL

## 2020-03-25 MED ORDER — POTASSIUM CHLORIDE SR 10 MEQ TAB
10 mEq | Freq: Two times a day (BID) | ORAL | Status: DC
Start: 2020-03-25 — End: 2020-03-26
  Administered 2020-03-25 – 2020-03-26 (×3): via ORAL

## 2020-03-25 MED ORDER — ALBUTEROL SULFATE 0.083 % (0.83 MG/ML) SOLN FOR INHALATION
2.5 mg /3 mL (0.083 %) | RESPIRATORY_TRACT | Status: DC | PRN
Start: 2020-03-25 — End: 2020-03-26

## 2020-03-25 MED ORDER — POTASSIUM BICARBONATE-CITRIC ACID 20 MEQ EFFERVESCENT TABLET
20 mEq | Freq: Once | ORAL | Status: AC
Start: 2020-03-25 — End: 2020-03-25
  Administered 2020-03-25: 13:00:00 via ORAL

## 2020-03-25 MED ORDER — NORTRIPTYLINE 25 MG CAP
25 mg | Freq: Every evening | ORAL | Status: DC
Start: 2020-03-25 — End: 2020-03-26
  Administered 2020-03-25 – 2020-03-26 (×2): via ORAL

## 2020-03-25 MED ORDER — POLYETHYLENE GLYCOL 3350 17 GRAM (100 %) ORAL POWDER PACKET
17 gram | Freq: Every day | ORAL | Status: DC | PRN
Start: 2020-03-25 — End: 2020-03-26

## 2020-03-25 MED ORDER — TIZANIDINE 4 MG TAB
4 mg | Freq: Three times a day (TID) | ORAL | Status: DC
Start: 2020-03-25 — End: 2020-03-26
  Administered 2020-03-25 – 2020-03-26 (×4): via ORAL

## 2020-03-25 MED ORDER — PRIMIDONE 50 MG TAB
50 mg | Freq: Every evening | ORAL | Status: DC
Start: 2020-03-25 — End: 2020-03-26
  Administered 2020-03-25 – 2020-03-26 (×2): via ORAL

## 2020-03-25 MED ORDER — SODIUM CHLORIDE 0.9 % IV PIGGY BACK
1 gram | INTRAVENOUS | Status: DC
Start: 2020-03-25 — End: 2020-03-26
  Administered 2020-03-25: 20:00:00 via INTRAVENOUS

## 2020-03-25 MED ORDER — ENOXAPARIN 40 MG/0.4 ML SUB-Q SYRINGE
40 mg/0.4 mL | Freq: Every day | SUBCUTANEOUS | Status: DC
Start: 2020-03-25 — End: 2020-03-26
  Administered 2020-03-25 – 2020-03-26 (×2): via SUBCUTANEOUS

## 2020-03-25 MED ORDER — VALACYCLOVIR 500 MG TAB
500 mg | Freq: Every day | ORAL | Status: DC
Start: 2020-03-25 — End: 2020-03-26
  Administered 2020-03-26: 12:00:00 via ORAL

## 2020-03-25 MED ORDER — PREDNISOLONE ACETATE 1 % EYE DROPS, SUSP
1 % | Freq: Every day | OPHTHALMIC | Status: DC
Start: 2020-03-25 — End: 2020-03-26
  Administered 2020-03-25 – 2020-03-26 (×2): via OPHTHALMIC

## 2020-03-25 MED ORDER — SODIUM CHLORIDE 0.9 % IJ SYRG
Freq: Three times a day (TID) | INTRAMUSCULAR | Status: DC
Start: 2020-03-25 — End: 2020-03-26
  Administered 2020-03-25 – 2020-03-26 (×5): via INTRAVENOUS

## 2020-03-25 MED ORDER — BENZONATATE 100 MG CAP
100 mg | Freq: Three times a day (TID) | ORAL | Status: DC | PRN
Start: 2020-03-25 — End: 2020-03-26
  Administered 2020-03-25 – 2020-03-26 (×3): via ORAL

## 2020-03-25 MED ORDER — ERGOCALCIFEROL (VITAMIN D2) 50,000 UNIT CAP
1250 mcg (50,000 unit) | ORAL | Status: DC
Start: 2020-03-25 — End: 2020-03-26
  Administered 2020-03-25: 13:00:00 via ORAL

## 2020-03-25 MED ORDER — FOLIC ACID 1 MG TAB
1 mg | Freq: Every day | ORAL | Status: DC
Start: 2020-03-25 — End: 2020-03-26
  Administered 2020-03-25 – 2020-03-26 (×2): via ORAL

## 2020-03-25 MED ORDER — AZELASTINE 137 MCG NASAL SPRAY AEROSOL
137 mcg (0.1 %) | Freq: Two times a day (BID) | NASAL | Status: DC
Start: 2020-03-25 — End: 2020-03-26
  Administered 2020-03-25 – 2020-03-26 (×4): via NASAL

## 2020-03-25 MED ORDER — TOPIRAMATE 100 MG TAB
100 mg | Freq: Two times a day (BID) | ORAL | Status: DC
Start: 2020-03-25 — End: 2020-03-26
  Administered 2020-03-25 – 2020-03-26 (×3): via ORAL

## 2020-03-25 MED ORDER — CYCLOBENZAPRINE 10 MG TAB
10 mg | Freq: Every evening | ORAL | Status: DC
Start: 2020-03-25 — End: 2020-03-26
  Administered 2020-03-25 – 2020-03-26 (×2): via ORAL

## 2020-03-25 MED ORDER — VIAL-MATE ADAPTOR
500 mg | Status: DC
Start: 2020-03-25 — End: 2020-03-24

## 2020-03-25 MED ORDER — SODIUM CHLORIDE 0.9 % IV
500 mg | INTRAVENOUS | Status: DC
Start: 2020-03-25 — End: 2020-03-26
  Administered 2020-03-25: 21:00:00 via INTRAVENOUS

## 2020-03-25 MED FILL — BENZONATATE 100 MG CAP: 100 mg | ORAL | Qty: 2

## 2020-03-25 MED FILL — FOLIC ACID 1 MG TAB: 1 mg | ORAL | Qty: 1

## 2020-03-25 MED FILL — TOPIRAMATE 100 MG TAB: 100 mg | ORAL | Qty: 2

## 2020-03-25 MED FILL — CYCLOBENZAPRINE 10 MG TAB: 10 mg | ORAL | Qty: 1

## 2020-03-25 MED FILL — DEXAMETHASONE 4 MG TAB: 4 mg | ORAL | Qty: 1

## 2020-03-25 MED FILL — VALACYCLOVIR 500 MG TAB: 500 mg | ORAL | Qty: 1

## 2020-03-25 MED FILL — NORTRIPTYLINE 25 MG CAP: 25 mg | ORAL | Qty: 2

## 2020-03-25 MED FILL — FAMOTIDINE 20 MG TAB: 20 mg | ORAL | Qty: 2

## 2020-03-25 MED FILL — CEFTRIAXONE 1 GRAM SOLUTION FOR INJECTION: 1 gram | INTRAMUSCULAR | Qty: 1

## 2020-03-25 MED FILL — HYDROXYCHLOROQUINE 200 MG TAB: 200 mg | ORAL | Qty: 1

## 2020-03-25 MED FILL — GABAPENTIN 300 MG CAP: 300 mg | ORAL | Qty: 1

## 2020-03-25 MED FILL — HYDROCODONE-ACETAMINOPHEN 10 MG-325 MG TAB: 10-325 mg | ORAL | Qty: 2

## 2020-03-25 MED FILL — MAPAP (ACETAMINOPHEN) 325 MG TABLET: 325 mg | ORAL | Qty: 2

## 2020-03-25 MED FILL — PRIMIDONE 50 MG TAB: 50 mg | ORAL | Qty: 1

## 2020-03-25 MED FILL — VITAMIN D2 1,250 MCG (50,000 UNIT) CAPSULE: 1250 mcg (50,000 unit) | ORAL | Qty: 1

## 2020-03-25 MED FILL — AZITHROMYCIN 500 MG IV SOLUTION: 500 mg | INTRAVENOUS | Qty: 5

## 2020-03-25 MED FILL — AZELASTINE 137 MCG NASAL SPRAY AEROSOL: 137 mcg (0.1 %) | NASAL | Qty: 1

## 2020-03-25 MED FILL — TIZANIDINE 4 MG TAB: 4 mg | ORAL | Qty: 1

## 2020-03-25 MED FILL — BD POSIFLUSH NORMAL SALINE 0.9 % INJECTION SYRINGE: INTRAMUSCULAR | Qty: 40

## 2020-03-25 MED FILL — LINZESS 145 MCG CAPSULE: 145 mcg | ORAL | Qty: 1

## 2020-03-25 MED FILL — ENOXAPARIN 40 MG/0.4 ML SUB-Q SYRINGE: 40 mg/0.4 mL | SUBCUTANEOUS | Qty: 0.4

## 2020-03-25 MED FILL — TRIAMTERENE-HYDROCHLOROTHIAZIDE 37.5 MG-25 MG TAB: ORAL | Qty: 1

## 2020-03-25 MED FILL — POTASSIUM CHLORIDE SR 10 MEQ TAB: 10 mEq | ORAL | Qty: 1

## 2020-03-25 MED FILL — PREDNISOLONE ACETATE 1 % EYE DROPS, SUSP: 1 % | OPHTHALMIC | Qty: 5

## 2020-03-25 MED FILL — DULOXETINE 30 MG CAP, DELAYED RELEASE: 30 mg | ORAL | Qty: 2

## 2020-03-25 MED FILL — EFFER-K 20 MEQ EFFERVESCENT TABLET: 20 mEq | ORAL | Qty: 2

## 2020-03-25 MED FILL — POTASSIUM CHLORIDE SR 10 MEQ TAB: 10 mEq | ORAL | Qty: 2

## 2020-03-25 NOTE — Progress Notes (Signed)
Bedside shift change report given to Terry Cruggs, RN (oncoming nurse) by Waree Panyason, RN (offgoing nurse). Report included the following information SBAR, Kardex, Procedure Summary, Intake/Output, MAR, Accordion, Recent Results, Med Rec Status, Cardiac Rhythm NSR, Alarm Parameters , Pre Procedure Checklist, Procedure Verification, Quality Measures and Dual Neuro Assessment.

## 2020-03-25 NOTE — Progress Notes (Signed)
Pt. Family did not bring medication list in today. Patient's family will bring medication list in the morning and well give to day shift nurse.

## 2020-03-25 NOTE — Progress Notes (Signed)
Problem: Falls - Risk of  Goal: *Absence of Falls  Description: Document Katherine [name] Fall Risk and appropriate interventions in the flowsheet.  Outcome: Progressing Towards Goal  Note: Fall Risk Interventions:  Mobility Interventions: Assess mobility with egress test, Bed/chair exit alarm, Communicate number of staff needed for ambulation/transfer, OT consult for ADLs, Patient to call before getting OOB, PT Consult for mobility concerns, PT Consult for assist device competence, Strengthening exercises (ROM-active/passive), Utilize walker, cane, or other assistive device         Medication Interventions: Assess postural VS orthostatic hypotension, Bed/chair exit alarm, Evaluate medications/consider consulting pharmacy, Patient to call before getting OOB, Teach patient to arise slowly    Elimination Interventions: Bed/chair exit alarm, Call light in reach, Elevated toilet seat, Patient to call for help with toileting needs, Stay With Me (per policy), Toileting schedule/hourly rounds    History of Falls Interventions: Bed/chair exit alarm, Consult care management for discharge planning, Door open when patient unattended, Evaluate medications/consider consulting pharmacy, Investigate reason for fall, Room close to nurse's station, Vital signs minimum Q4HRs X 24 hrs (comment for end date)         Problem: Patient Education: Go to Patient Education Activity  Goal: Patient/Family Education  Outcome: Progressing Towards Goal     Problem: Pain  Goal: *Control of Pain  Outcome: Progressing Towards Goal  Goal: *PALLIATIVE CARE:  Alleviation of Pain  Outcome: Progressing Towards Goal     Problem: Patient Education: Go to Patient Education Activity  Goal: Patient/Family Education  Outcome: Progressing Towards Goal     Problem: Pressure Injury - Risk of  Goal: *Prevention of pressure injury  Description: Document Braden Scale and appropriate interventions in the flowsheet.  Outcome: Progressing Towards Goal  Note: Pressure Injury  Interventions:                 Mobility Interventions: HOB 30 degrees or less, Turn and reposition approx. every two hours(pillow and wedges)                          Problem: Patient Education: Go to Patient Education Activity  Goal: Patient/Family Education  Outcome: Progressing Towards Goal     Problem: Patient Education: Go to Patient Education Activity  Goal: Patient/Family Education  Outcome: Progressing Towards Goal     Problem: Sepsis: Day of Diagnosis  Goal: Off Pathway (Use only if patient is Off Pathway)  Outcome: Progressing Towards Goal  Goal: *Fluid resuscitation  Outcome: Progressing Towards Goal  Goal: *Paired blood cultures prior to first dose of antibiotic  Outcome: Progressing Towards Goal  Goal: *First dose of  appropriate antibiotic within 3 hours of arrival to ED, within 1 hour of arrival to ICU  Outcome: Progressing Towards Goal  Goal: *Lactic acid with first set of blood cultures  Outcome: Progressing Towards Goal  Goal: *Pneumococcal immunization (if eligible)  Outcome: Progressing Towards Goal  Goal: *Influenza immunization (if eligible)  Outcome: Progressing Towards Goal  Goal: Activity/Safety  Outcome: Progressing Towards Goal  Goal: Consults, if ordered  Outcome: Progressing Towards Goal  Goal: Diagnostic Test/Procedures  Outcome: Progressing Towards Goal  Goal: Nutrition/Diet  Outcome: Progressing Towards Goal  Goal: Discharge Planning  Outcome: Progressing Towards Goal  Goal: Medications  Outcome: Progressing Towards Goal  Goal: Respiratory  Outcome: Progressing Towards Goal  Goal: Treatments/Interventions/Procedures  Outcome: Progressing Towards Goal  Goal: Psychosocial  Outcome: Progressing Towards Goal

## 2020-03-25 NOTE — Progress Notes (Signed)
Bedside shift change report given to Waree Panyason, RN (oncoming nurse) by Terry Cruggs, RN (offgoing nurse). Report included the following information SBAR, Kardex, ED Summary, Intake/Output, MAR, Accordion, Recent Results, Med Rec Status, Cardiac Rhythm NSR, Alarm Parameters , Pre Procedure Checklist, Procedure Verification, Quality Measures and Dual Neuro Assessment.

## 2020-03-25 NOTE — Progress Notes (Signed)
Received message from patient's nurse stating:    Pt. requested for home medication HYDROcodone-acetaminophen (NORCO) 10-325 mg tablet, 2 tablets q6 PRN for chronic back pain.         Discussion / orders:    Reviewed PTA home medication list  Ordered Norco 10-325 mg tablet, 2 tablets by mouth every 6 hours as needed for severe pain           Please note that this note was dictated using Training and development officer.  Quite often unanticipated grammatical, syntax, homophones, and other interpretive errors are inadvertently transcribed by the computer software.  Please disregard these errors.  Please excuse any errors that have escaped final proofreading.

## 2020-03-25 NOTE — Progress Notes (Signed)
Progress  Notes by Christell Faith, MD at 03/25/20 1018                Author: Christell Faith, MD  Service: Internal Medicine  Author Type: Physician       Filed: 03/25/20 1022  Date of Service: 03/25/20 1018  Status: Signed          Editor: Christell Faith, MD (Physician)                       Hospitalist Progress Note      NAME: Katherine [name]    DOB:  Sep 08, 1955    MRN:  [id number]          Assessment / Plan:        Sepsis secondary to community-acquired pneumonia failed outpatient antibiotic therapy, POA   -Presented with fever, tachycardia meeting criteria for sepsis   -X-ray with right lower lobe pneumonia   -Started on azithromycin and ceftriaxone, continue   -Follow-up blood culture   -Vaccinated against COVID-19 pneumonia   -On room air currently   -Afebrile since admission   -No leukocytosis   -Rapid COVID-19 negative   ??   Depression   -Continue home medication   ??   Hypokalemia    -Replace   -Follow BMP      History of connective tissue diseases,   -Admission in the shortness she has history of lupus, fibromyalgia, RA   -Chronically on dexamethasone, hydroxychloroquine, continue for now   ??      30.0 - 39.9 Obese  / Body mass index is 35.23 kg/m??.      Estimated discharge date: August 2   Barriers: Clinical improvement      Code status: Full   Prophylaxis: Lovenox   Recommended Disposition: Home w/Family          Subjective:        Chief Complaint / Reason for Physician Visit   Discussed with RN events overnight.       Patient was seen and examined.   No acute events overnight.      Discussed with RN overnight events.      All patient's questions were answered.      "Feeling the same from yesterday"      Review of Systems:           Symptom  Y/N  Comments    Symptom  Y/N  Comments             Fever/Chills  n      Chest Pain  n               Poor Appetite        Edema         Cough  n      Abdominal Pain  n               Sputum        Joint Pain         SOB/DOE  y      Pruritis/Rash          Nausea/vomit  n      Tolerating PT/OT         Diarrhea        Tolerating Diet  y               Constipation        Other  Could NOT obtain due to:                  Objective:        VITALS:    Last 24hrs VS reviewed since prior progress note. Most recent are:   Patient Vitals for the past 24 hrs:            Temp  Pulse  Resp  BP  SpO2            03/25/20 0739  98.7 ??F (37.1 ??C)  99  18  134/60  98 %            03/25/20 0306  --  92  --  --  96 %     03/25/20 0305  98.2 ??F (36.8 ??C)  91  16  (!) 142/77  96 %     03/24/20 2240  99.6 ??F (37.6 ??C)  88  16  (!) 150/77  99 %     03/24/20 2130  98.5 ??F (36.9 ??C)  88  18  (!) 130/58  98 %     03/24/20 2000  --  --  --  113/63  98 %     03/24/20 1930  98.8 ??F (37.1 ??C)  91  20  134/62  99 %     03/24/20 1911  --  98  --  135/62  99 %     03/24/20 1850  --  --  --  --  100 %            03/24/20 1711  (!) 100.6 ??F (38.1 ??C)  (!) 102  18  (!) 146/72  100 %           Intake/Output Summary (Last 24 hours) at 03/25/2020 1018   Last data filed at 03/25/2020 0353     Gross per 24 hour        Intake  500 ml        Output  400 ml        Net  100 ml            I had a face to face encounter and independently examined this patient on 03/25/2020, as outlined below:   PHYSICAL EXAM:   General: WD, WN. Alert, cooperative, no acute distress     EENT:  EOMI. Anicteric sclerae. MMM   Resp:  CTA bilaterally, no wheezing or rales.  No accessory muscle use   CV:  Regular  rhythm,  No edema   GI:  Soft, Non distended, Non tender.  +Bowel sounds   Neurologic:  Alert and oriented X 3, normal speech,    Psych:   Good insight. Not anxious nor agitated   Skin:  No rashes.  No jaundice      Reviewed most current lab test results and cultures  YES   Reviewed most current radiology test results   YES   Review and summation of old records today    NO   Reviewed patient's current orders and MAR    YES   PMH/SH reviewed - no change compared to H&P    ________________________________________________________________________   Care Plan discussed with:           Comments         Patient  x           Family              RN  x  Care Manager             Consultant                                  Multidiciplinary team rounds were held today with case manager, nursing, pharmacist and clinical coordinator.  Patient's plan of care was discussed; medications  were reviewed and discharge planning was addressed.       ________________________________________________________________________   Total NON critical care TIME:  36   Minutes      Total CRITICAL CARE TIME Spent:   Minutes non procedure based              Comments         >50% of visit spent in counseling and coordination of care         ________________________________________________________________________   Christell Faith, MD       Procedures: see electronic medical records for all procedures/Xrays and details which were not copied into this note but were reviewed prior to creation of Plan.        LABS:   I reviewed today's most current labs and imaging studies.   Pertinent labs include:     Recent Labs            03/25/20   0315  03/24/20   1750     WBC  7.1  8.5     HGB  12.9  12.4     HCT  38.8  36.4         PLT  258  236          Recent Labs            03/25/20   0315  03/24/20   1750     NA  136  135*     K  3.3*  3.3*     CL  103  101     CO2  27  31     GLU  91  110*     BUN  10  10     CREA  0.63  0.72     CA  9.7  8.9     ALB   --   3.2*     TBILI   --   0.3         ALT   --   26           Signed: Christell Faith, MD

## 2020-03-25 NOTE — Progress Notes (Signed)
Problem: Falls - Risk of  Goal: *Absence of Falls  Description: Document Katherine [name] Fall Risk and appropriate interventions in the flowsheet.  Outcome: Progressing Towards Goal  Note: Fall Risk Interventions:  Mobility Interventions: Assess mobility with egress test, Bed/chair exit alarm, Communicate number of staff needed for ambulation/transfer, OT consult for ADLs, Patient to call before getting OOB, PT Consult for mobility concerns, PT Consult for assist device competence, Strengthening exercises (ROM-active/passive), Utilize walker, cane, or other assistive device         Medication Interventions: Assess postural VS orthostatic hypotension, Bed/chair exit alarm, Evaluate medications/consider consulting pharmacy, Patient to call before getting OOB, Teach patient to arise slowly         History of Falls Interventions: Bed/chair exit alarm, Consult care management for discharge planning, Door open when patient unattended, Evaluate medications/consider consulting pharmacy, Investigate reason for fall, Room close to nurse's station, Vital signs minimum Q4HRs X 24 hrs (comment for end date)         Problem: Patient Education: Go to Patient Education Activity  Goal: Patient/Family Education  Outcome: Progressing Towards Goal     Problem: Pain  Goal: *Control of Pain  Outcome: Progressing Towards Goal  Goal: *PALLIATIVE CARE:  Alleviation of Pain  Outcome: Progressing Towards Goal     Problem: Patient Education: Go to Patient Education Activity  Goal: Patient/Family Education  Outcome: Progressing Towards Goal     Problem: Pressure Injury - Risk of  Goal: *Prevention of pressure injury  Description: Document Braden Scale and appropriate interventions in the flowsheet.  Outcome: Progressing Towards Goal  Note: Pressure Injury Interventions:                                            Problem: Patient Education: Go to Patient Education Activity  Goal: Patient/Family Education  Outcome: Progressing Towards Goal     Problem:  Patient Education: Go to Patient Education Activity  Goal: Patient/Family Education  Outcome: Progressing Towards Goal     Problem: Sepsis: Day of Diagnosis  Goal: Off Pathway (Use only if patient is Off Pathway)  Outcome: Progressing Towards Goal  Goal: *Fluid resuscitation  Outcome: Progressing Towards Goal  Goal: *Paired blood cultures prior to first dose of antibiotic  Outcome: Progressing Towards Goal  Goal: *First dose of  appropriate antibiotic within 3 hours of arrival to ED, within 1 hour of arrival to ICU  Outcome: Progressing Towards Goal  Goal: *Lactic acid with first set of blood cultures  Outcome: Progressing Towards Goal  Goal: *Pneumococcal immunization (if eligible)  Outcome: Progressing Towards Goal  Goal: *Influenza immunization (if eligible)  Outcome: Progressing Towards Goal  Goal: Activity/Safety  Outcome: Progressing Towards Goal  Goal: Consults, if ordered  Outcome: Progressing Towards Goal  Goal: Diagnostic Test/Procedures  Outcome: Progressing Towards Goal  Goal: Nutrition/Diet  Outcome: Progressing Towards Goal  Goal: Discharge Planning  Outcome: Progressing Towards Goal  Goal: Medications  Outcome: Progressing Towards Goal  Goal: Respiratory  Outcome: Progressing Towards Goal  Goal: Treatments/Interventions/Procedures  Outcome: Progressing Towards Goal  Goal: Psychosocial  Outcome: Progressing Towards Goal

## 2020-03-26 LAB — METABOLIC PANEL, BASIC
Anion gap: 4 mmol/L — ABNORMAL LOW (ref 5–15)
BUN/Creatinine ratio: 24 — ABNORMAL HIGH (ref 12–20)
BUN: 15 MG/DL (ref 6–20)
CO2: 27 mmol/L (ref 21–32)
Calcium: 8.8 MG/DL (ref 8.5–10.1)
Chloride: 103 mmol/L (ref 97–108)
Creatinine: 0.62 MG/DL (ref 0.55–1.02)
GFR est AA: >60 mL/min/{1.73_m2} (ref >60)
GFR est non-AA: >60 mL/min/{1.73_m2} (ref >60)
Glucose: 112 mg/dL — ABNORMAL HIGH (ref 65–100)
Potassium: 3.4 mmol/L — ABNORMAL LOW (ref 3.5–5.1)
Sodium: 134 mmol/L — ABNORMAL LOW (ref 136–145)

## 2020-03-26 LAB — CBC W/O DIFF
ABSOLUTE NRBC: 0 10*3/uL (ref 0.00–0.01)
HCT: 37.1 % (ref 35.0–47.0)
HGB: 12.3 g/dL (ref 11.5–16.0)
MCH: 32.2 PG (ref 26.0–34.0)
MCHC: 33.2 g/dL (ref 30.0–36.5)
MCV: 97.1 FL (ref 80.0–99.0)
MPV: 9 FL (ref 8.9–12.9)
NRBC: 0 PER 100 WBC
PLATELET: 305 10*3/uL (ref 150–400)
RBC: 3.82 M/uL (ref 3.80–5.20)
RDW: 12.4 % (ref 11.5–14.5)
WBC: 5.7 10*3/uL (ref 3.6–11.0)

## 2020-03-26 LAB — BASIC METABOLIC PANEL
Anion Gap: 4 mmol/L — ABNORMAL LOW (ref 5–15)
BUN: 15 MG/DL (ref 6–20)
Bun/Cre Ratio: 24 — ABNORMAL HIGH (ref 12–20)
CO2: 27 mmol/L (ref 21–32)
Calcium: 8.8 MG/DL (ref 8.5–10.1)
Chloride: 103 mmol/L (ref 97–108)
Creatinine: 0.62 MG/DL (ref 0.55–1.02)
EGFR IF NonAfrican American: >60 mL/min/{1.73_m2} (ref >60)
GFR African American: >60 mL/min/{1.73_m2} (ref >60)
Glucose: 112 mg/dL — ABNORMAL HIGH (ref 65–100)
Potassium: 3.4 mmol/L — ABNORMAL LOW (ref 3.5–5.1)
Sodium: 134 mmol/L — ABNORMAL LOW (ref 136–145)

## 2020-03-26 LAB — CBC
Hematocrit: 37.1 % (ref 35.0–47.0)
Hemoglobin: 12.3 g/dL (ref 11.5–16.0)
MCH: 32.2 PG (ref 26.0–34.0)
MCHC: 33.2 g/dL (ref 30.0–36.5)
MCV: 97.1 FL (ref 80.0–99.0)
MPV: 9 FL (ref 8.9–12.9)
NRBC Absolute: 0 10*3/uL (ref 0.00–0.01)
Nucleated RBCs: 0 PER 100 WBC
Platelets: 305 10*3/uL (ref 150–400)
RBC: 3.82 M/uL (ref 3.80–5.20)
RDW: 12.4 % (ref 11.5–14.5)
WBC: 5.7 10*3/uL (ref 3.6–11.0)

## 2020-03-26 MED ORDER — LEVOFLOXACIN 750 MG TAB
750 mg | ORAL_TABLET | Freq: Every day | ORAL | 0 refills | Status: AC
Start: 2020-03-26 — End: 2020-03-31

## 2020-03-26 MED FILL — PRIMIDONE 50 MG TAB: 50 mg | ORAL | Qty: 1

## 2020-03-26 MED FILL — NORTRIPTYLINE 25 MG CAP: 25 mg | ORAL | Qty: 2

## 2020-03-26 MED FILL — CYCLOBENZAPRINE 10 MG TAB: 10 mg | ORAL | Qty: 1

## 2020-03-26 MED FILL — TIZANIDINE 4 MG TAB: 4 mg | ORAL | Qty: 1

## 2020-03-26 MED FILL — HYDROCODONE-ACETAMINOPHEN 10 MG-325 MG TAB: 10-325 mg | ORAL | Qty: 2

## 2020-03-26 MED FILL — DULOXETINE 30 MG CAP, DELAYED RELEASE: 30 mg | ORAL | Qty: 2

## 2020-03-26 MED FILL — ENOXAPARIN 40 MG/0.4 ML SUB-Q SYRINGE: 40 mg/0.4 mL | SUBCUTANEOUS | Qty: 0.4

## 2020-03-26 MED FILL — TRIAMTERENE-HYDROCHLOROTHIAZIDE 37.5 MG-25 MG TAB: ORAL | Qty: 1

## 2020-03-26 MED FILL — LINZESS 145 MCG CAPSULE: 145 mcg | ORAL | Qty: 1

## 2020-03-26 MED FILL — BENZONATATE 100 MG CAP: 100 mg | ORAL | Qty: 2

## 2020-03-26 MED FILL — VALACYCLOVIR 500 MG TAB: 500 mg | ORAL | Qty: 1

## 2020-03-26 MED FILL — POTASSIUM CHLORIDE SR 10 MEQ TAB: 10 mEq | ORAL | Qty: 1

## 2020-03-26 MED FILL — TOPIRAMATE 100 MG TAB: 100 mg | ORAL | Qty: 2

## 2020-03-26 MED FILL — HYDROXYCHLOROQUINE 200 MG TAB: 200 mg | ORAL | Qty: 1

## 2020-03-26 MED FILL — FOLIC ACID 1 MG TAB: 1 mg | ORAL | Qty: 1

## 2020-03-26 MED FILL — GABAPENTIN 300 MG CAP: 300 mg | ORAL | Qty: 1

## 2020-03-26 MED FILL — FAMOTIDINE 20 MG TAB: 20 mg | ORAL | Qty: 2

## 2020-03-26 MED FILL — AZITHROMYCIN 500 MG IV SOLUTION: 500 mg | INTRAVENOUS | Qty: 5

## 2020-03-26 MED FILL — DEXAMETHASONE 4 MG TAB: 4 mg | ORAL | Qty: 1

## 2020-03-26 NOTE — Progress Notes (Signed)
Bedside shift change report given to Clotilde Dieter, RN (oncoming nurse) by Reinaldo Berber, RN (offgoing nurse). Report included the following information SBAR, Kardex, ED Summary, Intake/Output, MAR, Accordion, Recent Results, Med Rec Status, Cardiac Rhythm NSR, Alarm Parameters , Pre Procedure Checklist, Procedure Verification, Quality Measures and Dual Neuro Assessment.

## 2020-03-26 NOTE — Progress Notes (Signed)
In a CM perspective, pt is ready for discharge. RN made aware.     Transition of Care Plan:    RUR: 13% - low risk  Disposition: Home  Follow up appointments: PCP  DME needed:No needs identified. Pt has cane, RW, and w/c at home.   Transportation at Discharge: boyfriend  Keys or means to access home:   yes     IM Medicare Letter: 2nd IM Medicare letter - received and signed 8/2/1  BCPI-A Bundle Letter: N/A  Caregiver Contact: Katherine [name], boyfriend, [phone]  Discharge Caregiver contacted prior to discharge?   Pt declined. She is in contact with him.     Reason for Admission:  Sepsis secondary to pneumonia                 RUR Score:   13%                  Plan for utilizing home health:     No needs identified.     PCP: First and Last name:  Katherine Bodo, MD   Name of Practice: [facility]   Are you a current patient: Yes/No:  yes   Approximate date of last visit: 12/2019   Can you participate in a virtual visit with your PCP: yes                    Current Advanced Directive/Advance Care Plan: Full Code   Advance Care Planning     General Advance Care Planning (ACP) Conversation      Date of Conversation: 03/26/2020  Conducted with: Patient with Decision Making Capacity    Healthcare Decision Maker:   Undecided    Today we discussed Healthcare Decision Makers. The patient is considering options. No ACP. Pt states that she is planning to see a lawyer to take care of this. States that she spoke to her son and he is unsure of being the primary Management consultant. Currently, she is unsure of who will be her primary decision maker. CM explained to pt the [facility] hierarchy. Pt verbalized understanding and agreed to get further information. CM gave pt a blank ACP form. Pt is currently being discharged.    Content/Action Overview:   Has NO ACP documents/care preferences - information provided, considering goals and options  Reviewed DNR/DNI and patient elects Full Code (Attempt Resuscitation)    Length of  Voluntary ACP Conversation in minutes:  <16 minutes (Non-Billable)      Healthcare Decision Maker:   Pt Undecided - pt is being discharged and has been given a blank ACP. Pt is planning to follow up.                        Transition of Care Plan:    Home    CM met with patient at the bedside to complete initial assessment. CM introduced role, verified demographics, and discussed discharge planning.    Katherine [name] is a 64 year old female admitted for sepsis secondary to pneumonia. She is insured by Medicare A/B and AARP. Pt uses the [facility] on [address] Pt lives with her boyfriend Katherine [name]) in a 1-story home that has 4 steps to enter. Pt is independent with ADLs and IADLs. Pt drives. She has a cane, walker, and w/c at home. Pt denies hx of HH and Inpt Rehab. Pt states she has been at [facility] before. At time of  discharge, her boyfriend will transport home.    CM is aware of discharge. CM at bedside to discuss discharge planning with pt. CM informed pt that her PCP requested for pt to schedule appt in 1 week. Pt verbalized understanding and has no questions / concerns for CM. Pt informed CM that she is contacting her boyfriend and updating him, CM does not need to. In a CM perspective, pt is ready for discharge. Primary RN made aware.     Medicare pt has received, reviewed, and signed 2nd IM letter informing them of their right to appeal the discharge.  Signed copied has been placed on pt bedside chart.    Unit CM will continue to follow.    Care Management Interventions  PCP Verified by CM: Yes (Dr. Allayne Butcher. last appt 5/21.)  Mode of Transport at Discharge: Other (see comment) (boyfriend)  Transition of Care Consult (CM Consult): Discharge Planning  Discharge Durable Medical Equipment: No  Physical Therapy Consult: No  Occupational Therapy Consult: No  Speech Therapy Consult: No  Current Support Network: Other (lives with boyfriend.)  Confirm Follow Up  Transport: Self (If pt is unable to drive, her boyfriend will transport. )  The Plan for Transition of Care is Related to the Following Treatment Goals : home  The Patient and/or Patient Representative was Provided with a Choice of Provider and Agrees with the Discharge Plan?: Yes  Name of the Patient Representative Who was Provided with a Choice of Provider and Agrees with the Discharge Plan: patient  Freedom of Choice List was Provided with Basic Dialogue that Supports the Patient's Individualized Plan of Care/Goals, Treatment Preferences and Shares the Quality Data Associated with the Providers?: Yes  Discharge Location  Discharge Placement: Home    Jarold Motto, RN, BSN, [facility]  [facility] Care Manager  [phone]                                .

## 2020-03-26 NOTE — Discharge Summary (Signed)
Discharge Summary by Christell Faith, MD at 03/26/20 1031                Author: Christell Faith, MD  Service: Internal Medicine  Author Type: Physician       Filed: 03/26/20 1034  Date of Service: 03/26/20 1031  Status: Signed          Editor: Christell Faith, MD (Physician)                                       Hospitalist Discharge Summary        Patient ID:   Katherine [name]   [id number]   64 y.o.   11-Dec-1955   03/24/2020      PCP on record: Danella Penton, MD      Admit date: 03/24/2020   Discharge date and time: 03/26/2020      DISCHARGE DIAGNOSIS:      Sepsis secondary to community-acquired pneumonia failed outpatient antibiotic therapy, POA            Hypovolemic hyponatremia   Hypokalemia      ??   Depression      ??   ??   History of connective tissue diseases,      CONSULTATIONS:   IP CONSULT TO HOSPITALIST      Excerpted HPI from H&P of Ahmed Lisabeth Pick, MD:   64 years old female with past medical history significant for lupus, fibromyalgia, arthritis, depression, history of pneumonia, rheumatoid arthritis presented to the hospital  for evaluation of shortness of breath associated with cough and fever started about 6 days ago, patient 2 days ago went to the urgent care and was diagnosed with pneumonia and was prescribed doxycycline, patient symptoms did not improve, patient was tested  for Covid came back negative, chest x-ray was done and show right lower lobe pneumonia, blood work show no significant acute abnormality.   ??      ______________________________________________________________________   DISCHARGE SUMMARY/HOSPITAL COURSE:   for full details see H&P, daily progress notes, labs, consult notes.          Sepsis secondary to community-acquired pneumonia failed outpatient antibiotic therapy, POA   -Presented with fever, tachycardia meeting criteria for sepsis   -X-ray with right lower lobe pneumonia   -Started on azithromycin and ceftriaxone, we will discharge on Levaquin for 5 more days to  complete total of 7 days   -Blood culture sent on admission, negative to date   -Vaccinated against COVID-19 pneumonia   -Continue to be on room air   -Afebrile since admission   -No leukocytosis   -Rapid COVID-19 negative   -We will need x-ray in 6 to 8 weeks with PCP      Hypovolemic hyponatremia   Hypokalemia   -Potassium replaced   -Encourage p.o. intake   ??   Depression   -Continue home medication   ??   ??   History of connective tissue diseases,   -Admission in the shortness she has history of lupus, fibromyalgia, RA   -Chronically on dexamethasone, hydroxychloroquine, continue for now         _______________________________________________________________________   Patient seen and examined by me on discharge day.   Pertinent Findings:   Gen:    Not in distress   Chest: Clear lungs   CVS:   Regular rhythm.  No edema   Abd:  Soft, not  distended, not tender   Neuro:  Alert,    _______________________________________________________________________   DISCHARGE MEDICATIONS:      Current Discharge Medication List              START taking these medications          Details        levoFLOXacin (Levaquin) 750 mg tablet  Take 1 Tablet by mouth daily for 5 days.   Qty: 5 Tablet, Refills:  0   Start date: 03/26/2020, End date:  03/31/2020                     CONTINUE these medications which have NOT CHANGED          Details        benzonatate (TESSALON) 200 mg capsule  Take 200 mg by mouth three (3) times daily as needed for Cough.               HYDROcodone-acetaminophen (NORCO) 10-325 mg tablet  Take 2 Tablets by mouth every six (6) hours as needed for Pain.               gabapentin (NEURONTIN) 300 mg capsule  Take 1 Cap by mouth two (2) times a day. Max Daily Amount: 600 mg.   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               primidone (Mysoline) 50 mg tablet   Take 1 Tablet by mouth nightly.   Qty: 90 Tablet, Refills:  3          Associated Diagnoses: Cervical radiculopathy due to degenerative joint disease of spine; Lumbar  back pain with radiculopathy affecting right lower extremity; Lumbar back pain with radiculopathy affecting left lower extremity; Benign essential tremor syndrome; Diabetic peripheral neuropathy ([place]); Syncope and collapse               albuterol (PROVENTIL HFA, VENTOLIN HFA, PROAIR HFA) 90 mcg/actuation inhaler  INHALE 1 TO 2 PUFFS BY MOUTH EVERY 4 TO 6 HOURS AS NEEDED               cyclobenzaprine (FLEXERIL) 10 mg tablet  cyclobenzaprine 10 mg tablet               famotidine (PEPCID) 40 mg tablet  TAKE 1 TABLET BY MOUTH TWICE DAILY AS DIRECTED               folic acid (FOLVITE) 1 mg tablet  TAKE 1 TABLET BY MOUTH EVERY DAY               methotrexate (RHEUMATREX) 2.5 mg tablet                 predniSONE (DELTASONE) 10 mg tablet                 dexAMETHasone (DECADRON) 4 mg tablet  Take 4 mg by mouth two (2) times daily (with meals).               tiZANidine (ZANAFLEX) 4 mg capsule  Take 4 mg by mouth three (3) times daily.               topiramate (  TOPAMAX) 200 mg tablet  Take 200 mg by mouth two (2) times a day.               flaxseed oil (OMEGA 3 PO)  Take 1 Cap by mouth daily.               nortriptyline (PAMELOR) 50 mg capsule  Take 50 mg by mouth nightly.               prenatal vit 91/iron/folic/dha (PRENATAL + DHA PO)  Take 1 Tab by mouth daily.               POTASSIUM CHLORIDE PO  Take 10 mEq by mouth two (2) times a day.               linaclotide (LINZESS) 145 mcg cap capsule  Take 145 mcg by mouth Daily (before breakfast).               hydroxychloroquine (PLAQUENIL) 200 mg tablet  Take 200 mg by mouth two (2) times a day.               valacyclovir (VALTREX) 1 g tablet  Take 500 mg by mouth every morning.               triamterene-hydrochlorothiazide (DYAZIDE) 37.5-25 mg per capsule  Take 1 Cap by mouth daily.               ergocalciferol  (VITAMIN D) 50,000 unit capsule  Take 50,000 Units by mouth every seven (7) days.               azelastine (ASTELIN) 137 mcg nasal spray  1 Spray by Nasal route two (2) times a day. Administer to right and left nostril.               prednisoLONE acetate (PRED FORTE) 1 % ophthalmic suspension  Administer 1 Drop to right eye every morning.                     STOP taking these medications                  doxycycline (MONODOX) 100 mg capsule  Comments:    Reason for Stopping:                                Patient Follow Up Instructions:    Activity: Activity as tolerated   Diet: Resume previous diet   Wound Care: None needed      Follow-up with PCP and obtain x-ray in 6 to 8 weeks        Follow-up Information               Follow up With  Specialties  Details  Why  Contact Info              Danella Penton, MD  Family Medicine  Schedule an appointment as soon as possible for a visit in 1 week    [address]   [address]   [place] [address]   [phone]                ________________________________________________________________      Risk of deterioration: Low      Condition at Discharge:  Stable   __________________________________________________________________      Disposition   Home with family, no needs  ____________________________________________________________________      Code Status: Full Code   ___________________________________________________________________         Total time in minutes spent coordinating this discharge (includes going over instructions, follow-up, prescriptions, and preparing report for sign off to her PCP) :  >30 minutes      Signed:   Christell Faith, MD

## 2020-03-26 NOTE — Progress Notes (Signed)
Bedside report received from waree rn

## 2020-03-26 NOTE — Progress Notes (Signed)
I have reviewed discharge instructions with the patient.  The patient verbalized understanding.    Discharge medications reviewed with patient and appropriate educational materials regarding medications and side effects teaching were provided.    Site care instructions reviewed with patient. Pain management teaching completed.    Patient instructed to make follow up appointments per discharge instructions.     Patient belongings packed up and accounted for with patient and family. All patient belongings sent home with patient.    Telemetry monitor and wires removed      Patient signed discharge instructions after reviewing them, and duplicate copy placed in chart.

## 2020-03-29 LAB — CULTURE, BLOOD, PAIRED
Culture result:: NO GROWTH
Culture: NO GROWTH

## 2020-05-07 ENCOUNTER — Encounter

## 2020-05-07 ENCOUNTER — Inpatient Hospital Stay: Admit: 2020-05-07 | Payer: MEDICARE | Primary: Family Medicine

## 2020-05-07 DIAGNOSIS — R0602 Shortness of breath: Secondary | ICD-10-CM

## 2020-07-12 ENCOUNTER — Encounter: Attending: Specialist | Primary: Family Medicine

## 2020-10-22 ENCOUNTER — Encounter

## 2020-10-31 ENCOUNTER — Inpatient Hospital Stay
Admit: 2020-10-31 | Payer: MEDICARE | Attending: Student in an Organized Health Care Education/Training Program | Primary: Family Medicine

## 2020-10-31 DIAGNOSIS — R3121 Asymptomatic microscopic hematuria: Secondary | ICD-10-CM

## 2020-10-31 LAB — CREATININE, POC
Creatinine (POC): 0.8 mg/dL (ref 0.6–1.3)
GFRAA, POC: >60 mL/min/{1.73_m2} (ref >60)
GFRNA, POC: >60 mL/min/{1.73_m2} (ref >60)

## 2020-10-31 LAB — AMB POC CREATININE
GFR African American: >60 mL/min/{1.73_m2} (ref >60)
GFR Non-African American: >60 mL/min/{1.73_m2} (ref >60)
POC Creatinine: 0.8 mg/dL (ref 0.6–1.3)

## 2020-10-31 MED ORDER — IOPAMIDOL 76 % IV SOLN
76 % | Freq: Once | INTRAVENOUS | Status: AC
Start: 2020-10-31 — End: 2020-10-31
  Administered 2020-10-31: 20:00:00 via INTRAVENOUS

## 2020-10-31 MED FILL — ISOVUE-370  76 % INTRAVENOUS SOLUTION: 370 mg iodine /mL (76 %) | INTRAVENOUS | Qty: 100

## 2020-12-04 ENCOUNTER — Encounter

## 2020-12-05 ENCOUNTER — Inpatient Hospital Stay: Admit: 2020-12-05 | Payer: MEDICARE | Primary: Family Medicine

## 2020-12-05 ENCOUNTER — Ambulatory Visit
Admit: 2020-12-05 | Discharge: 2020-12-05 | Payer: MEDICARE | Attending: Adult Reconstructive Orthopaedic Surgery | Primary: Family Medicine

## 2020-12-05 ENCOUNTER — Ambulatory Visit: Attending: Adult Reconstructive Orthopaedic Surgery | Primary: Family Medicine

## 2020-12-05 DIAGNOSIS — M25552 Pain in left hip: Secondary | ICD-10-CM

## 2020-12-05 DIAGNOSIS — M7062 Trochanteric bursitis, left hip: Secondary | ICD-10-CM

## 2020-12-05 MED ORDER — BETAMETHASONE ACET & SOD PHOS 6 MG/ML SUSP FOR INJECTION
6 mg/mL | Freq: Once | INTRAMUSCULAR | Status: AC
Start: 2020-12-05 — End: 2020-12-05
  Administered 2020-12-05: 19:00:00 via INTRABURSAL

## 2020-12-05 NOTE — Progress Notes (Signed)
 Identified pt with two pt identifiers (name and DOB). Reviewed chart in preparation for visit and have obtained necessary documentation.  Katherine [name] is a 65 y.o. female  Chief Complaint   Patient presents with   . Hip Pain     LT hip     Visit Vitals  BP 130/81 (BP 1 Location: Right arm, BP Patient Position: Sitting, BP Cuff Size: Large adult)   Pulse 81   Temp 96.8 F (36 C) (Tympanic)   Ht 5' 6 (1.676 m)   Wt 229 lb (103.9 kg)   SpO2 97%   BMI 36.96 kg/m     1. Have you been to the ER, urgent care clinic since your last visit?  Hospitalized since your last visit?No    2. Have you seen or consulted any other health care providers outside of the [facility] since your last visit?  Include any pap smears or colon screening. No

## 2020-12-05 NOTE — Progress Notes (Signed)
12/05/2020    Assessment: Peritrochanteric pain syndrome, left hip    Plan:  This patient and I did discuss at length the anatomy, which I drew out in office.  We discussed the potential causes of the issue, as well as the treatment options, which are largely nonoperative.  We discussed that a mixture of anti-inflammatory analgesics, cortisone injections, as well as consistent physical therapy for 4-6 weeks is the standard of care of this issue. Evidence indicates that over 90% of patients can begin to resolve their issues in that time.   The patient will proceed with injections, physical therapy.  The patient will then follow up in 1 weeks for a clinical check.       Chief Complaint: Left hip pain    HPI: This is a 65 y.o. patient who complains of left hip pain which is activity dependent. The patient has had activity dependent pain for 3 weeks after she fell onto the lateral aspect of the hip.     The pain is located in the lateral aspect of the hip, it radiates somewhat distally and laterally, it is severe in intensity.  The patient complains of difficulty sleeping on the left side, limitation in the normal activities of daily living.   The patient has tried activity modification, she does take chronic hydrocodone for other pain issues, no over the counter pain medications, she has not had physical therapy, injections have not been attempted.  No other surgery or pertinent history to this hip.      Past Medical History:   Diagnosis Date   ??? Adverse effect of anesthesia     hard time breathing after surgery, sister hard to wake up   ??? Arthritis    ??? Burning with urination    ??? Depression    ??? Diarrhea    ??? Diverticulosis    ??? Dizziness    ??? Easy bruising    ??? Fibromyalgia     chronic pain   ??? Headache(784.0)    ??? Hearing loss    ??? Ill-defined condition     past hx of shingles--eye/ear 13 years ago   ??? Lupus ([place])    ??? Meniere disease    ??? Night sweats    ??? Numbness     feet   ??? Rheumatoid arthritis, unspecified  ([place])    ??? Sleep apnea     did not tolerate cpap   ??? Sweats, menopausal    ??? Visual loss        Past Surgical History:   Procedure Laterality Date   ??? COLONOSCOPY N/A 09/15/2018    COLONOSCOPY performed by Christin Bach, MD at [facility]   ??? HX APPENDECTOMY     ??? HX BREAST BIOPSY Left     benign core bx   ??? HX CARPAL TUNNEL RELEASE Right 01/2014    with trigger finger   ??? HX CHOLECYSTECTOMY     ??? HX COLONOSCOPY     ??? HX CORNEAL TRANSPLANT Right    ??? HX ENDOSCOPY      with dilatation   ??? HX GYN      hysterectomy   ??? HX HEENT      two ear surgeries to try to fix meniere's disease, second one they used gentimycin, ruined her balance always dizzy   ??? HX HERNIA REPAIR      left, incisional after first bladder repair   ??? HX KNEE ARTHROSCOPY Right    ??? HX KNEE  REPLACEMENT Right    ??? HX ORTHOPAEDIC  1982, 1991    back x2, no hardware   ??? HX TONSILLECTOMY     ??? HX TOTAL COLECTOMY  1994    1 foot of colon removed   ??? HX UROLOGICAL      bladder surgery x2       Current Outpatient Medications on File Prior to Visit   Medication Sig Dispense Refill   ??? HYDROcodone-acetaminophen (NORCO) 10-325 mg tablet Take 2 Tablets by mouth every six (6) hours as needed for Pain.     ??? albuterol (PROVENTIL HFA, VENTOLIN HFA, PROAIR HFA) 90 mcg/actuation inhaler INHALE 1 TO 2 PUFFS BY MOUTH EVERY 4 TO 6 HOURS AS NEEDED     ??? cyclobenzaprine (FLEXERIL) 10 mg tablet cyclobenzaprine 10 mg tablet     ??? folic acid (FOLVITE) 1 mg tablet TAKE 1 TABLET BY MOUTH EVERY DAY     ??? predniSONE (DELTASONE) 10 mg tablet      ??? gabapentin (NEURONTIN) 300 mg capsule Take 1 Cap by mouth two (2) times a day. Max Daily Amount: 600 mg. 180 Cap 1   ??? topiramate (TOPAMAX) 200 mg tablet Take 200 mg by mouth two (2) times a day.     ??? flaxseed oil (OMEGA 3 PO) Take 1 Cap by mouth daily.     ??? POTASSIUM CHLORIDE PO Take 10 mEq by mouth two (2) times a day.     ??? linaclotide (LINZESS) 145 mcg cap capsule Take 145 mcg by mouth Daily (before breakfast).     ???  hydroxychloroquine (PLAQUENIL) 200 mg tablet Take 200 mg by mouth two (2) times a day.     ??? valacyclovir (VALTREX) 1 g tablet Take 500 mg by mouth every morning.     ??? duloxetine (CYMBALTA) 60 mg capsule Take 60 mg by mouth two (2) times a day.     ??? triamterene-hydrochlorothiazide (DYAZIDE) 37.5-25 mg per capsule Take 1 Cap by mouth daily.     ??? ergocalciferol (VITAMIN D) 50,000 unit capsule Take 50,000 Units by mouth every seven (7) days.     ??? azelastine (ASTELIN) 137 mcg nasal spray 1 Spray by Nasal route two (2) times a day. Administer to right and left nostril.     ??? prednisoLONE acetate (PRED FORTE) 1 % ophthalmic suspension Administer 1 Drop to right eye every morning.     ??? benzonatate (TESSALON) 200 mg capsule Take 200 mg by mouth three (3) times daily as needed for Cough. (Patient not taking: Reported on 12/05/2020)     ??? primidone (Mysoline) 50 mg tablet Take 1 Tablet by mouth nightly. (Patient not taking: Reported on 12/05/2020) 90 Tablet 3   ??? famotidine (PEPCID) 40 mg tablet TAKE 1 TABLET BY MOUTH TWICE DAILY AS DIRECTED (Patient not taking: Reported on 12/05/2020)     ??? methotrexate (RHEUMATREX) 2.5 mg tablet  (Patient not taking: Reported on 12/05/2020)     ??? dexAMETHasone (DECADRON) 4 mg tablet Take 4 mg by mouth two (2) times daily (with meals). (Patient not taking: Reported on 12/05/2020)     ??? tiZANidine (ZANAFLEX) 4 mg capsule Take 4 mg by mouth three (3) times daily. (Patient not taking: Reported on 12/05/2020)     ??? nortriptyline (PAMELOR) 50 mg capsule Take 50 mg by mouth nightly. (Patient not taking: Reported on 12/05/2020)     ??? prenatal vit 91/iron/folic/dha (PRENATAL + DHA PO) Take 1 Tab by mouth daily. (Patient not taking: Reported on  12/05/2020)       No current facility-administered medications on file prior to visit.       Allergies   Allergen Reactions   ??? Morphine Other (comments)     "it does not stop the pain"   ??? Pcn [Penicillins] Rash and Swelling       Family History   Problem  Relation Age of Onset   ??? Heart Disease Mother    ??? Cancer Mother         breast cancer   ??? Breast Cancer Mother 42   ??? Hypertension Father    ??? Diabetes Father    ??? Heart Disease Sister        Social History     Socioeconomic History   ??? Marital status: WIDOWED   Tobacco Use   ??? Smoking status: Former Smoker     Packs/day: 0.50     Years: 30.00     Pack years: 15.00   ??? Smokeless tobacco: Never Used   Vaping Use   ??? Vaping Use: Never used   Substance and Sexual Activity   ??? Alcohol use: Yes     Comment: rarely   ??? Drug use: Never         Review of Systems:       General: Denies headache, lethargy, fever, weight loss  Ears/Nose/Throat: Denies ear discharge, drainage, nosebleeds, hoarse voice, dental problems  Cardiovascular: Denies chest pain, shortness of breath  Lungs: Denies chest pain, breathing problems, wheezing, pneumonia  Stomach: Denies stomach pain, heartburn, constipation, irritable bowel  Skin: Denies rash, sores, open wounds  Musculoskeletal: Admits to lateral hip pain  Genitourinary: Denies dysuria, hematuria, polyuria  Gastrointestinal: Denies constipation, obstipation, diarrhea  Neurological: Denies changes in sight, smell, hearing, taste, seizures. Denies loss of consciousness.  Psychiatric: Denies depression, sleep pattern changes, anxiety, change in personality  Endocrine: Denies mood swings, heat or cold intolerance  Hematologic/Lymphatic: Denies anemia, purpura, petechia  Allergic/Immunologic: Denies swelling of throat, pain or swelling at lymph nodes      Physical Examination:    Visit Vitals  BP 130/81 (BP 1 Location: Right arm, BP Patient Position: Sitting, BP Cuff Size: Large adult)   Pulse 81   Temp 96.8 ??F (36 ??C) (Tympanic)   Ht 5\' 6"  (1.676 m)   Wt 229 lb (103.9 kg)   SpO2 97%   BMI 36.96 kg/m??        General: AOX3, no apparent distress  Psychiatric: mood and affect appropriate  Lungs: breathing is symmetric and unlabored bilaterally  Heart: regular rate and rhythm  Abdomen: no  guarding  Head: normocephalic, atraumatic  Skin: No significant abnormalities, good turgor  Sensation intact to light touch: L1-S1 dermatomes  Muscular exam: 5/5 strength in all major muscle groups unless noted in specialty exam.    Extremities      Left upper extremity: Full active and passive range of motion without pain, deformity, no open wound, strength 5/5 in all major muscle groups.    Right upper extremity: Full active and passive range of motion without pain, deformity, no open wound, strength 5/5 in all major muscle groups.    Right lower extremity: Full active and passive range of motion without pain, deformity, no open wound, strength 5/5 in all major muscle groups.    Left lower extremity:  No deformity is noted.  Circumduction of the hip causes minimal pain in the groin.   Palpation of the abductors as well as lateral aspect of the  hip at the peritrochanteric bursa region is painful and reproductive of the chief complaint.  Range of motion is full, with a negative impingement sign.  FABER test is negative.  Gait is reciprocal.   Sensation is intact to light touch in the L1-S1 dermatomes.  Skin is intact about the hip.  Hip flexion strength is 5/5 and abduction strength is 5/5, hip extension and knee extension as well as tibialis anterior and EHL/GCS are 5/5 strength.    Diagnostics:    Pertinent Xrays:  Pelvis and hip xrays indicate no fractures, dislocations, femoroacetabular joint is well mainatined.  There are  calcifcations at the tip of the greater trochanter at the insertion of the gluteus tendons.  There may be an old healed fracture of the greater trochanter as well            Ms. Jorstad has a reminder for a "due or due soon" health maintenance. I have asked that she contact her primary care provider for follow-up on this health maintenance.             12/05/2020  PROCEDURE NOTE: Left Hip Peritrochanteric Cortisone Injection    After consent was signed, the patient's left hip was prepped  using a chlorhexidine scrub.  Once  sterile, a timeout was performed and a 22 gauge needle was used to inject 5cc of 1% lidocaine through the subcutaneous tissues and iliotibial band in the peritrochanteric area nearest the apex of tenderness.  Once adequate anesthesia was confirmed, a mixture of 6mg  of betamethasone and 5 cc of 1% lidocaine were injected below the iliotibial band in the same area.  Needle was removed and a sterile dressing applied.  Patient tolerated well without complication.  Post injection instructions were given.

## 2020-12-13 ENCOUNTER — Encounter: Attending: Adult Reconstructive Orthopaedic Surgery | Primary: Family Medicine

## 2021-04-08 ENCOUNTER — Ambulatory Visit: Admit: 2021-04-08 | Payer: MEDICARE | Attending: Family | Primary: Family Medicine

## 2021-04-08 ENCOUNTER — Ambulatory Visit: Attending: Family | Primary: Family Medicine

## 2021-04-08 DIAGNOSIS — G43009 Migraine without aura, not intractable, without status migrainosus: Secondary | ICD-10-CM

## 2021-04-08 MED ORDER — NORTRIPTYLINE 25 MG CAP
25 mg | ORAL_CAPSULE | Freq: Every evening | ORAL | 5 refills | Status: DC
Start: 2021-04-08 — End: 2021-05-09

## 2021-04-08 NOTE — Progress Notes (Signed)
Chief Complaint   Patient presents with    Follow-up    Migraine    1. Have you been to the ER, urgent care clinic since your last visit?  Hospitalized since your last visit? Yes for pneumonia    2. Have you seen or consulted any other health care providers outside of the [facility] since your last visit?  Dr Gilford Rile Include any pap smears or colon screening.      Pt reporting daily headaches with no relief. Pain mostly in right eye. Reports shingles in eye as well

## 2021-04-08 NOTE — Progress Notes (Signed)
[facility]  [address]  [place], [place]  Tel: [phone]  Fax: [phone]      Date:  04/08/21     Name:  Katherine [name]  DOB:  [birth year]  MRN:  [id number]     PCP:  Danella Penton, MD    Chief Complaint   Patient presents with    Follow-up    Migraine       HISTORY OF PRESENT ILLNESS:  Patient presents today for worsening migraine/headache.  She notes that she had shingles when she was 65 years old, sometimes they can reoccur, but it hasn't for quite a while. 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  C/o worsening headaches about 3 weeks ago, Samples of Nurtec was given by PCP and it helped, but was very expensive. So they rx'ed Maxalt instead, but made her very sleepy.  Excedrin Migraine  didn't help.  Yesterday noted some relief, but its back today. Right side of face into the eye and on the side of the head.  Starts to feel it in the eye and it gradually worsens, c/o nausea. C/o light sensitivity.  Sunglasses, dark room helps,  Takes Neurontin '400mg'$  at night for FM and Lupus.  Topamax '200mg'$  BID: doesn't seem to be working well.  Nortriptyline '50mg'$ : doesn't take it anymore.  15+/30 days she has had a headache.  Takes daily steroids.  No regular exercise, she uses furniture to help keep her balance, fell recently.    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  We will get an MRI of the cervical spine to check that.  In view of her progressive memory loss, dementia, increasing myoclonic jerks, unsteadiness, generalized weakness, cognitive loss, she needs an MRI of the brain to rule out structural disease of the brain rule out vascular  disease of the brain without normal pressure hydrocephalus, rule out posterior fossa lesion, rule out multi-infarct dementia, or any other possible treatable cause of her symptoms.  Complete metabolic panel sent to rule out neuropathy, rule out memory loss including [id number] and folic acid and vitamin D and thyroid and she is to check back with her rheumatologist about taking both Decadron and prednisone and taking both Flexeril and Zanaflex to make sure she is not being overly medicated and she is also already on a fairly good dose of narcotics taking 2 hydrocodone 3 times a day.  We will increase the Mysoline 50 mg in the morning and 100 mg at night and gradually titrate up as tolerated and needed.  65 minutes per the patient, going over her history, reviewing all her x-rays, reviewing her previous EMGs, reviewing all her other notes, and discussing her diagnosis prognosis treatment options all with her in detail.  For her fibromyalgia, she is going to work with her rheumatologist, consider regular exercise consider physical therapy and continue her current medications as prescribed by him.  She is advised that the main risk factors for stroke and vascular disease are high blood pressure cholesterol smoking and diabetes and poor diet, and to control these with her PCP do not smoke, and there is a warning signs of  stroke as far as be fast as balance, eyes, face, arms, sensory in time.  For the memory issues she is advised the best treatment to stay physically mentally active, try to exercise mildly about 30 minutes a day, and take her vitamins and vitamin D on a regular basis.  She is to follow-up with her rheumatologist and her internist for her medical problems and rheumatologic problems, and if her back gets worse she is to follow-up with Dr. Darnell Level or an orthopedic surgeon in the future.    REVIEW OF SYSTEMS:     Review of Systems   Eyes:  Positive for photophobia. Negative for blurred vision and double vision.         Red streak on her eye, needs to make appointment with Eye doctor.   Gastrointestinal:  Positive for nausea.   Musculoskeletal:  Positive for falls (about 3 weeks ago, hit her knee.) and joint pain.   Neurological:  Positive for dizziness (pt is off balance), weakness and headaches. Negative for tingling, tremors and sensory change.   Psychiatric/Behavioral:  Negative for depression. The patient is nervous/anxious (seems worse) and has insomnia.    All other systems reviewed and are negative.      Current Outpatient Medications   Medication Sig    rizatriptan (MAXALT) 10 mg tablet TAKE 1 TABLET (10 MG) BY ORAL ROUTE ONCE, MAY REPEAT AFTER 2 HOURS. NO MORE THAN 2/DAY    gabapentin (NEURONTIN) 400 mg capsule     predniSONE (DELTASONE) 5 mg tablet TAKE 1-2 TABLETS BY MOUTH EVERY MORNING    nortriptyline (PAMELOR) 25 mg capsule Take 1 Capsule by mouth nightly.    HYDROcodone-acetaminophen (NORCO) 10-325 mg tablet Take 2 Tablets by mouth every six (6) hours as needed for Pain.    cyclobenzaprine (FLEXERIL) 10 mg tablet cyclobenzaprine 10 mg tablet    folic acid (FOLVITE) 1 mg tablet TAKE 1 TABLET BY MOUTH EVERY DAY    topiramate (TOPAMAX) 200 mg tablet Take 200 mg by mouth two (2) times a day.    flaxseed oil (OMEGA 3 PO) Take 1 Cap by mouth daily.    POTASSIUM CHLORIDE PO Take 10 mEq by mouth daily.    linaCLOtide (LINZESS) 145 mcg cap capsule Take 145 mcg by mouth daily as needed.    hydrOXYchloroQUINE (PLAQUENIL) 200 mg tablet Take 200 mg by mouth two (2) times a day.    valACYclovir (VALTREX) 1 gram tablet Take 500 mg by mouth every morning.    DULoxetine (CYMBALTA) 60 mg capsule Take 60 mg by mouth two (2) times a day.    triamterene-hydrochlorothiazide (DYAZIDE) 37.5-25 mg per capsule Take 1 Cap by mouth daily.    ergocalciferol (ERGOCALCIFEROL) 1,250 mcg (50,000 unit) capsule Take 50,000 Units by mouth every seven (7) days.    azelastine (ASTELIN) 137 mcg (0.1 %) nasal spray 1 Spray by Nasal route two (2) times a  day. Administer to right and left nostril.    prednisoLONE acetate (PRED FORTE) 1 % ophthalmic suspension Administer 1 Drop to right eye every morning.     No current facility-administered medications for this visit.     Allergies   Allergen Reactions    Morphine Other (comments)     "it does not stop the pain"    Pcn [Penicillins] Rash and Swelling     Past Medical History:   Diagnosis Date    Adverse effect of anesthesia     hard time breathing after surgery, sister hard to wake up  Arthritis     Burning with urination     Depression     Diarrhea     Diverticulosis     Dizziness     Easy bruising     Fibromyalgia     chronic pain    Headache(784.0)     Hearing loss     Ill-defined condition     past hx of shingles--eye/ear 13 years ago    Lupus (HCC)     Meniere disease     Night sweats     Numbness     feet    Rheumatoid arthritis, unspecified (HCC)     Sleep apnea     did not tolerate cpap    Sweats, menopausal     Visual loss      Past Surgical History:   Procedure Laterality Date    COLONOSCOPY N/A 09/15/2018    COLONOSCOPY performed by Christin Bach, MD at [facility]    HX APPENDECTOMY      HX BREAST BIOPSY Left     benign core bx    HX CARPAL TUNNEL RELEASE Right 01/2014    with trigger finger    HX CHOLECYSTECTOMY      HX COLONOSCOPY      HX CORNEAL TRANSPLANT Right     HX ENDOSCOPY      with dilatation    HX GYN      hysterectomy    HX HEENT      two ear surgeries to try to fix meniere's disease, second one they used gentimycin, ruined her balance always dizzy    HX HERNIA REPAIR      left, incisional after first bladder repair    HX KNEE ARTHROSCOPY Right     HX KNEE REPLACEMENT Right     HX KNEE REPLACEMENT Right 2019    HX ORTHOPAEDIC  1982, 1991    back x2, no hardware    HX TONSILLECTOMY      HX TOTAL COLECTOMY  1994    1 foot of colon removed    HX UROLOGICAL      bladder surgery x2     Social History     Socioeconomic History    Marital status: WIDOWED     Spouse name: Not on file     Number of children: Not on file    Years of education: Not on file    Highest education level: Not on file   Occupational History    Not on file   Tobacco Use    Smoking status: Former     Packs/day: 0.50     Years: 30.00     Pack years: 15.00     Types: Cigarettes    Smokeless tobacco: Never   Vaping Use    Vaping Use: Never used   Substance and Sexual Activity    Alcohol use: Yes     Comment: rarely    Drug use: Never    Sexual activity: Not on file   Other Topics Concern    Not on file   Social History Narrative    Not on file     Social Determinants of Health     Financial Resource Strain: Not on file   Food Insecurity: Not on file   Transportation Needs: Not on file   Physical Activity: Not on file   Stress: Not on file   Social Connections: Not on file   Intimate Partner Violence: Not on file   Housing Stability:  Not on file     Family History   Problem Relation Age of Onset    Heart Disease Mother     Cancer Mother         breast cancer    Breast Cancer Mother 62    Hypertension Father     Diabetes Father     Heart Disease Sister      MRI Results (most recent):  Results from Hospital Encounter encounter on 02/09/20    MRI BRAIN W WO CONT    Narrative  EXAM:  MRI BRAIN W WO CONT    INDICATION:    memory loss    COMPARISON:  MRI brain 06/17/2007.    CONTRAST: 20 ml ProHance    TECHNIQUE:  Multiplanar multisequence acquisition without and with contrast of the brain.    FINDINGS:  The ventricles are normal in size and position. There is no acute infarct,  hemorrhage, extra-axial fluid collection, or mass effect. There is no cerebellar  tonsillar herniation. Expected arterial flow-voids are present. No evidence of  abnormal enhancement.    Trace bilateral mastoid effusions. The orbital contents are within normal  limits. No significant osseous or scalp lesions are identified.    Impression  1. No acute intracranial abnormality.  2. Trace bilateral mastoid effusions.    PHYSICAL EXAMINATION:    Visit Vitals  BP (!)  140/80 (BP 1 Location: Right arm, BP Patient Position: Sitting, BP Cuff Size: Adult)   Pulse 100   Temp 97.7 ??F (36.5 ??C) (Temporal)   Resp 16   Ht '5\' 6"'$  (1.676 m)   Wt 225 lb 6.4 oz (102.2 kg)   SpO2 96%   BMI 36.38 kg/m??       General:  Well defined, nourished, and well groomed individual in no acute distress.    Neck: Supple, nontender, normal range of motion.   Musculoskeletal:  Extremities revealed no edema and had full range of motion of joints.    Psych:  Good mood and bright affect, tearful at times.    NEUROLOGICAL EXAMINATION:     Mental Status:   Alert and oriented to person, place, and time with recent and remote memory intact.  Attention span and concentration are normal. Clear speech. Fund of knowledge preserved.    Cranial Nerves:   PERRLA.  Visual fields were full  EOM: no evidence of nystagmus  Facial sensation:  normal and symmetric  Facial motor: normal and symmetric, no facial droop noted.  Hearing intact  SCM strength intact  Tongue: midline without fasciculations    Motor Examination: Normal tone.  4+ /5 muscle strength in bilateral upper and lower extremities. No cogwheel rigidity.  No muscle wasting, no twitching or fasciculation noted.      Sensory exam:  Normal throughout to light touch in bilateral upper and lower extremities.    Coordination:   Finger to nose and rapid arm movement testing was normal.  No resting or intention tremor.  Patient was very wobbly with Romberg testing, negative pronator drift.      Gait and Station: Slow and antalgic    Reflexes:  DTRs 2+ in bilateral biceps, brachioradialis, patella .    ASSESSMENT AND PLAN      ICD-10-CM ICD-9-CM    1. Migraine without aura and without status migrainosus, not intractable  G43.009 346.10 REFERRAL TO PHYSICAL THERAPY      nortriptyline (PAMELOR) 25 mg capsule      2. Anxiety  F41.9 300.00  3. Fibromyalgia  M79.7 729.1 REFERRAL TO PHYSICAL THERAPY      4. Balance disorder  R26.89 781.99 REFERRAL TO PHYSICAL THERAPY         1. Migraine without aura and without status migrainosus, not intractable: Patient was seen by primary care who prescribed Nurtec, which was beneficial however was very expensive, she had previously been on nortriptyline for this.  Therefore, I will resume at a smaller dose 25 mg to take at night, she is already on Topamax twice daily, she also has a prescription for Maxalt 10 mg she notes some sedation with it advised her to try half tablet to see if that will minimize side effects.  Otherwise patient is continue working on identifying triggers, decreasing stressors, modifying lifestyle etc.  Consider injectable CGRP's next.  -     REFERRAL TO PHYSICAL THERAPY  -     nortriptyline (PAMELOR) 25 mg capsule; Take 1 Capsule by mouth nightly., Normal, Disp-30 Capsule, R-5  2. Anxiety: Patient notes her primary care Dr. Gilford Rile gave her a prescription for hydroxyzine that she takes periodically, list of mental health resources provided patient advised patient needs to find different ways to help with her anxiety and stressors..  3. Fibromyalgia: Physical therapy with dry needling has been ordered, follow-up with primary care as well as rheumatologist for ongoing management, they are prescribing her several medications to help with this.  -     REFERRAL TO PHYSICAL THERAPY  4. Balance disorder: Fall precautions recommended, physical therapy has been ordered  -     REFERRAL TO PHYSICAL THERAPY    Patient and/or family verbalized understand of all instructions and all questions/concerns were addressed.  Safety/side effects of medications discussed.    Patient remains a complex patient secondary to polypharmacy, significant comorbid conditions, and use of high-risk medications which complicate the decision making process related to patient's neurologic diagnosis.    I will see the patient back in 3 months, sooner if needed    Reina Fuse, FNP-BC

## 2021-05-09 ENCOUNTER — Encounter

## 2021-05-09 MED ORDER — NORTRIPTYLINE 25 MG CAP
25 mg | ORAL_CAPSULE | Freq: Every evening | ORAL | 1 refills | Status: AC
Start: 2021-05-09 — End: ?

## 2021-05-09 NOTE — Telephone Encounter (Signed)
Requested Prescriptions     Pending Prescriptions Disp Refills    nortriptyline (PAMELOR) 25 mg capsule 90 Capsule 1     Sig: Take 1 Capsule by mouth nightly.     Last visit & fill 04/08/21 # 30 days requesting 90 days    Next visit 07/15/21

## 2021-07-15 ENCOUNTER — Encounter: Attending: Family | Primary: Family Medicine

## 2021-07-15 ENCOUNTER — Inpatient Hospital Stay: Admit: 2021-07-15 | Payer: MEDICARE | Primary: Family Medicine

## 2021-07-15 ENCOUNTER — Encounter

## 2021-07-15 DIAGNOSIS — R059 Cough, unspecified: Secondary | ICD-10-CM

## 2021-10-23 ENCOUNTER — Ambulatory Visit: Admit: 2021-10-23 | Discharge: 2021-10-23 | Payer: MEDICARE | Attending: Surgery | Primary: Family Medicine

## 2021-10-23 ENCOUNTER — Ambulatory Visit: Attending: Surgery | Primary: Family Medicine

## 2021-10-23 DIAGNOSIS — R222 Localized swelling, mass and lump, trunk: Secondary | ICD-10-CM

## 2021-10-23 NOTE — Progress Notes (Signed)
Progress Notes by Luellen Pucker, MD at 10/23/21 1320                Author: Luellen Pucker, MD  Service: --  Author Type: Physician       Filed: 10/23/21 1401  Encounter Date: 10/23/2021  Status: Signed          Editor: Luellen Pucker, MD (Physician)               HISTORY OF PRESENT ILLNESS   Katherine [name] is a 66 y.o. female who comes in for consultation by Danella Penton, MD  for chest wall lesion   HPI   She noted a right anterior chest wall mass and tenderness about a month ago.   She denies trauma, skin changes or drainage.   It sits near the lower aspect of her bra.  She has a  hx dermal cysts and thought it may be that.  She saw Danella Penton, MD who referred her here.        Past Medical History:        Diagnosis  Date         ?  Adverse effect of anesthesia            hard time breathing after surgery, sister hard to wake up         ?  Arthritis       ?  Burning with urination       ?  Depression       ?  Diarrhea       ?  Diverticulosis       ?  Dizziness       ?  Easy bruising       ?  Fibromyalgia            chronic pain         ?  Headache(784.0)       ?  Hearing loss       ?  IBS (irritable bowel syndrome)       ?  Ill-defined condition            past hx of shingles--eye/ear 13 years ago         ?  Lupus ([place])       ?  Meniere disease       ?  Night sweats       ?  Numbness            feet         ?  Rheumatoid arthritis, unspecified ([place])       ?  Sleep apnea            did not tolerate cpap         ?  Sweats, menopausal           ?  Visual loss            Past Surgical History:         Procedure  Laterality  Date          ?  COLONOSCOPY  N/A  09/15/2018          COLONOSCOPY performed by Christin Bach, MD at [facility]          ?  HX APPENDECTOMY         ?  HX BREAST BIOPSY  Left  benign core bx          ?  HX CARPAL TUNNEL RELEASE  Right  01/2014          with trigger finger          ?  HX CHOLECYSTECTOMY         ?  HX COLONOSCOPY          ?  HX CORNEAL TRANSPLANT  Right       ?  HX ENDOSCOPY              with dilatation          ?  HX GYN              hysterectomy          ?  HX HEENT              two ear surgeries to try to fix meniere's disease, second one they used gentimycin, ruined her balance always dizzy          ?  HX HERNIA REPAIR              left, incisional after first bladder repair          ?  HX KNEE ARTHROSCOPY  Right       ?  HX KNEE REPLACEMENT  Right       ?  HX KNEE REPLACEMENT  Right  2019     ?  HX Cascade          back x2, no hardware          ?  HX TONSILLECTOMY         ?  HX TOTAL COLECTOMY    1994          1 foot of colon removed          ?  HX UROLOGICAL              bladder surgery x2          Family History         Problem  Relation  Age of Onset          ?  Heart Disease  Mother       ?  Cancer  Mother                breast cancer          ?  Breast Cancer  Mother  54     ?  Hypertension  Father       ?  Diabetes  Father            ?  Heart Disease  Sister            Social History          Tobacco Use         ?  Smoking status:  Every Day              Packs/day:  0.50         Years:  30.00         Pack years:  15.00         Types:  Cigarettes         ?  Smokeless tobacco:  Never       Vaping Use         ?  Vaping Use:  Never used  Substance Use Topics         ?  Alcohol use:  Yes             Comment: rarely         ?  Drug use:  Never          Current Outpatient Medications        Medication  Sig         ?  cimetidine (TAGAMET) 400 mg tablet  Take 1 Tablet by mouth two (2) times a day.     ?  meloxicam (MOBIC) 15 mg tablet  Take 15 mg by mouth daily.     ?  hydrOXYzine HCL (ATARAX) 10 mg tablet  Take 10 mg by mouth daily. Two tablets as needed.     ?  albuterol (PROVENTIL VENTOLIN) 2.5 mg /3 mL (0.083 %) nebu  Take 1 Each by inhalation as needed.     ?  busPIRone (BUSPAR) 10 mg tablet  Take 1 Tablet by mouth daily.     ?  hydrocortisone (ANUSOL-HC) 25 mg supp  Insert 1 Suppository into rectum  three (3) times daily.     ?  anifrolumab-fnia (Saphnelo) 300 mg/2 mL (150 mg/mL) soln IV solution  by IntraVENous route.     ?  gabapentin (NEURONTIN) 400 mg capsule       ?  predniSONE (DELTASONE) 5 mg tablet  TAKE 1-2 TABLETS BY MOUTH EVERY MORNING     ?  HYDROcodone-acetaminophen (NORCO) 10-325 mg tablet  Take 2 Tablets by mouth every six (6) hours as needed for Pain.     ?  cyclobenzaprine (FLEXERIL) 10 mg tablet  cyclobenzaprine 10 mg tablet     ?  folic acid (FOLVITE) 1 mg tablet  TAKE 1 TABLET BY MOUTH EVERY DAY     ?  topiramate (TOPAMAX) 200 mg tablet  Take 200 mg by mouth two (2) times a day.     ?  flaxseed oil (OMEGA 3 PO)  Take 1 Cap by mouth daily.     ?  POTASSIUM CHLORIDE PO  Take 10 mEq by mouth daily.     ?  linaCLOtide (LINZESS) 145 mcg cap capsule  Take 145 mcg by mouth daily as needed.     ?  hydrOXYchloroQUINE (PLAQUENIL) 200 mg tablet  Take 200 mg by mouth two (2) times a day.     ?  valACYclovir (VALTREX) 1 gram tablet  Take 500 mg by mouth every morning.     ?  DULoxetine (CYMBALTA) 60 mg capsule  Take 60 mg by mouth two (2) times a day.     ?  triamterene-hydrochlorothiazide (DYAZIDE) 37.5-25 mg per capsule  Take 1 Cap by mouth daily.     ?  ergocalciferol (ERGOCALCIFEROL) 1,250 mcg (50,000 unit) capsule  Take 50,000 Units by mouth every seven (7) days.     ?  azelastine (ASTELIN) 137 mcg (0.1 %) nasal spray  1 Spray by Nasal route two (2) times a day. Administer to right and left nostril.     ?  prednisoLONE acetate (PRED FORTE) 1 % ophthalmic suspension  Administer 1 Drop to right eye every morning.     ?  nortriptyline (PAMELOR) 25 mg capsule  Take 1 Capsule by mouth nightly. (Patient not taking: Reported on 10/23/2021)         ?  rizatriptan (MAXALT) 10 mg tablet  TAKE 1 TABLET (10 MG) BY ORAL ROUTE ONCE, MAY REPEAT AFTER 2  HOURS. NO MORE THAN 2/DAY (Patient not taking: Reported on 10/23/2021)          No current facility-administered medications for this visit.          Allergies         Allergen  Reactions         ?  Morphine  Other (comments)             "it does not stop the pain"         ?  Pcn [Penicillins]  Rash and Swelling           Review of Systems    Constitutional:  Positive for diaphoresis and malaise/fatigue . Negative for chills, fever and weight loss.    HENT:  Positive for hearing loss. Negative for sore throat.     Eyes:  Positive for blurred vision. Negative for discharge.    Respiratory:  Positive for shortness of breath. Negative for wheezing. Cough: sleep apnea .    Cardiovascular:  Negative for chest pain, palpitations, orthopnea, claudication and leg swelling.    Gastrointestinal:  Positive for constipation, diarrhea  and heartburn. Negative for abdominal pain, melena, nausea and vomiting.         Bloating    Genitourinary:  Positive for dysuria and frequency . Negative for flank pain and hematuria.    Musculoskeletal:  Positive for back pain, joint pain  and myalgias. Negative for neck pain.    Skin:  Negative for rash.    Neurological:  Positive for headaches. Negative for dizziness, speech change, focal weakness, seizures, loss of consciousness and weakness.    Endo/Heme/Allergies:  Bruises/bleeds easily.    Psychiatric/Behavioral:  Positive for depression. Negative for memory loss. The patient  is nervous/anxious.     Visit Vitals      BP  124/86 (BP 1 Location: Right upper arm, BP Patient Position: Sitting)     Pulse  80     Temp  97.3 ??F (36.3 ??C) (Temporal)     Resp  16     Ht  _0  (1.676 m)     Wt  101.6 kg (224 lb)     SpO2  95%        BMI  36.15 kg/m??           Physical Exam   Vitals and nursing note reviewed. Exam conducted with a chaperone present.    Constitutional:        General: She is not in acute distress.      Appearance: She is well-developed. She is not diaphoretic.    HENT:       Head: Normocephalic and atraumatic.       Nose: Nose normal.       Mouth/Throat:       Pharynx: Oropharynx is clear. No oropharyngeal exudate.    Eyes:       General: No  scleral icterus.      Conjunctiva/sclera: Conjunctivae normal.       Pupils: Pupils are equal, round, and reactive to light.    Neck:       Thyroid: No thyromegaly.       Vascular: No JVD.       Trachea: No tracheal deviation.    Cardiovascular:       Rate and Rhythm: Normal rate and regular rhythm.       Heart sounds: No murmur heard.     No friction rub. No gallop.    Pulmonary:  Effort: Pulmonary effort is normal. No respiratory distress.       Breath sounds: Normal breath sounds. No wheezing or rales.    Chest:            Abdominal:       General: Abdomen is flat. Bowel sounds are normal. There is no distension.       Palpations: Abdomen is soft. There is no mass.       Tenderness: There is no abdominal tenderness. There is no guarding or rebound.     Musculoskeletal:          General: Normal range of motion.       Cervical back: Normal range of motion and neck supple.     Lymphadenopathy:       Cervical: No cervical adenopathy.    Skin:      General: Skin is warm and dry.       Coloration: Skin is not pale.       Findings: No erythema or rash.       Comments: Right lower leg with healing wound.  Covered.   Managed by other MD    Neurological:       Mental Status: She is alert and oriented to person, place, and time.       Cranial Nerves: No cranial nerve deficit.    Psychiatric:          Behavior: Behavior normal.          Thought Content: Thought content normal.          Judgment: Judgment normal.          ASSESSMENT and PLAN   1.   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  I explained to her about the anatomy and pathophysiology of the  process and options for observation and excision.  Risks of excision include, but are not limited to, bleeding, infection, recurrence, poor healing/cosmesis, seroma.   2.  Lupus.  On prednisone.  Higher infection risk   3.  Tobacco abuse.   Strongly encouraged cessation   4.  Essential hypertension.  Stable on rx   5.  Sleep apnea   6.   Anxiety/depression. Stable on rx continue   7.  Chronic pain.  On gabapentin      She desires excision of a 3 cm right anterior chest wall mass under MAC as an outpatient      Luellen Pucker, MD FACS

## 2021-10-23 NOTE — Progress Notes (Signed)
 Identified pt with two pt identifiers(name and DOB). Reviewed record in preparation for visit and have obtained necessary documentation. All patient medications has been reviewed.    Chief Complaint   Patient presents with    Mass     Seen at the request of Dr MARLA Finder for evaluation of possible cyst to rib area/ lump under right breast.        Health Maintenance Due   Topic    COVID-19 Vaccine (1)    Pneumococcal 65+ years (1 - PCV)    Foot Exam Q1     Eye Exam Retinal or Dilated     Lipid Screen     Diabetic Alb to Cr ratio (uACR) test     DTaP/Tdap/Td series (1 - Tdap)    Shingles Vaccine (1 of 2)    Medicare Yearly Exam     Breast Cancer Screen Mammogram     A1C test (Diabetic or Prediabetic)     Flu Vaccine (1)    GFR test (Diabetes, CKD 3-4, OR last GFR 15-59)        Vitals:    10/23/21 1259   BP: 124/86   Pulse: 80   Resp: 16   Temp: 97.3 F (36.3 C)   TempSrc: Temporal   SpO2: 95%   Weight: 224 lb (101.6 kg)   Height: 5' 6 (1.676 m)   PainSc:   0 - No pain       4.Have you been to the ER, urgent care clinic since your last visit?  Hospitalized since your last visit?No    5. Have you seen or consulted any other health care providers outside of the [facility] since your last visit?  Include any pap smears or colon screening. No      Patient is accompanied by self I have received verbal consent from Katherine [name] to discuss any/all medical information while they are present in the room.

## 2021-11-04 NOTE — Interval H&P Note (Signed)
 Periop  Notes by Alexa Stephane SAUNDERS, RN at 11/04/21 1313                Author: Alexa Stephane SAUNDERS, RN  Service: SURGERY  Author Type: Registered Nurse       Filed: 11/04/21 1318  Date of Service: 11/04/21 1313  Status: Signed          Editor: Alexa Stephane SAUNDERS, RN (Registered Nurse)                    [facility]   Ambulatory Surgery Unit   Pre-operative Instructions      Surgery/Procedure Date  Monday 11/18/2021            Tentative Arrival Time TBD         1. On the day of your surgery/procedure, please report to the Ambulatory Surgery Unit Registration Desk and sign in at your designated time. The Ambulatory Surgery Unit is located in  [address] on the [place] side of the hospital across from the [facility]  building. Please have all of your health insurance cards, co-payment, and a photo ID.      **TWO adults may accompany you the day of the procedure.  We have limited seating available.  If our waiting room is at capacity, your ride may be asked to remain in their vehicle.  No one under 15  is allowed in the waiting room.        2. You must have someone with you to drive you home, as you should not drive a car for 24 hours following anesthesia. Please make arrangements for a responsible adult friend or family member to stay  with you for at least the first 24 hours after your surgery.      3. Do not have anything to eat or drink (including water , gum, mints, coffee, juice) after 11:59 PM on Sunday 3/26 . This may not apply to medications prescribed by your physician.  (Please note below  the special instructions with medications to take the morning of surgery, if applicable.)      4. We recommend you do not drink any alcoholic beverages for 24 hours before and after your surgery.      5. Contact your surgeons office for instructions on the following medications: non-steroidal anti-inflammatory drugs (i.e. Advil, Aleve), vitamins, and supplements. (Some surgeons will want you to stop  these medications prior to surgery  and others may allow you to take them)    **If you are currently taking Plavix, Coumadin, Aspirin  and/or other blood-thinning agents, contact your surgeon for instructions.** Your surgeon will partner with the physician prescribing these  medications to determine if it is safe to stop or if you need to continue taking. Please do not stop taking these medications without instructions from your surgeon.      6. In an effort to help prevent surgical site infection, we ask that you shower with an anti-bacterial soap (i.e. Dial/Safeguard, or the soap provided to you at your preadmission testing appointment)  for 3 days prior to and on the morning of surgery, using a fresh towel after each shower. (Please begin this process with fresh bed linens.) Do not apply any lotions, powders, or deodorants after the  shower on the day of your procedure. If applicable, please do not shave the operative site for 48 hours prior to surgery.       7. Wear comfortable clothes. Wear glasses instead of contacts. Do  not bring any jewelry or money (other than copays or fees as instructed). Do not wear make-up, particularly mascara, the morning of your surgery. Do not wear nail polish, particularly if  you are having foot /hand surgery. Wear your hair loose or down, no ponytails, buns, bobby pins or clips. All body piercings must be removed.        8. You should understand that if you do not follow these instructions your surgery may be cancelled. If your physical condition changes (i.e. fever, cold or flu) please contact your surgeon as soon as possible.      9. It is important that you be on time. If a situation occurs where you may be late, or if you have any questions or problems, please call [phone].      10. Your surgery time may be subject to change. You will receive a phone call the day prior to surgery to confirm your arrival time.      11. Pediatric patients: please bring a change of clothes,  diapers, bottle/sippy cup, pacifier, etc.         Special Instructions:      Take all medications and inhalers, as prescribed, on the morning of surgery with a sip of water  EXCEPT: n/a         Insulin Dependent Diabetic patients: Take your diabetic medications as prescribed the day before surgery.  Hold all diabetic medications the day of surgery.     If you are scheduled to arrive for surgery after 8:00 AM, and your AM blood sugar is >200, please call Ambulatory Surgery.      I understand a pre-operative phone call will be made to verify my surgery time.  In the event that I am not available, I give permission for a message to be left on my answering service and/or with another person?      Yes      Reviewed instructions via telephone, pt verbalized understanding             ___________________      ___________________      ________________   (Signature of Patient)          (Witness)                   (Date and Time)

## 2021-11-18 ENCOUNTER — Inpatient Hospital Stay: Payer: MEDICARE

## 2021-11-18 MED ORDER — MEPERIDINE (PF) 25 MG/ML INJ SOLUTION
25 mg/ml | INTRAMUSCULAR | Status: DC | PRN
Start: 2021-11-18 — End: 2021-11-18

## 2021-11-18 MED ORDER — ONDANSETRON (PF) 4 MG/2 ML INJECTION
4 mg/2 mL | INTRAMUSCULAR | Status: DC | PRN
Start: 2021-11-18 — End: 2021-11-18
  Administered 2021-11-18: 19:00:00 via INTRAVENOUS

## 2021-11-18 MED ORDER — FENTANYL CITRATE (PF) 50 MCG/ML IJ SOLN
50 mcg/mL | INTRAMUSCULAR | Status: DC | PRN
Start: 2021-11-18 — End: 2021-11-18

## 2021-11-18 MED ORDER — OXYCODONE-ACETAMINOPHEN 5 MG-325 MG TAB
5-325 mg | Freq: Once | ORAL | Status: DC | PRN
Start: 2021-11-18 — End: 2021-11-18

## 2021-11-18 MED ORDER — LACTATED RINGERS IV
INTRAVENOUS | Status: DC
Start: 2021-11-18 — End: 2021-11-18

## 2021-11-18 MED ORDER — SODIUM CHLORIDE 0.9 % IJ SYRG
Freq: Three times a day (TID) | INTRAMUSCULAR | Status: DC
Start: 2021-11-18 — End: 2021-11-18

## 2021-11-18 MED ORDER — MIDAZOLAM 1 MG/ML IJ SOLN
1 mg/mL | INTRAMUSCULAR | Status: DC | PRN
Start: 2021-11-18 — End: 2021-11-18
  Administered 2021-11-18: 18:00:00 via INTRAVENOUS

## 2021-11-18 MED ORDER — FENTANYL CITRATE (PF) 50 MCG/ML IJ SOLN
50 mcg/mL | INTRAMUSCULAR | Status: DC | PRN
Start: 2021-11-18 — End: 2021-11-18
  Administered 2021-11-18 (×3): via INTRAVENOUS

## 2021-11-18 MED ORDER — PROPOFOL 10 MG/ML IV EMUL
10 mg/mL | INTRAVENOUS | Status: AC
Start: 2021-11-18 — End: ?

## 2021-11-18 MED ORDER — LACTATED RINGERS IV
INTRAVENOUS | Status: DC | PRN
Start: 2021-11-18 — End: 2021-11-18
  Administered 2021-11-18: 18:00:00 via INTRAVENOUS

## 2021-11-18 MED ORDER — IBUPROFEN 800 MG TAB
800 mg | ORAL_TABLET | Freq: Three times a day (TID) | ORAL | 0 refills | Status: AC | PRN
Start: 2021-11-18 — End: 2021-11-28

## 2021-11-18 MED ORDER — SODIUM CHLORIDE 0.9 % IJ SYRG
INTRAMUSCULAR | Status: DC | PRN
Start: 2021-11-18 — End: 2021-11-18

## 2021-11-18 MED ORDER — PROPOFOL 10 MG/ML IV EMUL
10 mg/mL | INTRAVENOUS | Status: DC | PRN
Start: 2021-11-18 — End: 2021-11-18
  Administered 2021-11-18: 18:00:00 via INTRAVENOUS

## 2021-11-18 MED ORDER — BUPIVACAINE-EPINEPHRINE (PF) 0.5 %-1:200,000 IJ SOLN
0.5 %-1:200,000 | INTRAMUSCULAR | Status: DC | PRN
Start: 2021-11-18 — End: 2021-11-18
  Administered 2021-11-18: 19:00:00 via SUBCUTANEOUS

## 2021-11-18 MED ORDER — MIDAZOLAM 1 MG/ML IJ SOLN
1 mg/mL | INTRAMUSCULAR | Status: AC
Start: 2021-11-18 — End: ?

## 2021-11-18 MED ORDER — LIDOCAINE (PF) 10 MG/ML (1 %) IJ SOLN
10 mg/mL (1 %) | INTRAMUSCULAR | Status: DC | PRN
Start: 2021-11-18 — End: 2021-11-18

## 2021-11-18 MED ORDER — LIDOCAINE (PF) 20 MG/ML (2 %) IJ SOLN
20 mg/mL (2 %) | INTRAMUSCULAR | Status: DC | PRN
Start: 2021-11-18 — End: 2021-11-18
  Administered 2021-11-18: 18:00:00 via INTRAVENOUS

## 2021-11-18 MED ORDER — LACTATED RINGERS IV
INTRAVENOUS | Status: DC
Start: 2021-11-18 — End: 2021-11-18
  Administered 2021-11-18: 18:00:00 via INTRAVENOUS

## 2021-11-18 MED ORDER — ONDANSETRON (PF) 4 MG/2 ML INJECTION
4 mg/2 mL | INTRAMUSCULAR | Status: DC | PRN
Start: 2021-11-18 — End: 2021-11-18

## 2021-11-18 MED ORDER — FENTANYL CITRATE (PF) 50 MCG/ML IJ SOLN
50 mcg/mL | INTRAMUSCULAR | Status: AC
Start: 2021-11-18 — End: ?

## 2021-11-18 MED ORDER — OXYCODONE-ACETAMINOPHEN 5 MG-325 MG TAB
5-325 mg | ORAL_TABLET | Freq: Four times a day (QID) | ORAL | 0 refills | Status: AC | PRN
Start: 2021-11-18 — End: 2021-11-21

## 2021-11-18 MED ORDER — BUPIVACAINE-EPINEPHRINE (PF) 0.5 %-1:200,000 IJ SOLN
0.5 %-1:200,000 | INTRAMUSCULAR | Status: AC
Start: 2021-11-18 — End: ?

## 2021-11-18 MED ORDER — LIDOCAINE (PF) 20 MG/ML (2 %) IJ SOLN
20 mg/mL (2 %) | INTRAMUSCULAR | Status: AC
Start: 2021-11-18 — End: ?

## 2021-11-18 MED ORDER — DIPHENHYDRAMINE HCL 50 MG/ML IJ SOLN
50 mg/mL | INTRAMUSCULAR | Status: DC | PRN
Start: 2021-11-18 — End: 2021-11-18

## 2021-11-18 MED ORDER — CEFAZOLIN 1 GRAM SOLUTION FOR INJECTION
1 gram | Freq: Once | INTRAMUSCULAR | Status: AC
Start: 2021-11-18 — End: 2021-11-18
  Administered 2021-11-18: 18:00:00 via INTRAVENOUS

## 2021-11-18 MED ORDER — PROPOFOL 10 MG/ML IV EMUL
10 mg/mL | INTRAVENOUS | Status: DC | PRN
Start: 2021-11-18 — End: 2021-11-18
  Administered 2021-11-18 (×2): via INTRAVENOUS

## 2021-11-18 MED FILL — SENSORCAINE-MPF/EPINEPHRINE 0.5 %-1:200,000 INJECTION SOLUTION: 0.5 %-1:200,000 | INTRAMUSCULAR | Qty: 30

## 2021-11-18 MED FILL — FENTANYL CITRATE (PF) 50 MCG/ML IJ SOLN: 50 mcg/mL | INTRAMUSCULAR | Qty: 2

## 2021-11-18 MED FILL — BD POSIFLUSH NORMAL SALINE 0.9 % INJECTION SYRINGE: INTRAMUSCULAR | Qty: 40

## 2021-11-18 MED FILL — LIDOCAINE (PF) 20 MG/ML (2 %) IJ SOLN: 20 mg/mL (2 %) | INTRAMUSCULAR | Qty: 5

## 2021-11-18 MED FILL — LACTATED RINGERS IV: INTRAVENOUS | Qty: 1000

## 2021-11-18 MED FILL — CEFAZOLIN 1 GRAM SOLUTION FOR INJECTION: 1 gram | INTRAMUSCULAR | Qty: 2000

## 2021-11-18 MED FILL — MIDAZOLAM 1 MG/ML IJ SOLN: 1 mg/mL | INTRAMUSCULAR | Qty: 2

## 2021-11-18 MED FILL — PROPOFOL 10 MG/ML IV EMUL: 10 mg/mL | INTRAVENOUS | Qty: 50

## 2021-11-18 NOTE — Interval H&P Note (Signed)
 Permission received to review discharge instructions and discuss private health information with fiance, Tanda.      Patient states that family/friend will be with them for at least 24 hours following today's procedure.     Mistral-Air warming blanket applied at this time. Set to appropriate setting that is comfortable to patient. Will continue to monitor.

## 2021-11-18 NOTE — Anesthesia Post-Procedure Evaluation (Signed)
Formatting of this note is different from the original.    Procedure(s):  EXCISION 3CM RIGHT CHEST WALL MASS.    MAC, general - backup    Anesthesia Post Evaluation    Multimodal analgesia: multimodal analgesia used between 6 hours prior to anesthesia start to PACU discharge  Patient location during evaluation: PACU  Patient participation: complete - patient participated  Level of consciousness: awake and alert  Pain management: adequate  Airway patency: patent  Anesthetic complications: no  Cardiovascular status: acceptable, hemodynamically stable and blood pressure returned to baseline  Respiratory status: acceptable and room air  Hydration status: euvolemic  Post anesthesia nausea and vomiting:  none  Final Post Anesthesia Temperature Assessment:  Normothermia (36.0-37.5 degrees C)    INITIAL Post-op Vital signs:   Vitals Value Taken Time   BP 122/74 11/18/21 1516   Temp 36.3 C (97.3 F) 11/18/21 1500   Pulse 82 11/18/21 1529   Resp 13 11/18/21 1529   SpO2 99 % 11/18/21 1529   Vitals shown include unvalidated device data.  Electronically signed by Junie Panning, MD at 11/18/2021  3:44 PM EDT

## 2021-11-18 NOTE — Anesthesia Pre-Procedure Evaluation (Signed)
Formatting of this note is different from the original.  Anesthetic History         Comments: Has h/o feeling SOB post-op    Review of Systems / Medical History  Patient summary reviewed, nursing notes reviewed and pertinent labs reviewed    Pulmonary    COPD    Sleep apnea: No treatment  Smoker (1/2 ppd)     Neuro/Psych     Headaches and psychiatric history (depression)    Comments: ADHD  Tremors  peripheral neuropathy  Hx sciatica Cardiovascular  Within defined limits    Exercise tolerance: >4 METS    GI/Hepatic/Renal    GERD    Comments: Diverticulosis  Endo/Other    Diabetes    Obesity and arthritis     Other Findings   Comments: Lupus, Menieres &, Fibromyalgia   post-laminectomy syndrome  Visual loss    Hearing loss     Other pain disorders related to psychological factors            Physical Exam    Airway  Mallampati: II  TM Distance: 4 - 6 cm  Neck ROM: normal range of motion   Mouth opening: Normal     Cardiovascular    Rhythm: regular  Rate: normal     Dental  No notable dental hx      Pulmonary  Breath sounds clear to auscultation     Abdominal  GI exam deferred     Other Findings       Anesthetic Plan    ASA: 3  Anesthesia type: MAC and general - backup    Induction: Intravenous  Anesthetic plan and risks discussed with: Patient          Electronically signed by Brunilda Payor, MD at 11/18/2021  2:15 PM EDT

## 2021-11-18 NOTE — Interval H&P Note (Signed)
 1459-Pt. To pacu, sleepy but arousable.  Vss.  Denies pain.  Incision site intact.    1525-Discharge instructions reviewed w/pt. And fiance.  They verbalized understanding.    1555-Pt. States ready for discharge.  Vss.  Denies pain.  Incision intact.  Pt discharged via wheelchair to car, accompanied by RN.  Pt discharged awake and alert, respirations equal and unlabored, skin warm, dry, and intact.  Pt and family members' questions and concerns addressed prior to discharge.

## 2021-11-18 NOTE — Interval H&P Note (Signed)
 Katherine [name]  December 08, 1955  [id number]    Situation:  Verbal report given from: Gerard Deal, CRNA, Almeda Almas, RN  Procedure: Procedure(s):  EXCISION 3CM RIGHT CHEST WALL MASS    Background:    Preoperative diagnosis: RIGHT CHEST WALL MASS    Postoperative diagnosis: RIGHT CHEST WALL MASS    Operator:  Dr. Lucy    Assistant(s): Circ-1: Almas Almeda, RN  Scrub Tech-1: Ramonita Search  Surg Asst-1: Margo Anes    Specimens:   ID Type Source Tests Collected by Time Destination   1 : Right Chest Mass Preservative Chest  Lucy Ozell PARAS, MD 11/18/2021 1444 Pathology       Assessment:  Intra-procedure medications         Anesthesia gave intra-procedure sedation and medications, see anesthesia flow sheet     Intravenous fluids: LR@ KVO     Vital signs stable       Recommendation:    Permission to share finding with fiance : yes

## 2021-12-09 ENCOUNTER — Ambulatory Visit: Admit: 2021-12-09 | Discharge: 2021-12-09 | Payer: MEDICARE | Attending: Surgery | Primary: Family Medicine

## 2021-12-09 ENCOUNTER — Ambulatory Visit: Attending: Surgery | Primary: Family Medicine

## 2021-12-09 DIAGNOSIS — Z09 Encounter for follow-up examination after completed treatment for conditions other than malignant neoplasm: Secondary | ICD-10-CM

## 2021-12-09 NOTE — Progress Notes (Signed)
Identified pt with two pt identifiers (name and DOB). Reviewed chart in preparation for visit and have obtained necessary documentation.    Katherine [name] is a 66 y.o. female  Chief Complaint   Patient presents with    Surgical Follow-up     S/p EXCISION 3CM RIGHT CHEST WALL MASS on 11/18/21     Visit Vitals  BP 115/62 (BP 1 Location: Left upper arm, BP Patient Position: Sitting, BP Cuff Size: Small adult)   Pulse (!) 103   Temp 97.5 F (36.4 C) (Temporal)   Resp 18   Ht '5\' 6"'$  (1.676 m)   Wt 101.1 kg (222 lb 12.8 oz)   SpO2 96%   BMI 35.96 kg/m       1. Have you been to the ER, urgent care clinic since your last visit?  Hospitalized since your last visit?No    2. Have you seen or consulted any other health care providers outside of the [place] since your last visit?  Include any pap smears or colon screening. No    Pt's pulse elevated.

## 2021-12-09 NOTE — Progress Notes (Signed)
Surgery  Follow up  Procedure: Excision of 5 cm right chest wall mass.  OR date:  11/18/2021  Path:    Soft tissue, right chest mass, excision:        Lipoma     S I feel fine now but had pain last week, no drainage    Visit Vitals  BP 115/62 (BP 1 Location: Left upper arm, BP Patient Position: Sitting, BP Cuff Size: Small adult)   Pulse (!) 103   Temp 97.5 F (36.4 C) (Temporal)   Resp 18   Ht '5\' 6"'$  (1.676 m)   Wt 101.1 kg (222 lb 12.8 oz)   SpO2 96%   BMI 35.96 kg/m       O Incisions healing well without infection       A/P Doing well   RTC prn    Luellen Pucker, MD FACS

## 2022-01-17 ENCOUNTER — Encounter

## 2022-01-23 ENCOUNTER — Inpatient Hospital Stay: Payer: MEDICARE | Primary: Family Medicine

## 2022-01-24 ENCOUNTER — Encounter

## 2022-01-28 ENCOUNTER — Inpatient Hospital Stay: Admit: 2022-01-28 | Payer: MEDICARE | Primary: Family Medicine

## 2022-01-28 DIAGNOSIS — M5136 Other intervertebral disc degeneration, lumbar region: Secondary | ICD-10-CM

## 2022-02-04 ENCOUNTER — Ambulatory Visit: Payer: MEDICARE | Primary: Family Medicine

## 2022-03-31 ENCOUNTER — Encounter

## 2022-04-14 ENCOUNTER — Ambulatory Visit: Payer: MEDICARE | Primary: Family Medicine

## 2022-05-05 NOTE — Telephone Encounter (Signed)
Formatting of this note might be different from the original.  I returned the patient's call and LM   Electronically signed by Paula Compton, [place] at 05/05/2022  3:34 PM EDT

## 2022-05-12 ENCOUNTER — Inpatient Hospital Stay: Admit: 2022-05-12 | Payer: MEDICARE | Attending: Orthopaedic Surgery | Primary: Family Medicine

## 2022-05-12 DIAGNOSIS — M545 Low back pain, unspecified: Secondary | ICD-10-CM

## 2022-05-12 NOTE — Telephone Encounter (Signed)
Called patient regarding appt needed via my chart. Offered to get her in but she just wanted to talk to [facility] with Dr. Gillian Scarce and not Reina Fuse.  I advised her to call Dr. Deno Etienne office.

## 2022-06-19 NOTE — Progress Notes (Signed)
Formatting of this note is different from the original.  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)    Patient Active Problem List   Diagnosis    ADHD (attention deficit hyperactivity disorder), inattentive type    Adjustment disorder with mixed anxiety and depressed mood    Burning sensation of feet    Carpal tunnel syndrome of left wrist    Cervical radiculopathy due to degenerative joint disease of spine    Chronic head pain    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    Fibromyalgia    Low back pain    Lumbar radiculopathy    Lupus    Osteoarthritis of right knee    Other pain disorders related to psychological factors    Other persistent mental disorders due to conditions classified elsewhere    Palpitations    S/P total knee replacement, right    Tremors of nervous system    Ulnar neuropathy at elbow of right upper extremity    B12 deficiency    Benign essential tremor syndrome    Bilateral carotid artery stenosis    Convulsions    Diabetic peripheral neuropathy    Disturbance of memory    LLQ pain    Lumbar post-laminectomy syndrome    Syncope and collapse    Vitamin D deficiency    Limb pain    Radiculopathy, lumbar region    Frequent falls    Right foot drop    Left foot drop     Pain classified as: Mechanical back pain, Radicular pain, and Claudication    Impression: L3-L5 spondylolisthesis with foraminal stenosis on the left. Mild stenosis L3-L5; low back pain with LLE sciatica. Gait difficulties     PLAN:  Our practice treats spine problems with a comprehensive, multi-modal approach involving rehab, non-narcotic medications, injections and surgery (if  clinically indicated).  Today we discussed the diagnosis and engaged in the shared decision-making process regarding testing, treatment options, along with risks and benefits.  Katherine [name] (and present family/friends) was/were an active participant on this conversation and had all questions satisfactorily answered. The agreed upon treatment plan after today?s visit is as follows:    Rehab: N/A    Medications: N/A    Additional workup: N/A    Interventional procedures: N/A    Surgical options: L3-L5 posterior decompression and fusion with TLIF from the left     Work status: N/A    At this time, Katherine [name] would like to proceed with surgical intervention. We discussed the operation in detail and answered her questions.     I have discussed the procedure in detail with the patient and mentioned complications, including but not limited to: death, permanent disability, heart attack, stroke, lung injury or infection, blindness, ileus, bladder or bowel problems, ureter injury, bleeding, nerve injury (including numbness, pain and weakness), paralysis (which may be permanent), failure to heal, failure to fuse bone together in fusion procedures, failure to relief symptoms, failure to relief pain, increased pain, need for further surgeries, failure or breakage or hardware, malpositioning of hardware, need to fuse or operate on additional levels determined either during or after surgery, destabilization of the spine (which may require fusion or later surgery), infections (  which may or may not require additional surgery), dural tears (tears of the sac holding in nerves and spinal fluid), meningitis, voice changes, blood clots, pulmonary embolus, recurrent disc herniation, diaphragm paralysis, and anesthetic complications. Comorbidities such as obesity, smoking, rheumatoid arthritis, chronic steroid use and diabetes increase these risks. The patient understands and wants to proceed.     The patient has been prescribed a  LSO spinal orthosis for pain relief. The orthosis is medically necessary to reduce pain by restricting mobility of the trunk and to otherwise support weak spinal muscles and/or deformed spine.  The patient will meet with our bracing coordinator to be fit for the brace.     Treatment Plan:   Orders Placed This Encounter    Surgical Posting Sheet    X-ray lumbar spine complete 4+ views ([id number])    BP Patient Education       Follow-up: Return for Follow up 2-3 weeks after surgery.     HISTORY OF PRESENT ILLNESS:  Katherine [name]; [id number]   Age: 66 y.o. Sex: female   Pain score: Pain rating = 7  out of 10     Chief Complaint: Pain of the Lower Back    History of present illness:  Katherine [name] is a 66 y.o. female with complaints of  low back pain radiating down the left lateral hip, lateral thigh and lateral shin into the lateral toes to the 3rd toe. Her symptoms are worse in the morning and with walking, standing, bending and specific movement. She admits to relief with using a shopping cart.  Katherine [name] has tried physical therapy, home exercise program, medication, and activity modification.  Symptoms are unchanged since last visit .  The pain is rated 7 out of 10 on the VAS.    Katherine [name] has functioning limiting radicular pain (limiting ADLs): Yes.    HPI     Patient Active Problem List    Diagnosis Date Noted    Frequent falls 03/27/2022    Right foot drop 03/27/2022    Left foot drop 03/27/2022    B12 deficiency 12/08/2019    Benign essential tremor syndrome 12/08/2019    Bilateral carotid artery stenosis 12/08/2019    Convulsions 12/08/2019    Diabetic peripheral neuropathy 12/08/2019    Disturbance of memory 12/08/2019    Syncope and collapse 12/08/2019    Vitamin D deficiency 12/08/2019    LLQ pain 06/28/2019    Osteoarthritis of right knee 05/04/2017    S/P total knee replacement, right 05/04/2017    Carpal tunnel syndrome of left wrist 03/25/2017    Cervical radiculopathy due to  degenerative joint disease of spine 03/25/2017    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 03/25/2017    Low back pain 03/25/2017    Ulnar neuropathy at elbow of right upper extremity 03/25/2017    Radiculopathy, lumbar region 03/25/2017    ADHD (attention deficit hyperactivity disorder), inattentive type 04/03/2014    Chronic head pain 04/03/2014    Tremors of nervous system 04/03/2014    Adjustment disorder with mixed anxiety and depressed mood 03/10/2013    Other pain disorders related to psychological factors 03/10/2013    Other persistent mental disorders due to conditions classified elsewhere 03/10/2013    Fibromyalgia 02/16/2013    Lumbar radiculopathy 02/16/2013    Lumbar post-laminectomy syndrome 02/16/2013    Limb pain 02/16/2013    Burning sensation of feet 06/16/2012    Lupus 09/22/2010    Palpitations  09/22/2010     Family History   Problem Relation Age of Onset    Diabetes Father     Diabetes Sister     No Known Problems Mother     No Known Problems Brother     No Known Problems Son     No Known Problems Daughter     No Known Problems Other     Coronary artery disease Neg Hx     Clotting disorder Neg Hx     Anesthesia problems Neg Hx        Social History     Tobacco Use    Smoking status: Former     Packs/day: 0     Types: Cigarettes     Passive exposure: Past    Smokeless tobacco: Never   Substance Use Topics    Alcohol use: Yes     Comment: Once or twice a year     Past Medical History:   Diagnosis Date    Autoimmune disease     Diabetic peripheral neuropathy 12/08/2019    Fibromyalgia, primary     GERD (gastroesophageal reflux disease)     Inflammatory bowel disease     Sleep apnea        Past Surgical History:   Procedure Laterality Date    KNEE SURGERY      ~10 years ago Right knee scope    NO RELEVANT SURGERIES      SPINE SURGERY         Current Outpatient Medications:     Anifrolumab-fnia (Saphnelo) 300 MG/2ML solution, Infuse 300 mg into a venous catheter, Disp: , Rfl:     ascorbic  acid (VITAMIN C) 100 MG tablet, Take 1 tablet by mouth., Disp: , Rfl:     azelastine (ASTELIN) 0.1 % nasal spray, 1 Spray by Nasal route two (2) times a day. Administer to right and left nostril., Disp: , Rfl:     busPIRone (BUSPAR) 10 MG tablet, TAKE 1 TABLET (10 MG) BY ORAL ROUTE 3 TIMES PER DAY FOR 30 DAYS, Disp: , Rfl:     butalbital-acetaminophen-caffeine (FIORICET, ESGIC) 50-325-40 MG per tablet, Take 2 tablets by mouth., Disp: , Rfl:     cefuroxime (CEFTIN) 500 MG tablet, cefuroxime axetil 500 mg tablet, Disp: , Rfl:     cephalexin (KEFLEX) 500 MG capsule, Take 500 mg by mouth 4 (four) times a day, Disp: , Rfl:     cimetidine (TAGAMET) 400 MG tablet, TAKE 1 TABLET BY MOUTH 2 TIMES PER DAY IN THE MORNING AND AT BEDTIME FOR 90 DAYS, NOT COVERED, Disp: , Rfl:     cyclobenzaprine (FLEXERIL) 10 MG tablet, , Disp: , Rfl:     Dexilant 60 MG capsule, , Disp: , Rfl:     DULoxetine (CYMBALTA) 60 MG capsule, Take 60 mg by mouth., Disp: , Rfl:     esomeprazole (NexIUM) 40 MG capsule, Take 40 mg by mouth daily, Disp: , Rfl: 2    fluconazole (DIFLUCAN) 50 MG tablet, Take 50 mg by mouth daily, Disp: , Rfl:     Fluzone High-Dose Quadrivalent 0.7 ML suspension prefilled syringe, ADM 0.7ML IM UTD, Disp: , Rfl:     folic acid (FOLVITE) 1 MG tablet, Take 1,000 mcg by mouth once daily, Disp: , Rfl:     furosemide (LASIX) 20 MG tablet, , Disp: , Rfl:     gabapentin (NEURONTIN) 400 MG capsule, Take 400 mg by mouth 3 (three) times a day, Disp: , Rfl:  HYDROcodone-acetaminophen (LORCET PLUS) 10-325 MG per tablet, Take 1 tablet by mouth every 6 (six) hours as needed, Disp: , Rfl: 0    hydrocortisone (ANUSOL-HC) 25 MG suppository, Insert 25 mg into the rectum 3 times daily, Disp: , Rfl:     hydroxychloroquine (PLAQUENIL) 200 MG tablet, Take 200 mg by mouth daily, Disp: , Rfl:     ibuprofen (ADVIL,MOTRIN) 800 MG tablet, TAKE 1 TABLET BY MOUTH EVERY 8 HOURS FOR UP TO 10 DAYS AS NEEDED FOR PAIN, Disp: , Rfl:     linaCLOtide 145 MCG  capsule, Take 145 mcg by mouth nightly as needed, Disp: , Rfl:     meloxicam (MOBIC) 15 MG tablet, TAKE 1 TABLET BY MOUTH EVERY DAY WITH FOOD, Disp: 30 tablet, Rfl: 3    methotrexate 2.5 MG tablet, , Disp: , Rfl:     Multiple Vitamins-Minerals (MULTIVITAMIN & MINERAL PO), Take 1 tablet by mouth., Disp: , Rfl:     Naloxone HCl 4 MG/0.1ML liquid, Administer 1 spray into affected nostril(s) as needed, Disp: , Rfl:     nortriptyline (PAMELOR) 50 MG capsule, Take 50 mg by mouth daily., Disp: , Rfl:     Omega-3 Fatty Acids (FISH OIL PO), Take 1 capsule by mouth., Disp: , Rfl:     oxyCODONE-acetaminophen (PERCOCET) 5-325 MG per tablet, , Disp: , Rfl:     Potassium Chloride (KLOR-CON 10 PO), Take 10 mEq by mouth., Disp: , Rfl:     potassium chloride (KLOR-CON) 10 MEQ CR tablet, , Disp: , Rfl:     prednisoLONE acetate (PRED FORTE) 1 % ophthalmic suspension, Administer 1 drop into the right eye 2 (two) times a day as needed, Disp: , Rfl:     primidone (MYSOLINE) 50 MG tablet, , Disp: , Rfl:     rizatriptan (MAXALT) 10 MG tablet, , Disp: , Rfl:     sucralfate (CARAFATE) 1 g tablet, , Disp: , Rfl:     tiZANidine (ZANAFLEX) 4 MG tablet, Take 4 mg by mouth daily, Disp: , Rfl:     topiramate (TOPAMAX) 200 MG tablet, Take 200 mg by mouth daily, Disp: , Rfl:     traZODone (DESYREL) 50 MG tablet, Take 50 mg by mouth as needed, Disp: , Rfl: 5    triamterene-hydrochlorothiazide (MAXZIDE-25) 37.5-25 MG per tablet, , Disp: , Rfl:     Vitamin D, Ergocalciferol, 50000 units capsule, Take 50,000 Units by mouth once a week., Disp: , Rfl:     vitamin E 1000 units capsule, Take 1,000 Units by mouth daily, Disp: , Rfl:     Allergies   Allergen Reactions    Morphine      Other reaction(s): Other (comments)  "it does not stop the pain"    Other      Other reaction(s): Other (comments)  Pt must have low sodium fluid because it will cause an attack of the mineres    Morphine And Related Rash    Penicillins Rash and Swelling     Other reaction(s):  Rash, Swelling       ROS:   No new bowel or bladder incontinence.  No fever.  No saddle anesthesia.    OBJECTIVE:  BP (!) 144/77 (BP Location: Left arm, Patient Position: Sitting)   Pulse (!) 96   Ht '5\' 6"'$    Wt 220 lb   BMI 35.51 kg/m     Body mass index is 35.51 kg/m., a BMI over 30 is considered obese and a BMI over 40 has  been associated with a higher risk of surgical complications.    Musculoskeletal/Neurological:   - Ambulates with a cane.  No physical exam completed today.     RESULTS REVIEWED:   Order: XR SPINE LUMBAR COMPLETE 4+ VW - Indication: Lumbar pain      X-ray lumbar spine complete 4+ views ([id number])    Result Date: 06/19/2022  Standing. AP, Flexion, Lat, Extension.     Impression: X-rays of the lumbar spine show degenerative changes throughout. Mild scoliosis. Spondylolisthesis L3-L4 L4-L5. Bilateral hip osteoarthritis.       This note has been transcribed electronically using voice recognition and a trained scribe.  It is believed to be accurate, but may contain errors secondary to technological limitations and other factors.    ILanae Crumbly, MD, personally, performed the services described in this documentation, as scribed in my presence, and it is both accurate and complete.  Scribed by: Leighton Parody     Electronically signed by Lanae Crumbly, MD at 06/19/2022  5:12 PM EDT

## 2022-08-22 ENCOUNTER — Inpatient Hospital Stay: Admit: 2022-08-22 | Discharge: 2022-08-26 | Payer: MEDICARE | Primary: Family Medicine

## 2022-08-22 ENCOUNTER — Encounter: Payer: MEDICARE | Primary: Family Medicine

## 2022-08-22 ENCOUNTER — Inpatient Hospital Stay: Admit: 2022-08-22 | Payer: MEDICARE | Primary: Family Medicine

## 2022-08-22 DIAGNOSIS — Z01818 Encounter for other preprocedural examination: Secondary | ICD-10-CM

## 2022-08-22 LAB — HEMOGLOBIN A1C
Hemoglobin A1C: 5 % (ref 4.0–5.6)
eAG: 97 mg/dL

## 2022-08-22 LAB — COMPREHENSIVE METABOLIC PANEL
ALT: 23 U/L (ref 12–78)
AST: 45 U/L — ABNORMAL HIGH (ref 15–37)
Albumin/Globulin Ratio: 1 — ABNORMAL LOW (ref 1.1–2.2)
Albumin: 4 g/dL (ref 3.5–5.0)
Alk Phosphatase: 79 U/L (ref 45–117)
Anion Gap: 6 mmol/L (ref 5–15)
BUN: 13 MG/DL (ref 6–20)
Bun/Cre Ratio: 14 (ref 12–20)
CO2: 30 mmol/L (ref 21–32)
Calcium: 9.9 MG/DL (ref 8.5–10.1)
Chloride: 104 mmol/L (ref 97–108)
Creatinine: 0.9 MG/DL (ref 0.55–1.02)
Est, Glom Filt Rate: >60 mL/min/{1.73_m2} (ref >60)
Globulin: 4 g/dL (ref 2.0–4.0)
Glucose: 99 mg/dL (ref 65–100)
Potassium: 3.7 mmol/L (ref 3.5–5.1)
Sodium: 140 mmol/L (ref 136–145)
Total Bilirubin: 0.4 MG/DL (ref 0.2–1.0)
Total Protein: 8 g/dL (ref 6.4–8.2)

## 2022-08-22 LAB — URINALYSIS WITH REFLEX TO CULTURE
BACTERIA, URINE: NEGATIVE /hpf
Bilirubin Urine: NEGATIVE
Blood, Urine: NEGATIVE
Glucose, UA: NEGATIVE mg/dL
Ketones, Urine: NEGATIVE mg/dL
Nitrite, Urine: NEGATIVE
Protein, UA: NEGATIVE mg/dL
Specific Gravity, UA: 1.01
Urobilinogen, Urine: 1 EU/dL (ref 0.2–1.0)
pH, Urine: 7 (ref 5.0–8.0)

## 2022-08-22 LAB — CBC
Hematocrit: 44 % (ref 35.0–47.0)
Hemoglobin: 14.5 g/dL (ref 11.5–16.0)
MCH: 32.6 PG (ref 26.0–34.0)
MCHC: 33 g/dL (ref 30.0–36.5)
MCV: 98.9 FL (ref 80.0–99.0)
MPV: 8.8 FL — ABNORMAL LOW (ref 8.9–12.9)
Nucleated RBCs: 0 PER 100 WBC
Platelets: 240 10*3/uL (ref 150–400)
RBC: 4.45 M/uL (ref 3.80–5.20)
RDW: 14.1 % (ref 11.5–14.5)
WBC: 4.8 10*3/uL (ref 3.6–11.0)
nRBC: 0 10*3/uL (ref 0.00–0.01)

## 2022-08-22 LAB — PROTIME-INR
INR: 1 (ref 0.9–1.1)
Protime: 10.1 s (ref 9.0–11.1)

## 2022-08-22 LAB — URINE DRUG SCREEN
Amphetamine, Urine: NEGATIVE
Barbiturates, Urine: NEGATIVE
Benzodiazepines, Urine: NEGATIVE
Cocaine, Urine: NEGATIVE
Methadone, Urine: NEGATIVE
Opiates, Urine: POSITIVE — AB
PCP, Urine: NEGATIVE
THC, TH-Cannabinol, Urine: NEGATIVE

## 2022-08-22 LAB — VITAMIN D 25 HYDROXY: Vit D, 25-Hydroxy: 18.3 ng/mL — ABNORMAL LOW (ref 30–100)

## 2022-08-22 LAB — TYPE AND SCREEN
ABO/Rh: A POS
Antibody Screen: NEGATIVE

## 2022-08-22 NOTE — Other (Addendum)
[facility]  Joint/Spine Preoperative Instructions        Surgery Date 09/02/22          Time of Arrival to be called at [phone] or [phone]     1. On the day of your surgery, please report to the Surgical Services Registration Desk and sign in at your designated time. The Surgery Center is located to the right of the Emergency Room.     2. You must have someone with you to drive you home. You should not drive a car for 24 hours following surgery. Please make arrangements for a friend or family member to stay with you for the first 24 hours after your surgery.    3. No food after midnight.  Medications morning of surgery should be taken with a sip of water.  Please follow pre-surgery drink instructions that were given at your Pre Admission Testing appointment.      4. We recommend you do not drink any alcoholic beverages for 24 hours before and after your surgery.    5. Contact your surgeon's office for instructions on the following medications: non-steroidal anti-inflammatory drugs (i.e. Advil, Aleve), vitamins, and supplements. (Some surgeon's will want you to stop these medications prior to surgery and others may allow you to take them)  **If you are currently taking Plavix, Coumadin, Aspirin and/or other blood-thinning agents, contact your surgeon for instructions.** Your surgeon will partner with the physician prescribing these medications to determine if it is safe to stop or if you need to continue taking.  Please do not stop taking these medications without instructions from your surgeon    6. Wear comfortable clothes.  Wear glasses instead of contacts.  Do not bring any money or jewelry. Please bring picture ID, insurance card, and any prearranged co-payment or hospital payment.  Do not wear make-up, particularly mascara the morning of your surgery.  Do not wear nail polish, particularly if you are having foot /hand surgery.  Wear your hair loose or down, no  ponytails, buns, bobby pins or clips.  All body piercings must be removed.  Please shower with antibacterial soap for three consecutive days before and on the morning of surgery, but do not apply any lotions, powders or deodorants after the shower on the day of surgery. Please use a fresh towels after each shower. Please sleep in clean clothes and change bed linens the night before surgery.  Please do not shave for 48 hours prior to surgery. Shaving of the face is acceptable.    7. You should understand that if you do not follow these instructions your surgery may be cancelled.  If your physical condition changes (I.e. fever, cold or flu) please contact your surgeon as soon as possible.    8. It is important that you be on time.  If a situation occurs where you may be late, please call [phone] (OR Holding Area).    9. If you have any questions and or problems, please call [phone] (Pre-admission Testing).    10. Your surgery time may be subject to change.  You will receive a phone call the evening prior with your time of arrival.    11.  If having outpatient surgery, you must have someone to drive you here, stay with you during the duration of your stay, and to drive you home at time of discharge.    12. The following link is for the educational video for patients and/or families.  [email or web address]    Special Instructions: Stop taking vitamins/herbal supplements (vitamin C) 7-10 days prior to surgery. Stop taking NSAIDs (meloxicam), aspirin, and other blood thinners (unless instructed otherwise) 5-7 days prior to surgery per Dr. Gillian Scarce' instructions.      TAKE ALL MEDICATIONS THE DAY OF SURGERY EXCEPT: meloxicam, vitamin c, potassium, hydrocodone-acetaminophen      I understand a pre-operative phone call will be made to verify my surgery time.  In the event that I am not available, I give  permission for a message to be left on my answering service and/or with another person?  yes         ___________________      __________   _________    (Signature of Patient)             (Witness)                (Date and Time)

## 2022-08-22 NOTE — Other (Signed)
Orthopedic and Spine Patients:  Instructions on When You Can   Eat or Drink Before Surgery      You have been provided 2 pre-surgery drinks received at your pre-admission testing appointment.    Night before surgery:  You should drink 1 bottle of the  pre-surgery drink at bedtime. No food after midnight!    Day of Surgery:  Complete 2nd bottle of the pre-surgery drink 1 hour prior to arrival at hospital.  For questions call Pre-Admission Testing at [phone].  They are available from 8:00am-5:00pm, Monday through Friday.

## 2022-08-22 NOTE — Other (Signed)
Incentive Spirometer        Using the incentive spirometer helps expand the small air sacs of your lungs, helps you breathe deeply, and helps improve your lung function.  Use your incentive spirometer twice a day (10 breaths each time) prior to surgery.      How to Use Your Incentive Spirometer:  Hold the incentive spirometer in an upright position.   Breathe out as usual.   Place the mouthpiece in your mouth and seal your lips tightly around it.   Take a deep breath.  Breathe in slowly and as deeply as possible. Keep the blue flow rate guide between the arrows.   Hold your breath as long as possible. Then exhale slowly and allow the piston to fall to the bottom of the column.   Rest for a few seconds and repeat steps one through five at least 10 times.     PAT Tidal Volume__1500____________  x_______2_________  Date___12/29/23_______________    Margretta Sidle THE INCENTIVE SPIROMETER WITH YOU TO THE HOSPITAL ON THE DAY OF YOUR SURGERY.  Opportunity given to ask and answer questions as well as to observe return demonstration.    Patient signature_____________________________    Witness____________________________

## 2022-08-22 NOTE — Other (Addendum)
Patient returned call and verified understanding of below instructions.     LVM for patient reviewing pre admission testing (PAT) lab results and provided instructions to start taking 2,000u of Vitamin D3 per day; requested return call to verify understanding of instructions.

## 2022-08-22 NOTE — Other (Signed)
Hibiclens/Chlorhexidine    Preventing Infections Before and After - Your Surgery    IMPORTANT INSTRUCTIONS    Please read and follow these instructions carefully. If you are unable to comply with the below instructions your procedure will be cancelled.       Every Night for Three (3) nights before your surgery:  Shower with an antibacterial soap, such as Dial, or the soap provided at your preassessment appointment. A shower is better than a bath for cleaning your skin.  If needed, ask someone to help you reach all areas of your body. Don't forget to clean your belly button with every shower.    The night before your surgery:   If you lose your Hibiclens/chlorhexidine please contact surgery center or you can purchase it at a local pharmacy  On the night before your surgery, shower with an antibacterial soap, such as Dial, or the soap provided at your preassessment appointment.   With one packet of Hibiclens/Chlorhexidine in hand, turn water off.  Apply Hibiclens antiseptic skin cleanser with a clean, freshly washed washcloth.  Gently apply to your body from chin to toes (except the genital area) and especially the area(s) where your incision(s) will be.  Leave Hibiclens/Chlorhexidine on your skin for at least 20 seconds.    CAUTION: If needed, Hibiclens/chlorhexidine may be used to clean the folds of skin of the legs (such as in the area of the groin) and on your buttocks and hips. However, do not use Hibiclens/Chlorhexidine above the neck or in the genital area (your bottom) or put inside any area of your body.  Turn the water back on and rinse.  Dry gently with a clean, freshly washed towel.  After your shower, do not use any powder, deodorant, perfumes or lotion.  Use clean, freshly washed towels and washcloths every time you shower.  Wear clean, freshly washed pajamas to bed the night before surgery.  Sleep on clean, freshly washed sheets.  Do not allow pets to sleep in your bed with you.        The Morning of  your surgery:  Shower again thoroughly with an antibacterial soap, such as Dial or the soap provided at your preassessment appointment. If needed, ask someone for help to reach all areas of your body. Don't forget to clean your belly button! Rinse.  Dry gently with a clean, freshly washed towel.  After your shower, do not use any powder, deodorant, perfumes or lotion prior to surgery.  Put on clean, freshly washed clothing.    Tips to help prevent infections after your surgery:  Protect your surgical wound from germs:  Hand washing is the most important thing you and your caregivers can do to prevent infections.  Keep your bandage clean and dry!  Do not touch your surgical wound.  Use clean, freshly washed towels and washcloths every time you shower; do not share bath linens with others.  Until your surgical wound is healed, wear clothing and sleep on bed linens each day that are clean and freshly washed.  Do not allow pets to sleep in your bed with you or touch your surgical wound.  Do not smoke - smoking delays wound healing. This may be a good time to stop smoking.  If you have diabetes, it is important for you to manage your blood sugar levels properly before your surgery as well as after your surgery. Poorly managed blood sugar levels slow down wound healing and prevent you from healing completely.      If you lose your Hibiclens/chlorhexidine, please call the Surgery Center, or it is available for purchase at your pharmacy.               ___________________      ___________________      ________________  (Signature of Patient)          (Witness)                   (Date and Time)

## 2022-08-22 NOTE — Progress Notes (Signed)
EKG from 08/22/2022 reviewed with Dr. Clydene Laming, anesthesiologist and compared with previous EKG from 04/20/17 and 01/02/14. Planned procedure, PMHx, functional status reviewed. Pt ok to proceed with planned procedure per Dr. Clydene Laming

## 2022-08-22 NOTE — Progress Notes (Signed)
[place] [address]  PHYSICAL THERAPY PRE-SURGERY EVALUATION       Date: 08/22/2022  Patient: Katherine [name] (67 y.o. 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 NOTE: pt does have significant hx of falls due to Meniere's Ds and states years ago had gentamicin placed in ear as treatment and states that because of that she "has no balance on my R side".     Discussed anticipated disposition to home with possible discharge within a 0 to 2 day time frame post-surgery. Patient's coach was present for the session.  Patient and coach in agreement.     Anticipate patient will need acute PT and OT orders based on current and expected deficits post surgery.  NOTE: with pt's hx of falls, current balance issues, weakness and unsteadiness in gait, pt may need rehab post surgery pending her progress and safety.      GOALS: (Goals have been discussed and agreed upon with patient.)  DISCHARGE GOALS: Time Frame: 1 DAY  Patient will demonstrate increased strength, range of motion, and pain control via a home exercise program in order to minimize functional deficits in preparation for their upcoming surgery. This will be achieved by using education, demonstration, and through the use of an informational handout including a home exercise program.  REHABILITATION POTENTIAL FOR STATED GOALS: Fair     RECOMMENDATIONS AND PLANNED  INTERVENTIONS: (Benefits and precautions of physical therapy have been discussed with the patient.)  Home Exercise Program  TREATMENT PLAN EFFECTIVE DATES: 08/22/2022 to 08/22/2022  FREQUENCY/DURATION: Patient to continue to perform home exercise program at least twice daily until surgery.       SUBJECTIVE:     Patient stated "I have no balance on my R side."    OBJECTIVE DATA SUMMARY:   HISTORY:      Past Medical History:   Diagnosis Date    Adverse effect of anesthesia     hard time breathing after surgery, sister hard to wake up    Arthritis     Burning with urination     Chronic obstructive pulmonary disease (HCC)     mild per pt    Depression     Diarrhea     Diverticulosis     Dizziness     Easy bruising     Fibromyalgia     chronic pain    Gentamicin ototoxicity, right     no balance on this side of her body    GERD (gastroesophageal reflux disease)     Headache(784.0)     Hearing loss     right    IBS (irritable bowel syndrome)     Ill-defined condition     past hx of shingles--eye/ear 13 years ago    Lupus ([place])     Meniere disease     Night sweats     Numbness  hand and feet    Rheumatoid arthritis, unspecified ([place])     Sleep apnea     did not tolerate cpap    Sweats, menopausal     Visual loss     right     Past Surgical History:   Procedure Laterality Date    APPENDECTOMY      BREAST BIOPSY Right     benign core bx    CARPAL TUNNEL RELEASE Right 01/2014    with trigger finger    CHOLECYSTECTOMY      COLONOSCOPY      COLONOSCOPY N/A 09/15/2018    COLONOSCOPY performed by Christin Bach, MD at [place] Right     GYN      hysterectomy    HEENT      two ear surgeries to try to fix meniere's disease, second one they used gentimycin, ruined her balance always dizzy    HERNIA REPAIR      left, incisional after first bladder repair    KNEE ARTHROSCOPY Right     [place]    back x2, no hardware    OTHER SURGICAL HISTORY  11/18/2021    EXCISION 3CM RIGHT  CHEST WALL MASS    TONSILLECTOMY      TOTAL COLECTOMY  1994    1 foot of colon removed    TOTAL KNEE ARTHROPLASTY Right 2019    UPPER GASTROINTESTINAL ENDOSCOPY      with dilatation    UROLOGICAL SURGERY      bladder surgery x2       Prior Level of Function/Home Situation: walks with a cane due to the radiating back pain initially mostly on L but now also sxs on R;   Personal factors and/or comorbidities impacting plan of care: hx of meniere's dx, dizziness, hx years ago of gentomycin placed in R ear for tx which resulted "in no balance on my R side" and many falls. Bad fall in the Spring 2023 when she believes she initially hurt her back; Old TKR on R    Home Situation:  Type of Home: [place]: One level; see next  Home Access: 6 steps to enter  rails on first 4 on deck with entry to den, and then grab bars to hold on 2 up into kitchen and then on one level  Lives with: other: fiance  Home Equipment: straight cane, rollator, and rolling walker; shower does have built in marble seat - suggested suction mat on top to avoid slipping when seated, uses now as shelf, definitely recommend using seat post surgery  Bathroom Set up: walk in shower       EXAMINATION/PRESENTATION/DECISION MAKING:     ADLs (Current Functional Status):   Bathing/Showering:   '[x]'$  Independent  '[]'$  Requires Assistance from Someone  '[]'$  Sponge Bath Only   Ambulation:  '[x]'$  Independent  '[]'$  Walk Indoors Only  '[]'$  Walk Outdoors  '[]'$  Use Assistive Device  '[]'$  Use Wheelchair Only     Dressing:  '[x]'$  Independent    Requires Assistance from Someone for:  '[]'$  Sock/Shoes  '[]'$  Pants  '[]'$  Everything   Household Activities:  '[x]'$  Routine house and yard work, although does have family that helps if needed  '[]'$  Light Housework Only  '[]'$  None         Cognitive/Behavioral Status:  oriented to time, place, person and situation    Strength:    Weakness at B ankle dorsiflexion,  3+ to 4-/5 on R but 3-/5 on L; knee ext functional but tolerance to testing is limited due to  pain as is hip testing. NOTE: she does present with shaking in extremities and then in trunk with resistive testing and seated ROM testing to BLE, not clonic or ataxic in nature, just overall shaking    Tone & Sensation:   Tone: normal, but see above  Sensation: Impaired LLE    Range Of Motion:  AROM generally decreased, functional except partial foot drop on L    Functional Mobility:  Bed Mobility:    Rolling Independent  Sit to supine Independent  Supine to sit Independent    Transfers:  Sit to stand Independent extra time  Stand to sit Independent extra time  Bed to chair transfer modified independent, cane, extra time    Balance:   Sitting static: Good (unsupported);  Sitting dynamic: Good (unsupported);  Standing static: Fair (occasional); with cane  Standing dynamic: Fair (occasional); with cane    Ambulation:  Level of assist: supervision/set-up  Base of support: center of gravity altered and shift to right  Gait speed and cadence: pace decreased (<100 feet/min)  Gait abnormalities: antalgic  altered arm swing  decreased step clearance  foot drop  trunk sway increase   Distance (feet): 10 feet   Equipment used during ambulation: a single point cane   *discussed with pt need to prevent falls prior to and post surgery; recommended using her rolling walker as well prior to surgery now due to her gait issues and balance deficits and hx of falls          Functional Measure:  [place] Mobility Inpatient Short Form (6-Clicks) Version 2  How much HELP from another person do you currently need... (If the patient hasn't done an activity recently, how much help from another person do you think they would need if they tried?) Total A Lot A Little None   1.  Turning from your back to your side while in a flat bed without using bedrails? '[]'$   1 '[]'$   2 '[]'$   3  '[x]'$   4   2.  Moving from lying on your back to sitting on the side of a flat bed without using bedrails? '[]'$   1 '[]'$   2 '[]'$   3  '[x]'$   4   3.   Moving to and from a bed to a chair (including a wheelchair)? '[]'$   1 '[]'$   2 '[]'$   3  '[x]'$   4   4. Standing up from a chair using your arms (e.g. wheelchair or bedside chair)? '[]'$   1 '[]'$   2 '[]'$   3  '[x]'$   4   5.  Walking in hospital room? '[]'$   1 '[]'$   2 '[]'$   3  '[x]'$   4   6.  Climbing 3-5 steps with a railing? '[]'$   1 '[]'$   2 '[]'$   3  '[x]'$   4     Raw Score: 24/24   (per pt report)                         Cutoff score ?171,2,3 had higher odds of discharging home with home health or need of SNF/IPR.    [place], Jon Gills, Vinoth Debbe Bales, Mena Goes Passek, Roslynn Amble. Annabell Howells.  Validity of the AM-PAC "6-Clicks" Inpatient Daily Activity and Basic Mobility Short Forms. Physical Therapy Mar  2014, 94 (3) 379-391; DOI: 10.2522/ptj.20130199  2. Raynelle Chary. Association of AM-PAC "6-Clicks" Basic Mobility and Daily Activity Scores With Discharge Destination. Phys Ther. 2021 Apr 4;101(4):pzab043. doi: 10.1093/ptj/pzab043. PMID: 25366440.  [place], Rajaraman D, Tanna Furry, Agayby K, [place]. Activity Measure for Post-Acute Care "6-Clicks" Basic Mobility Scores Predict Discharge Destination After Acute Care Hospitalization in Select Patient Groups: A Retrospective, Observational Study. Arch Rehabil Res Clin Transl. 2022 Jul 16;4(3):100204. doi: 10.1016/j.arrct.3474.259563. PMID: 87564332; PMCID: [id number].  4. Vonzell Schlatter, [place] W, [place] P. AM-PAC Short Forms Manual 4.0. Revised 09/2018.          8 min '[x]'$ Eval - untimed                          Therapeutic Procedures:  Tx Min Procedure, Rationale, Specifics   25 The patient was educated on:  '[x]'$          Early post operative mobility is imperative to achieve a patient's desired outcomes and to restore biological function.  '[x]'$          Post operative spinal precautions may/may not be applicable. These precautions are based on the patient's physician and the procedure(s) performed.    '[x]'$     Spinal precautions including:    No  bending forward, sideways, or backwards    No twisting     No lifting more than 5-10 pounds    No sitting longer than 30-60 minutes at a time    Donn brace when out of bed and mobilizing    97110 Therapeutic Exercise (timed):  increase ROM, strength, coordination, balance, and proprioception to improve patient's ability to progress to PLOF and address remaining functional goals.  Handout provided.     97116 Gait Training (timed):  10 feet with SPC (assistive device) over level surfaces with supervision level of assist. Cueing for use of rolling walker to prevent falls, see above.  To improve safety and dynamic movement with household/community ambulation.     63016 Therapeutic Activity (timed):  use of dynamic activities replicating functional movements to increase ROM, strength, coordination, balance, and proprioception in order to improve patient's ability to progress to PLOF and address remaining functional goals.  (see flow sheet as applicable)     Details if applicable:     25 Total        Pain Rating:  8/10 L low back to L leg to foot, R low back to knee    Activity Tolerance:   Fair     Patient does not state signs/symptoms of shortness of breath/dyspnea on exertion/respiratory distress.  COMMUNICATION OF ABOVE EDUCATION:     The patient's plan of care was discussed as follows:   '[x]'$          The patient verbalized understanding of her plan in preparation for their upcoming surgery  '[x]'$          The patient's coach was present for this session  '[]'$         The patient reports that he/she does not have a coach identified at this time  '[x]'$          The coach verbalized understanding of the education regarding the patient's upcoming surgery  '[x]'$          Patient/family agree to work toward stated goals and plan of care.  '[]'$          Patient understands intent and goals of therapy, but  is neutral about his/her participation.  '[]'$          Patient is unable to participate in goal setting and plan of care.      Thank you  for this referral.  Jodell Cipro Doornik, PT      Time  In / Out       Total Treatment Time     Total Timed Codes     1:1 Treatment Time        [facility] Totals Reminder:  bill using total billable   min of TIMED therapeutic procedures and modalities.   8-22 min = 1 unit; 23-37 min = 2 units; 38-52 min = 3 units;  53-67 min = 4 units; 68-82 min = 5 units     Patient DOB verified yes     Visit #   Current  / Total 1 1         Physical Therapy Evaluation Charge Determination   History Examination Presentation Decision-Making   HIGH Complexity :3+ comorbidities / personal factors will impact the outcome/ POC  MEDIUM Complexity : 3 Standardized tests and measures addressin body structure, function, activity limitation and / or participation in recreation  MEDIUM Complexity : Evolving with changing characteristics  AM-PAC  LOW      Based on the above components, the patient evaluation is determined to be of the following complexity level: Low

## 2022-08-23 LAB — EKG 12-LEAD
Atrial Rate: 88 {beats}/min
Diagnosis: NORMAL
P Axis: 25 degrees
P-R Interval: 166 ms
Q-T Interval: 382 ms
QRS Duration: 92 ms
QTc Calculation (Bazett): 462 ms
R Axis: -6 degrees
T Axis: 13 degrees
Ventricular Rate: 88 {beats}/min

## 2022-08-23 LAB — CULTURE, MRSA, SCREENING

## 2022-09-12 ENCOUNTER — Encounter

## 2022-09-26 ENCOUNTER — Ambulatory Visit: Payer: MEDICARE | Primary: Family Medicine

## 2022-11-10 ENCOUNTER — Encounter

## 2022-11-10 ENCOUNTER — Ambulatory Visit: Admit: 2022-11-10 | Discharge: 2022-11-10 | Payer: MEDICARE | Attending: Surgery | Primary: Family Medicine

## 2022-11-10 DIAGNOSIS — R222 Localized swelling, mass and lump, trunk: Secondary | ICD-10-CM

## 2022-11-10 NOTE — Progress Notes (Signed)
Subjective:      Patient ID: Katherine [name] is a 67 y.o. female who comes in for chest wall lump      Chief Complaint   Patient presents with    Follow-up     Follow up for cyst on abdomen.        HPI    She noted a right anterior chest wall mass and tenderness Feb 2023.Marland Kitchen   She denies trauma, skin changes or drainage.   It sits near the lower aspect of her bra.  She has a  hx dermal cysts and thought it may be that.  I resected a lipoma from the area 11/18/2021.   She now feels a lump in the area x 2 months.   It is quite tender.   She denies trauma but she cannot wear a bra any longer due to pain.      Past Medical History:   Diagnosis Date    Adverse effect of anesthesia     hard time breathing after surgery, sister hard to wake up    Arthritis     Burning with urination     Chronic obstructive pulmonary disease (HCC)     mild per pt    Depression     Diarrhea     Diverticulosis     Dizziness     Easy bruising     Fibromyalgia     chronic pain    Gentamicin ototoxicity, right     no balance on this side of her body    GERD (gastroesophageal reflux disease)     Headache(784.0)     Hearing loss     bilateral    IBS (irritable bowel syndrome)     Ill-defined condition     past hx of shingles--eye/ear 13 years ago    Lupus (HCC)     Meniere disease     Night sweats     Numbness     hand and feet    Rheumatoid arthritis, unspecified ([place])     Sleep apnea     did not tolerate cpap    Sweats, menopausal     Visual loss     right     Past Surgical History:   Procedure Laterality Date    APPENDECTOMY      BREAST BIOPSY Right     benign core bx    CARPAL TUNNEL RELEASE Right 01/2014    with trigger finger    CHOLECYSTECTOMY      COLONOSCOPY      COLONOSCOPY N/A 09/15/2018    COLONOSCOPY performed by Christin Bach, MD at [place] Right     GYN      hysterectomy    HEENT      two ear surgeries to try to fix meniere's disease, second one they used gentimycin, ruined her balance always dizzy     HERNIA REPAIR      left, incisional after first bladder repair    KNEE ARTHROSCOPY Right     [place]    back x2, no hardware    OTHER SURGICAL HISTORY  11/18/2021    EXCISION 3CM RIGHT CHEST WALL MASS    TONSILLECTOMY      TOTAL COLECTOMY  1994    1 foot of colon removed    TOTAL KNEE ARTHROPLASTY Right 2019    UPPER GASTROINTESTINAL ENDOSCOPY      with dilatation    UROLOGICAL  SURGERY      bladder surgery x2     Family History   Problem Relation Age of Onset    Heart Disease Sister     Heart Disease Mother     Cancer Mother         breast cancer    Breast Cancer Mother 41    Hypertension Father     Diabetes Father      Social History     Tobacco Use    Smoking status: Former     Current packs/day: 0.00     Average packs/day: 0.5 packs/day for 40.0 years (20.0 ttl pk-yrs)     Types: Cigarettes     Start date: 33     Quit date: 2015     Years since quitting: 9.2    Smokeless tobacco: Never   Vaping Use    Vaping Use: Never used   Substance Use Topics    Alcohol use: Yes     Comment: very seldom    Drug use: Never     Current Outpatient Medications   Medication Sig    Ascorbic Acid (VITAMIN C PO) Take by mouth daily    Potassium Chloride 10 MEQ PACK Take by mouth in the morning and at bedtime    albuterol (PROVENTIL) (2.5 MG/3ML) 0.083% nebulizer solution Inhale 1 each into the lungs as needed    anifrolumab-fnia (SAPHNELO) 300 MG/2ML SOLN IV solution Infuse intravenously monthly    azelastine (ASTELIN) 0.1 % nasal spray 1 spray by Nasal route 2 times daily    busPIRone (BUSPAR) 10 MG tablet Take 1 tablet by mouth 3 times daily    cimetidine (TAGAMET) 400 MG tablet Take 1 tablet by mouth 2 times daily    cyclobenzaprine (FLEXERIL) 10 MG tablet in the morning, at noon, and at bedtime    DULoxetine (CYMBALTA) 60 MG extended release capsule Take 2 capsules by mouth daily    ergocalciferol (ERGOCALCIFEROL) 1.25 MG (50000 UT) capsule Take by mouth every 7 days Mondays    folic acid (FOLVITE) 1  MG tablet TAKE 1 TABLET BY MOUTH EVERY DAY    gabapentin (NEURONTIN) 400 MG capsule Take 2 capsules by mouth daily. ceived the following from [facility] [facility]: Outside name: gabapentin (NEURONTIN) 400 mg capsule    HYDROcodone-acetaminophen (NORCO) 10-325 MG per tablet Take 2 tablets by mouth 4 times daily.    hydroxychloroquine (PLAQUENIL) 200 MG tablet Take by mouth 2 times daily    hydrOXYzine HCl (ATARAX) 10 MG tablet Take 2 tablets by mouth as needed    meloxicam (MOBIC) 15 MG tablet Take by mouth daily    nortriptyline (PAMELOR) 25 MG capsule Take by mouth    prednisoLONE acetate (PRED FORTE) 1 % ophthalmic suspension Apply 1 drop to eye    predniSONE (DELTASONE) 5 MG tablet TAKE 1-2 TABLETS BY MOUTH EVERY MORNING    rizatriptan (MAXALT) 10 MG tablet TAKE 1 TABLET (10 MG) BY ORAL ROUTE ONCE, MAY REPEAT AFTER 2 HOURS. NO MORE THAN 2/DAY    topiramate (TOPAMAX) 200 MG tablet Take 1 tablet by mouth 2 times daily    triamterene-hydroCHLOROthiazide (DYAZIDE) 37.5-25 MG per capsule Take 1 capsule by mouth daily    valACYclovir (VALTREX) 1 g tablet Take 0.5 tablets by mouth daily     No current facility-administered medications for this visit.     Allergies   Allergen Reactions    Morphine Other (See Comments)     "it does not  stop the pain"    Penicillins Rash and Swelling         Review of Systems   Constitutional:  Positive for diaphoresis. Negative for chills, fatigue, fever and unexpected weight change.   HENT:  Negative for congestion, ear pain and sore throat.    Eyes:  Negative for pain.   Respiratory:  Positive for apnea. Negative for cough, shortness of breath, wheezing and stridor.    Cardiovascular:  Negative for chest pain, palpitations and leg swelling.   Gastrointestinal:  Negative for abdominal pain, blood in stool, constipation, diarrhea, nausea and vomiting.   Endocrine: Negative for polydipsia.   Genitourinary:  Negative for dysuria, flank pain, frequency, hematuria and urgency.    Musculoskeletal:  Positive for arthralgias, back pain, gait problem and myalgias.   Neurological:  Positive for dizziness and numbness. Negative for seizures, weakness and headaches.   Hematological:  Bruises/bleeds easily.   Psychiatric/Behavioral:  Positive for dysphoric mood. Negative for behavioral problems. The patient is nervous/anxious.          BP 117/78 (Site: Left Upper Arm, Position: Sitting, Cuff Size: Large Adult)   Pulse 86   Temp 98.2 F (36.8 C) (Oral)   Resp 18   Ht 1.664 m (5' 5.5")   Wt 98.2 kg (216 lb 9.6 oz)   SpO2 91%   BMI 35.50 kg/m     Objective:   Physical Exam  Vitals and nursing note reviewed. Exam conducted with a chaperone present.   Constitutional:       General: She is not in acute distress.     Appearance: Normal appearance. She is well-developed. She is not ill-appearing or diaphoretic.   HENT:      Head: Normocephalic and atraumatic.   Eyes:      General: No scleral icterus.     Extraocular Movements: Extraocular movements intact.      Conjunctiva/sclera: Conjunctivae normal.      Pupils: Pupils are equal, round, and reactive to light.   Neck:      Thyroid: No thyroid mass or thyromegaly.      Trachea: Trachea and phonation normal. No tracheal deviation.   Cardiovascular:      Rate and Rhythm: Normal rate and regular rhythm.      Heart sounds: Normal heart sounds. No murmur heard.     No friction rub. No gallop.   Pulmonary:      Effort: Pulmonary effort is normal. No respiratory distress.      Breath sounds: Normal breath sounds. No stridor. No wheezing or rales.   Chest:      Chest wall: Mass (2 cm subcutaneous mass at costal edge) present.       Abdominal:      General: Bowel sounds are normal. There is no distension.      Palpations: There is no mass.      Tenderness: There is no abdominal tenderness. There is no guarding or rebound.      Hernia: No hernia is present.   Musculoskeletal:         General: No swelling or tenderness. Normal range of motion.      Cervical  back: Normal range of motion and neck supple.   Lymphadenopathy:      Cervical: No cervical adenopathy.      Upper Body:      Right upper body: No supraclavicular adenopathy.      Left upper body: No supraclavicular adenopathy.   Skin:     Coloration:  Skin is not jaundiced.      Findings: No erythema or rash.   Neurological:      Mental Status: She is alert and oriented to person, place, and time.      Cranial Nerves: No cranial nerve deficit.      Coordination: Coordination normal.      Gait: Gait normal.   Psychiatric:         Behavior: Behavior normal.         Thought Content: Thought content normal.         Judgment: Judgment normal.             Assessment / Plan:       Right chest wall mass, tender.   It is below prior lipoma excision scar.  It may be fat necrosis or something else benign but she wants it out.  I explained to her about the anatomy and pathophysiology of the process and risks of excision including, but not limited to, bleeding, infection seroma, recurrence, poor healing/cosmesis.    Lupus.  On prednisone.  Higher infection risk    Tobacco abuse.   Strongly encouraged cessation    Essential hypertension.  Stable on rx    Sleep apnea    Anxiety/depression. Stable on rx continue    Chronic pain.  On gabapentin   Social.  Husband died unexpectedly from [place] 10-22-2022    She desires excision of a 2 cm right anterior chest wall mass under MAC as an outpatient      Luellen Pucker, MD FACS

## 2022-11-10 NOTE — Progress Notes (Signed)
Identified pt with two pt identifiers (name and DOB). Reviewed chart in preparation for visit and have obtained necessary documentation.    Katherine [name] is a 67 y.o. female  Chief Complaint   Patient presents with    Follow-up     Follow up for cyst on abdomen.      Ht 1.664 m (5' 5.5")   BMI 34.83 kg/m     1. Have you been to the ER, urgent care clinic since your last visit?  Hospitalized since your last visit?no    2. Have you seen or consulted any other health care providers outside of the [facility] since your last visit?  Include any pap smears or colon screening. no

## 2022-11-10 NOTE — H&P (View-Only) (Signed)
Subjective:      Patient ID: Katherine [name] is a 67 y.o. female who comes in for chest wall lump      Chief Complaint   Patient presents with    Follow-up     Follow up for cyst on abdomen.        HPI    She noted a right anterior chest wall mass and tenderness Feb 2023.Marland Kitchen   She denies trauma, skin changes or drainage.   It sits near the lower aspect of her bra.  She has a  hx dermal cysts and thought it may be that.  I resected a lipoma from the area 11/18/2021.   She now feels a lump in the area x 2 months.   It is quite tender.   She denies trauma but she cannot wear a bra any longer due to pain.      Past Medical History:   Diagnosis Date    Adverse effect of anesthesia     hard time breathing after surgery, sister hard to wake up    Arthritis     Burning with urination     Chronic obstructive pulmonary disease (HCC)     mild per pt    Depression     Diarrhea     Diverticulosis     Dizziness     Easy bruising     Fibromyalgia     chronic pain    Gentamicin ototoxicity, right     no balance on this side of her body    GERD (gastroesophageal reflux disease)     Headache(784.0)     Hearing loss     bilateral    IBS (irritable bowel syndrome)     Ill-defined condition     past hx of shingles--eye/ear 13 years ago    Lupus (HCC)     Meniere disease     Night sweats     Numbness     hand and feet    Rheumatoid arthritis, unspecified ([place])     Sleep apnea     did not tolerate cpap    Sweats, menopausal     Visual loss     right     Past Surgical History:   Procedure Laterality Date    APPENDECTOMY      BREAST BIOPSY Right     benign core bx    CARPAL TUNNEL RELEASE Right 01/2014    with trigger finger    CHOLECYSTECTOMY      COLONOSCOPY      COLONOSCOPY N/A 09/15/2018    COLONOSCOPY performed by Christin Bach, MD at [place] Right     GYN      hysterectomy    HEENT      two ear surgeries to try to fix meniere's disease, second one they used gentimycin, ruined her balance always dizzy     HERNIA REPAIR      left, incisional after first bladder repair    KNEE ARTHROSCOPY Right     [place]    back x2, no hardware    OTHER SURGICAL HISTORY  11/18/2021    EXCISION 3CM RIGHT CHEST WALL MASS    TONSILLECTOMY      TOTAL COLECTOMY  1994    1 foot of colon removed    TOTAL KNEE ARTHROPLASTY Right 2019    UPPER GASTROINTESTINAL ENDOSCOPY      with dilatation    UROLOGICAL  SURGERY      bladder surgery x2     Family History   Problem Relation Age of Onset    Heart Disease Sister     Heart Disease Mother     Cancer Mother         breast cancer    Breast Cancer Mother 41    Hypertension Father     Diabetes Father      Social History     Tobacco Use    Smoking status: Former     Current packs/day: 0.00     Average packs/day: 0.5 packs/day for 40.0 years (20.0 ttl pk-yrs)     Types: Cigarettes     Start date: 33     Quit date: 2015     Years since quitting: 9.2    Smokeless tobacco: Never   Vaping Use    Vaping Use: Never used   Substance Use Topics    Alcohol use: Yes     Comment: very seldom    Drug use: Never     Current Outpatient Medications   Medication Sig    Ascorbic Acid (VITAMIN C PO) Take by mouth daily    Potassium Chloride 10 MEQ PACK Take by mouth in the morning and at bedtime    albuterol (PROVENTIL) (2.5 MG/3ML) 0.083% nebulizer solution Inhale 1 each into the lungs as needed    anifrolumab-fnia (SAPHNELO) 300 MG/2ML SOLN IV solution Infuse intravenously monthly    azelastine (ASTELIN) 0.1 % nasal spray 1 spray by Nasal route 2 times daily    busPIRone (BUSPAR) 10 MG tablet Take 1 tablet by mouth 3 times daily    cimetidine (TAGAMET) 400 MG tablet Take 1 tablet by mouth 2 times daily    cyclobenzaprine (FLEXERIL) 10 MG tablet in the morning, at noon, and at bedtime    DULoxetine (CYMBALTA) 60 MG extended release capsule Take 2 capsules by mouth daily    ergocalciferol (ERGOCALCIFEROL) 1.25 MG (50000 UT) capsule Take by mouth every 7 days Mondays    folic acid (FOLVITE) 1  MG tablet TAKE 1 TABLET BY MOUTH EVERY DAY    gabapentin (NEURONTIN) 400 MG capsule Take 2 capsules by mouth daily. ceived the following from [facility]: Outside name: gabapentin (NEURONTIN) 400 mg capsule    HYDROcodone-acetaminophen (NORCO) 10-325 MG per tablet Take 2 tablets by mouth 4 times daily.    hydroxychloroquine (PLAQUENIL) 200 MG tablet Take by mouth 2 times daily    hydrOXYzine HCl (ATARAX) 10 MG tablet Take 2 tablets by mouth as needed    meloxicam (MOBIC) 15 MG tablet Take by mouth daily    nortriptyline (PAMELOR) 25 MG capsule Take by mouth    prednisoLONE acetate (PRED FORTE) 1 % ophthalmic suspension Apply 1 drop to eye    predniSONE (DELTASONE) 5 MG tablet TAKE 1-2 TABLETS BY MOUTH EVERY MORNING    rizatriptan (MAXALT) 10 MG tablet TAKE 1 TABLET (10 MG) BY ORAL ROUTE ONCE, MAY REPEAT AFTER 2 HOURS. NO MORE THAN 2/DAY    topiramate (TOPAMAX) 200 MG tablet Take 1 tablet by mouth 2 times daily    triamterene-hydroCHLOROthiazide (DYAZIDE) 37.5-25 MG per capsule Take 1 capsule by mouth daily    valACYclovir (VALTREX) 1 g tablet Take 0.5 tablets by mouth daily     No current facility-administered medications for this visit.     Allergies   Allergen Reactions    Morphine Other (See Comments)     "it does not  stop the pain"    Penicillins Rash and Swelling         Review of Systems   Constitutional:  Positive for diaphoresis. Negative for chills, fatigue, fever and unexpected weight change.   HENT:  Negative for congestion, ear pain and sore throat.    Eyes:  Negative for pain.   Respiratory:  Positive for apnea. Negative for cough, shortness of breath, wheezing and stridor.    Cardiovascular:  Negative for chest pain, palpitations and leg swelling.   Gastrointestinal:  Negative for abdominal pain, blood in stool, constipation, diarrhea, nausea and vomiting.   Endocrine: Negative for polydipsia.   Genitourinary:  Negative for dysuria, flank pain, frequency, hematuria and urgency.    Musculoskeletal:  Positive for arthralgias, back pain, gait problem and myalgias.   Neurological:  Positive for dizziness and numbness. Negative for seizures, weakness and headaches.   Hematological:  Bruises/bleeds easily.   Psychiatric/Behavioral:  Positive for dysphoric mood. Negative for behavioral problems. The patient is nervous/anxious.          BP 117/78 (Site: Left Upper Arm, Position: Sitting, Cuff Size: Large Adult)   Pulse 86   Temp 98.2 F (36.8 C) (Oral)   Resp 18   Ht 1.664 m (5' 5.5")   Wt 98.2 kg (216 lb 9.6 oz)   SpO2 91%   BMI 35.50 kg/m     Objective:   Physical Exam  Vitals and nursing note reviewed. Exam conducted with a chaperone present.   Constitutional:       General: She is not in acute distress.     Appearance: Normal appearance. She is well-developed. She is not ill-appearing or diaphoretic.   HENT:      Head: Normocephalic and atraumatic.   Eyes:      General: No scleral icterus.     Extraocular Movements: Extraocular movements intact.      Conjunctiva/sclera: Conjunctivae normal.      Pupils: Pupils are equal, round, and reactive to light.   Neck:      Thyroid: No thyroid mass or thyromegaly.      Trachea: Trachea and phonation normal. No tracheal deviation.   Cardiovascular:      Rate and Rhythm: Normal rate and regular rhythm.      Heart sounds: Normal heart sounds. No murmur heard.     No friction rub. No gallop.   Pulmonary:      Effort: Pulmonary effort is normal. No respiratory distress.      Breath sounds: Normal breath sounds. No stridor. No wheezing or rales.   Chest:      Chest wall: Mass (2 cm subcutaneous mass at costal edge) present.       Abdominal:      General: Bowel sounds are normal. There is no distension.      Palpations: There is no mass.      Tenderness: There is no abdominal tenderness. There is no guarding or rebound.      Hernia: No hernia is present.   Musculoskeletal:         General: No swelling or tenderness. Normal range of motion.      Cervical  back: Normal range of motion and neck supple.   Lymphadenopathy:      Cervical: No cervical adenopathy.      Upper Body:      Right upper body: No supraclavicular adenopathy.      Left upper body: No supraclavicular adenopathy.   Skin:     Coloration:  Skin is not jaundiced.      Findings: No erythema or rash.   Neurological:      Mental Status: She is alert and oriented to person, place, and time.      Cranial Nerves: No cranial nerve deficit.      Coordination: Coordination normal.      Gait: Gait normal.   Psychiatric:         Behavior: Behavior normal.         Thought Content: Thought content normal.         Judgment: Judgment normal.             Assessment / Plan:       Right chest wall mass, tender.   It is below prior lipoma excision scar.  It may be fat necrosis or something else benign but she wants it out.  I explained to her about the anatomy and pathophysiology of the process and risks of excision including, but not limited to, bleeding, infection seroma, recurrence, poor healing/cosmesis.    Lupus.  On prednisone.  Higher infection risk    Tobacco abuse.   Strongly encouraged cessation    Essential hypertension.  Stable on rx    Sleep apnea    Anxiety/depression. Stable on rx continue    Chronic pain.  On gabapentin   Social.  Husband died unexpectedly from [place] 10-22-2022    She desires excision of a 2 cm right anterior chest wall mass under MAC as an outpatient      Luellen Pucker, MD FACS

## 2022-11-17 NOTE — Other (Signed)
OSA= 3 apnea - does not use CPAP

## 2022-11-17 NOTE — Progress Notes (Signed)
[facility]  Preoperative Instructions        Surgery Date 11/20/2022    Time of Arrival TBD  Contact# [phone]    1. On the day of your surgery, please report to the Surgical Services Registration Desk and sign in at your designated time. The Surgery Center is located to the right of the Emergency Room.     2. You must have someone with you to drive you home. You should not drive a car for 24 hours following surgery. Please make arrangements for a friend or family member to stay with you for the first 24 hours after your surgery.    3. Do not have anything to eat or drink (including water, gum, mints, coffee, juice) after midnight ?11/19/2022  .?This may not apply to medications prescribed by your physician. ?(Please note below the special instructions with medications to take the morning of your procedure.)    4. We recommend you do not drink any alcoholic beverages for 24 hours before and after your surgery.    5. Contact your surgeon's office for instructions on the following medications: non-steroidal anti-inflammatory drugs (i.e. Advil, Aleve), vitamins, and supplements. (Some surgeon's will want you to stop these medications prior to surgery and others may allow you to take them)  **If you are currently taking Plavix, Coumadin, Aspirin and/or other blood-thinning agents, contact your surgeon for instructions.** Your surgeon will partner with the physician prescribing these medications to determine if it is safe to stop or if you need to continue taking.  Please do not stop taking these medications without instructions from your surgeon    6. Wear comfortable clothes.  Wear glasses instead of contacts.  Do not bring any money or jewelry. Please bring picture ID, insurance card, and any prearranged co-payment or hospital payment.  Do not wear make-up, particularly mascara the morning of your surgery.  Do not wear nail polish, particularly if you are having foot /hand surgery.  Wear  your hair loose or down, no ponytails, buns, bobby pins or clips.  All body piercings must be removed.  Please shower with antibacterial soap for three consecutive days before and on the morning of surgery, but do not apply any lotions, powders or deodorants after the shower on the day of surgery. Please use a fresh towels after each shower. Please sleep in clean clothes and change bed linens the night before surgery.  Please do not shave for 48 hours prior to surgery. Shaving of the face is acceptable.    7. You should understand that if you do not follow these instructions your surgery may be cancelled.  If your physical condition changes (I.e. fever, cold or flu) please contact your surgeon as soon as possible.    8. It is important that you be on time.  If a situation occurs where you may be late, please call [phone] (OR Holding Area).    9. If you have any questions and or problems, please call [phone] (Pre-admission Testing).    10. Your surgery time may be subject to change.  You will receive a phone call the evening prior if your time changes.    11.  If having outpatient surgery, you must have someone to drive you here, stay with you during the duration of your stay, and to drive you home at time of discharge.       TAKE ALL MEDICATIONS DAY OF SURGERY EXCEPT:  vitamins or supplements, meloxicam  I understand a pre-operative phone call will be made to verify my surgery time.  In the event that I am not available, I give permission for a message to be left on my answering service and/or with another person?  yes         ___________________      __________   _________    (Signature of Patient)             (Witness)                (Date and Time)

## 2022-11-20 ENCOUNTER — Inpatient Hospital Stay: Payer: MEDICARE | Attending: Surgery

## 2022-11-20 MED ORDER — OXYCODONE HCL 5 MG PO TABS
5 | ORAL_TABLET | Freq: Four times a day (QID) | ORAL | 0 refills | Status: AC | PRN
Start: 2022-11-20 — End: 2022-11-23

## 2022-11-20 MED ORDER — SODIUM CHLORIDE 0.9 % IV SOLN
0.9 | INTRAVENOUS | Status: DC | PRN
Start: 2022-11-20 — End: 2022-11-20

## 2022-11-20 MED ORDER — STERILE WATER FOR INJECTION IJ SOLN
INTRAMUSCULAR | Status: AC
Start: 2022-11-20 — End: ?

## 2022-11-20 MED ORDER — BUPIVACAINE HCL (PF) 0.5 % IJ SOLN
0.5 | INTRAMUSCULAR | Status: AC
Start: 2022-11-20 — End: ?

## 2022-11-20 MED ORDER — NORMAL SALINE FLUSH 0.9 % IV SOLN
0.9 | INTRAVENOUS | Status: DC | PRN
Start: 2022-11-20 — End: 2022-11-20

## 2022-11-20 MED ORDER — LIDOCAINE-EPINEPHRINE 1 %-1:100000 IJ SOLN
1 | INTRAMUSCULAR | Status: DC | PRN
Start: 2022-11-20 — End: 2022-11-20
  Administered 2022-11-20: 19:00:00 9 via INTRADERMAL

## 2022-11-20 MED ORDER — NORMAL SALINE FLUSH 0.9 % IV SOLN
0.9 | Freq: Two times a day (BID) | INTRAVENOUS | Status: DC
Start: 2022-11-20 — End: 2022-11-20

## 2022-11-20 MED ORDER — ONDANSETRON HCL 4 MG/2ML IJ SOLN
4 MG/2ML | INTRAMUSCULAR | Status: DC | PRN
  Administered 2022-11-20: 19:00:00 4 via INTRAVENOUS

## 2022-11-20 MED ORDER — CEFAZOLIN SODIUM 1 G IJ SOLR
1 | INTRAMUSCULAR | Status: AC
Start: 2022-11-20 — End: ?

## 2022-11-20 MED ORDER — LACTATED RINGERS IV SOLN
INTRAVENOUS | Status: DC | PRN
  Administered 2022-11-20: 18:00:00 via INTRAVENOUS

## 2022-11-20 MED ORDER — FENTANYL CITRATE (PF) 100 MCG/2ML IJ SOLN
100 MCG/2ML | INTRAMUSCULAR | Status: DC | PRN
  Administered 2022-11-20 (×3): 25 via INTRAVENOUS

## 2022-11-20 MED ORDER — CEFAZOLIN SODIUM 1 G IJ SOLR
1 | Freq: Once | INTRAMUSCULAR | Status: DC
Start: 2022-11-20 — End: 2022-11-20

## 2022-11-20 MED ORDER — LIDOCAINE-EPINEPHRINE 1 %-1:100000 IJ SOLN
1 | INTRAMUSCULAR | Status: AC
Start: 2022-11-20 — End: ?

## 2022-11-20 MED ORDER — PROPOFOL 100 MG/10ML IV EMUL
100 MG/10ML | INTRAVENOUS | Status: DC | PRN
  Administered 2022-11-20: 18:00:00 30 via INTRAVENOUS
  Administered 2022-11-20: 18:00:00 80 via INTRAVENOUS
  Administered 2022-11-20: 19:00:00 20 via INTRAVENOUS

## 2022-11-20 MED ORDER — CEFAZOLIN SODIUM 2 GM/20ML IV SOSY
2 GM/20ML | INTRAVENOUS | Status: DC | PRN
  Administered 2022-11-20: 19:00:00 2000 via BUCCAL

## 2022-11-20 MED ORDER — LACTATED RINGERS IV SOLN
Freq: Once | INTRAVENOUS | Status: AC
Start: 2022-11-20 — End: 2022-11-20
  Administered 2022-11-20: 17:00:00 via INTRAVENOUS

## 2022-11-20 MED ORDER — KETOROLAC TROMETHAMINE 30 MG/ML IJ SOLN
30 | Freq: Once | INTRAMUSCULAR | Status: AC
Start: 2022-11-20 — End: 2022-11-20
  Administered 2022-11-20: 19:00:00 15 mg via INTRAVENOUS

## 2022-11-20 MED ORDER — FENTANYL CITRATE (PF) 100 MCG/2ML IJ SOLN
100 | INTRAMUSCULAR | Status: AC
Start: 2022-11-20 — End: ?

## 2022-11-20 MED FILL — KETOROLAC TROMETHAMINE 30 MG/ML IJ SOLN: 30 MG/ML | INTRAMUSCULAR | Qty: 1

## 2022-11-20 MED FILL — CEFAZOLIN SODIUM 1 G IJ SOLR: 1 g | INTRAMUSCULAR | Qty: 2000

## 2022-11-20 MED FILL — BUPIVACAINE HCL (PF) 0.5 % IJ SOLN: 0.5 % | INTRAMUSCULAR | Qty: 30

## 2022-11-20 MED FILL — LIDOCAINE-EPINEPHRINE 1 %-1:100000 IJ SOLN: 1 %-:00000 | INTRAMUSCULAR | Qty: 20

## 2022-11-20 MED FILL — STERILE WATER FOR INJECTION IJ SOLN: INTRAMUSCULAR | Qty: 20

## 2022-11-20 MED FILL — FENTANYL CITRATE (PF) 100 MCG/2ML IJ SOLN: 100 MCG/2ML | INTRAMUSCULAR | Qty: 2

## 2022-11-20 NOTE — Anesthesia Post-Procedure Evaluation (Signed)
Department of Anesthesiology  Postprocedure Note    Patient: RUBA SOBOTTA  MRN: [id number]  Birthdate: 1956-08-05  Date of evaluation: 11/20/2022    Procedure Summary       Date: 11/20/22 Room / Location: [facility] OR M5 / [facility] OR    Anesthesia Start: 1423 Anesthesia Stop: 1504    Procedure: EXCISION 2CM RIGHT CHEST WALL MASS (Right: Abdomen) Diagnosis:       Localized swelling, mass and lump, trunk      (Localized swelling, mass and lump, trunk [R22.2])    Providers: Luellen Pucker, MD Responsible Provider: Rubye Beach, MD    Anesthesia Type: General ASA Status: 3            Anesthesia Type: General    Aldrete Phase I: Aldrete Score: 8    Aldrete Phase II:      Anesthesia Post Evaluation    Patient location during evaluation: PACU  Patient participation: complete - patient participated  Level of consciousness: sleepy but conscious and responsive to verbal stimuli  Airway patency: patent  Nausea & Vomiting: no vomiting and no nausea  Cardiovascular status: blood pressure returned to baseline and hemodynamically stable  Respiratory status: acceptable  Hydration status: stable  Pain management: adequate    No notable events documented.

## 2022-11-20 NOTE — Discharge Instructions (Addendum)
Discharge Instructions:  Mass excision  Dr. Silvana Newness    Call for appointment for follow up in 2 weeks [phone]    Activity:    Walk regularly.  You may resume driving in 24 hours unless still requiring narcotics for pain.  It is ok to use the arm on side of surgery but do not over do it.      Work:    You may return to work in 1 to 2 days.     Diet:    You may resume normal diet after 24 hours.  Anesthesia and narcotics may cause nausea and vomiting.  If persistent please call the office.    Wound Care:    You have a special dressing called Dermabond.  It is okay to shower and let the water run over the incision but do not scrub the area or soak in a tub.  If you have a small amount of drainage you may place a dry bandage over the wound and change it daily.  If you experience a lot of drainage, develop redness around the wound, or a fever over 101 F occurs please call the office.    Medications:    Resume home medications as indicated on the Medical Reconciliation form.     Aspirin, Coumadin, and Plavix can be restarted on post operative day 2 if you were taking them preoperatively.      Pain medications:  Non steroidal antiinflammatories seem to work best for post surgical pain.  Try these first as prescribed.  A narcotic prescription will also be given for breakthrough pain.    Over the counter stool softeners and laxatives may be used if needed.    Do not hesitate to call with questions or concerns.      How to Care for Your Wound After It's Treated With  DERMABOND* Topical Skin Adhesive    DERMABOND* Topical Skin Adhesive (2-octyl cyanoacrylate) is a sterile, liquid skin adhesive  that holds wound edges together. The film will usually remain in place for 5 to 10 days, then  naturally fall off your skin.  The following will answer some of your questions and provide instructions for proper care for your  wound while it is healing:    CHECK WOUND APPEARANCE   Some swelling, redness, and pain are common with all  wounds and normally will go away as the  wound heals. If swelling, redness, or pain increases or if the wound feels warm to the touch,  contact a doctor. Also contact a doctor if the wound edges reopen or separate.    REPLACE BANDAGES   If your wound is bandaged, keep the bandage dry.   Replace the dressing daily until the adhesive film has fallen off or if the  bandage should become wet, unless otherwise instructed by your  physician.   When changing the dressing, do not place tape directly over the  DERMABOND adhesive film, because removing the tape later may also  remove the film.    AVOID TOPICAL MEDICATIONS   Do not apply liquid or ointment medications or any other product to your wound while the  DERMABOND adhesive film is in place. These may loosen the film before your wound is healed.    KEEP WOUND DRY AND PROTECTED   You may occasionally and briefly wet your wound in the shower or bath. Do not soak or scrub  your wound, do not swim, and avoid periods of heavy perspiration until the DERMABOND  adhesive has naturally fallen off. After showering or bathing, gently blot your wound dry with a  soft towel. If a protective dressing is being used, apply a fresh, dry bandage, being sure to keep  the tape off the DERMABOND adhesive film.   Apply a clean, dry bandage over the wound if necessary to protect it.   Protect your wound from injury until the skin has had sufficient time to heal.   Do not scratch, rub, or pick at the DERMABOND adhesive film. This may loosen the film before  your wound is healed.   Protect the wound from prolonged exposure to sunlight or tanning lamps while the film is in  place.    If you have any questions or concerns about this product, please consult your doctor.  *[employer]    DISCHARGE SUMMARY from Nurse    PATIENT INSTRUCTIONS:    After general anesthesia or intravenous sedation, for 24 hours or while taking prescription narcotics:    Have someone responsible help  you with your care  Limit your activities  Do not drive and operate hazardous machinery  Do not make important personal, legal or business decisions  Do not drink alcoholic beverages  If you have not urinated within 8 hours after discharge, please contact your surgeon on call  Resume your medications unless otherwise instructed    From general anesthesia, intravenous sedation, or while taking prescription narcotics, you may experience:    Drowsiness, dizziness, sleepiness, or confusion  Difficulty remembering or delayed reaction times  Difficulty with your balance, especially while walking, move slowly and carefully, do not make sudden position changes  Difficulty focusing or blurred vision    You may not be aware of slight changes in your behavior and/or your reaction time because of the medication used during and after your procedure.    Report the following to your surgeon:  Excessive pain, swelling, redness or odor of or around the surgical area  Temperature over 100.5  Nausea and vomiting lasting longer than 4 hours or if unable to take medications  Any signs of decreased circulation or nerve impairment to extremity: change in color, persistent numbness, tingling, coldness or increase pain  Any questions or concerns         IF YOU REPORT TO AN EMERGENCY ROOM, DOCTOR'S OFFICE OR HOSPITAL WITHIN 24 HOURS AFTER YOUR PROCEDURE, BRING THIS SHEET AND YOUR AFTER VISIT SUMMARY WITH YOU AND GIVE IT TO THE PHYSICIAN OR NURSE ATTENDING YOU.    These are general instructions for a healthy lifestyle (if applicable):    No smoking/ No tobacco products/ Avoid exposure to secondhand smoke  Surgeon General's Warning:  Quitting smoking now greatly reduces serious risk to your health.    Obesity, smoking, and sedentary lifestyle greatly increases your risk for illness    A healthy diet, regular physical exercise & weight monitoring are important for maintaining a healthy lifestyle    You may be retaining fluid if you have a  history of heart failure or if you experience any of the following symptoms:  Weight gain of 3 pounds or more overnight or 5 pounds in a week, increased swelling in our hands or feet or shortness of breath while lying flat in bed.  Please call your doctor as soon as you notice any of these symptoms; do not wait until your next office visit.    A common side effect of anesthesia following surgery is nausea and/or vomiting. In order to decrease  symptoms, it is wise to avoid foods that are high in fat, greasy foods, milk products, and spicy foods for the first 24 hours.    Acceptable foods for the first 24 hours following surgery include but are not limited to:    soup  broth  toast   crackers   applesauce  bananas   mashed potatoes,  soft or scrambled eggs  oatmeal  jello    It is important to eat when taking your pain medication. This will help to prevent nausea. If possible, please try to time your meals with your medications.    It is very important to stay hydrated following surgery. Sip fluids frequently while awake. Avoid acidic drinks such as citrus juices and soda for 24 hours. Carbonated beverages may cause bloating and gas. Acceptable fluids include:    water (flavor packets may add variety)  coffee or tea (in moderation)  Gatorade  Kool-Aid  apple juice  cranberry juice    You are encouraged to cough and deep breathe every hour when awake. This will help to prevent respiratory complications following anesthesia. You may want to hug a pillow when coughing and sneezing to add additional support to the surgical area and to decrease discomfort if you had abdominal or chest surgery.    If you are discharged home with support stockings, you may remove them after 24 hours. Support stockings are used to help prevent blood clots in the legs following surgery.    TO PREVENT AN INFECTION      [place] YOUR HANDS    To prevent infection, good handwashing is the most important thing you or your caregiver can do.      Wash  your hands with soap and water or use the hand sanitizer we gave you before you touch any wounds.    SHOWER    Use the antibacterial soap we gave you when you take a shower.     Shower with this soap until your wounds are healed.      To reach all areas of your body, you may need someone to help you.     Don't forget to clean your belly button with every shower.     USE CLEAN SHEETS    Use freshly cleaned sheets on your bed after surgery.     To keep the surgery site clean, do not allow pets to sleep with you while your wound is still healing.     STOP SMOKING    Stop smoking, or at least cut back on smoking    Smoking slows your healing.      CONTROL YOUR BLOOD SUGAR    High blood sugars slow wound healing.    If you are diabetic, control your blood sugar levels before and after your surgery.    Please take time to review all of your Home Care Instructions and Medication Information sheets provided in your discharge packet. If you have any questions, please contact your surgeon's office. Thank you.    The discharge information has been reviewed with the patient and instruction recipient.  The patient and instruction recipient verbalized understanding.  Discharge medications reviewed with the patient and instruction recipient and appropriate educational materials and side effects teaching were provided.      Please provide this summary of care documentation to your next provider.

## 2022-11-20 NOTE — Brief Op Note (Signed)
Brief Postoperative Note      Patient: Katherine [name]  Date of Birth: 07-18-1956  MRN: [id number]    Date of Procedure: 2022-12-04    Pre-Op Diagnosis Codes:     * Localized swelling, mass and lump, trunk [R22.2]    Post-Op Diagnosis: Post-Op Diagnosis Codes:     * Localized swelling, mass and lump, trunk [R22.2]       Procedure(s):  EXCISION 2CM RIGHT CHEST WALL MASS    Surgeon(s):  Luellen Pucker, MD    Assistant:  First Assistant: Alfredo Bach, RN    Anesthesia: Monitor Anesthesia Care    Estimated Blood Loss (mL): Minimal    Complications: None    Specimens:   ID Type Source Tests Collected by Time Destination   1 : Right chest wall mass Tissue Tissue SURGICAL PATHOLOGY Luellen Pucker, MD 2022-12-04 1450        Implants:  * No implants in log *      Drains: * No LDAs found *    Findings: mass  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

## 2022-11-20 NOTE — Op Note (Signed)
[id number]

## 2022-11-20 NOTE — Anesthesia Pre-Procedure Evaluation (Addendum)
Department of Anesthesiology  Preprocedure Note       Name:  Katherine [name]   Age:  67 y.o.  DOB:  08/29/1955                                          MRN:  [id number]         Date:  11/20/2022      Surgeon: Juliann Mule):  Luellen Pucker, MD    Procedure: Procedure(s):  EXCISION 2CM RIGHT CHEST WALL MASS    Medications prior to admission:   Prior to Admission medications    Medication Sig Start Date End Date Taking? Authorizing Provider   furosemide (LASIX) 40 MG tablet Take 1 tablet by mouth daily   Yes [provider]   Ascorbic Acid (VITAMIN C PO) Take by mouth daily    [provider]   Potassium Chloride 10 MEQ PACK Take by mouth in the morning and at bedtime    Automatic Reconciliation, Ar   albuterol (PROVENTIL) (2.5 MG/3ML) 0.083% nebulizer solution Inhale 1 each into the lungs as needed 07/25/21   Automatic Reconciliation, Ar   anifrolumab-fnia (SAPHNELO) 300 MG/2ML SOLN IV solution Infuse intravenously monthly    Automatic Reconciliation, Ar   azelastine (ASTELIN) 0.1 % nasal spray 1 spray by Nasal route 2 times daily    Automatic Reconciliation, Ar   busPIRone (BUSPAR) 10 MG tablet Take 1 tablet by mouth 3 times daily 09/22/21   Automatic Reconciliation, Ar   cimetidine (TAGAMET) 400 MG tablet Take 1 tablet by mouth 2 times daily 09/17/21   Automatic Reconciliation, Ar   cyclobenzaprine (FLEXERIL) 10 MG tablet in the morning, at noon, and at bedtime    Automatic Reconciliation, Ar   DULoxetine (CYMBALTA) 60 MG extended release capsule Take 2 capsules by mouth daily 08/28/09   Automatic Reconciliation, Ar   ergocalciferol (ERGOCALCIFEROL) 1.25 MG (50000 UT) capsule Take by mouth every 7 days Mondays 08/28/09   Automatic Reconciliation, Ar   folic acid (FOLVITE) 1 MG tablet TAKE 1 TABLET BY MOUTH EVERY DAY 10/31/19   Automatic Reconciliation, Ar   gabapentin (NEURONTIN) 400 MG capsule Take 2 capsules by mouth daily. ceived the following from [facility]: Outside name:  gabapentin (NEURONTIN) 400 mg capsule 02/22/21   Automatic Reconciliation, Ar   HYDROcodone-acetaminophen (NORCO) 10-325 MG per tablet Take 2 tablets by mouth 4 times daily.    Automatic Reconciliation, Ar   hydroxychloroquine (PLAQUENIL) 200 MG tablet Take by mouth 2 times daily    Automatic Reconciliation, Ar   hydrOXYzine HCl (ATARAX) 10 MG tablet Take 2 tablets by mouth as needed    Automatic Reconciliation, Ar   meloxicam (MOBIC) 15 MG tablet Take by mouth daily    Automatic Reconciliation, Ar   nortriptyline (PAMELOR) 25 MG capsule Take by mouth 05/09/21   Automatic Reconciliation, Ar   prednisoLONE acetate (PRED FORTE) 1 % ophthalmic suspension Apply 1 drop to eye 08/28/09   Automatic Reconciliation, Ar   predniSONE (DELTASONE) 5 MG tablet TAKE 1-2 TABLETS BY MOUTH EVERY MORNING 03/29/21   Automatic Reconciliation, Ar   rizatriptan (MAXALT) 10 MG tablet TAKE 1 TABLET (10 MG) BY ORAL ROUTE ONCE, MAY REPEAT AFTER 2 HOURS. NO MORE THAN 2/DAY 04/02/21   Automatic Reconciliation, Ar   topiramate (TOPAMAX) 200 MG tablet Take 1 tablet by mouth 2 times daily  Automatic Reconciliation, Ar   triamterene-hydroCHLOROthiazide (DYAZIDE) 37.5-25 MG per capsule Take 1 capsule by mouth daily  Patient not taking: Reported on 11/17/2022    Automatic Reconciliation, Ar   valACYclovir (VALTREX) 1 g tablet Take 0.5 tablets by mouth daily 08/28/09   Automatic Reconciliation, Ar       Current medications:    Current Facility-Administered Medications   Medication Dose Route Frequency Provider Last Rate Last Admin   . lactated ringers IV soln infusion   IntraVENous Once Joyice Faster, MD       . ceFAZolin (ANCEF) 2,000 mg in sterile water 20 mL IV syringe  2,000 mg IntraVENous Once Luellen Pucker, MD           Allergies:    Allergies   Allergen Reactions   . Morphine Other (See Comments)     "it does not stop the pain"   . Penicillins Rash and Swelling       Problem List:    Patient Active Problem List   Diagnosis Code   . LLQ pain  R10.32   . Lumbar post-laminectomy syndrome M96.1   . Adjustment disorder with mixed anxiety and depressed mood F43.23   . Benign essential tremor syndrome G25.0   . Lumbar radiculopathy M54.16   . B12 deficiency E53.8   . Osteoarthritis of right knee M17.11   . Disturbance of memory R41.3   . Burning sensation of feet R20.8   . Lupus ([place]) M32.9   . Palpitations R00.2   . Carpal tunnel syndrome of left wrist G56.02   . Cervical radiculopathy due to degenerative joint disease of spine M47.22   . Ulnar neuropathy at elbow of right upper extremity G56.21   . Tremors of nervous system R25.1   . 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 Lumbar back pain with radiculopathy affecting left lower extremity M54.16   . ADHD (attention deficit hyperactivity disorder), inattentive type F90.0   . Chronic head pain R51.9, G89.29   . Bilateral carotid artery stenosis I65.23   . Convulsions ([place]) R56.9   . Vitamin D deficiency E55.9   . Diabetic peripheral neuropathy (HCC) E11.42   . Lumbar back pain with radiculopathy affecting right lower extremity M54.16   . Limb pain M79.609   . S/P total knee replacement, right Z96.651   . Syncope and collapse R55   . Localized swelling, mass and lump, trunk R22.2       Past Medical History:        Diagnosis Date   . Adverse effect of anesthesia     hard time breathing after surgery, sister hard to wake up   . Arthritis    . Burning with urination    . Cancer ([place])     skin CA on back   . Chronic obstructive pulmonary disease (HCC)     mild per pt   . Depression    . Diarrhea    . Diverticulosis    . Dizziness    . Easy bruising    . Fibromyalgia     chronic pain   . Gentamicin ototoxicity, right     no balance on this side of her body   . GERD (gastroesophageal reflux disease)    . Headache(784.0)    . Hearing loss     bilateral   . IBS (irritable bowel syndrome)    . Ill-defined condition     past hx of shingles--eye/ear 13 years ago   .  Lupus ([place])    . Meniere disease     . Night sweats    . Numbness     hand and feet   . Rheumatoid arthritis, unspecified ([place])    . Sleep apnea     did not tolerate cpap   . Sweats, menopausal    . Visual loss     right       Past Surgical History:        Procedure Laterality Date   . APPENDECTOMY     . BREAST BIOPSY Right     benign core bx   . CARPAL TUNNEL RELEASE Right 01/2014    with trigger finger   . CHOLECYSTECTOMY     . COLONOSCOPY     . COLONOSCOPY N/A 09/15/2018    COLONOSCOPY performed by Christin Bach, MD at [facility]   . CORNEAL TRANSPLANT Right    . GYN      hysterectomy   . HEENT      two ear surgeries to try to fix meniere's disease, second one they used gentimycin, ruined her balance always dizzy   . HERNIA REPAIR      left, incisional after first bladder repair   . [place]    back x2, no hardware   . OTHER SURGICAL HISTORY  11/18/2021    EXCISION 3CM RIGHT CHEST WALL MASS   . TONSILLECTOMY     . TOTAL COLECTOMY  1994    1 foot of colon removed   . TOTAL KNEE ARTHROPLASTY Right 2019   . UPPER GASTROINTESTINAL ENDOSCOPY      with dilatation   . UROLOGICAL SURGERY      bladder surgery x2       Social History:    Social History     Tobacco Use   . Smoking status: Former     Current packs/day: 0.00     Average packs/day: 0.5 packs/day for 40.0 years (20.0 ttl pk-yrs)     Types: Cigarettes     Start date: 42     Quit date: 2015     Years since quitting: 9.2   . Smokeless tobacco: Never   Substance Use Topics   . Alcohol use: Yes     Comment: very seldom                                Counseling given: Not Answered      Vital Signs (Current):   Vitals:    11/17/22 1639 11/20/22 1243   BP:  (!) 141/76   Pulse:  78   Resp:  13   Temp:  98.9 F (37.2 C)   TempSrc:  Oral   SpO2:  98%   Weight: 96.6 kg (213 lb) 99.3 kg (218 lb 14.7 oz)   Height: 1.676 m (5\' 6" ) 1.676 m (5\' 6" )                                              BP Readings from Last 3 Encounters:   11/20/22 (!) 141/76   11/10/22 117/78   08/22/22  128/70       NPO Status: Time of last liquid consumption: 2345  Time of last solid consumption: 1500                        Date of last liquid consumption: 11/19/22                        Date of last solid food consumption: 11/19/22    BMI:   Wt Readings from Last 3 Encounters:   11/20/22 99.3 kg (218 lb 14.7 oz)   11/10/22 98.2 kg (216 lb 9.6 oz)   08/22/22 96.4 kg (212 lb 8.4 oz)     Body mass index is 35.33 kg/m.    CBC:   Lab Results   Component Value Date/Time    WBC 4.8 08/22/2022 08:25 AM    RBC 4.45 08/22/2022 08:25 AM    HGB 14.5 08/22/2022 08:25 AM    HCT 44.0 08/22/2022 08:25 AM    MCV 98.9 08/22/2022 08:25 AM    RDW 14.1 08/22/2022 08:25 AM    PLT 240 08/22/2022 08:25 AM       CMP:   Lab Results   Component Value Date/Time    NA 140 08/22/2022 08:25 AM    K 3.7 08/22/2022 08:25 AM    CL 104 08/22/2022 08:25 AM    CO2 30 08/22/2022 08:25 AM    BUN 13 08/22/2022 08:25 AM    CREATININE 0.90 08/22/2022 08:25 AM    GFRAA >60 10/31/2020 03:07 PM    AGRATIO 1.0 08/22/2022 08:25 AM    AGRATIO 0.7 03/24/2020 05:50 PM    LABGLOM >60 08/22/2022 08:25 AM    GLUCOSE 99 08/22/2022 08:25 AM    PROT 8.0 08/22/2022 08:25 AM    CALCIUM 9.9 08/22/2022 08:25 AM    BILITOT 0.4 08/22/2022 08:25 AM    ALKPHOS 79 08/22/2022 08:25 AM    ALKPHOS 83 03/24/2020 05:50 PM    AST 45 08/22/2022 08:25 AM    ALT 23 08/22/2022 08:25 AM       POC Tests: No results for input(s): "POCGLU", "POCNA", "POCK", "POCCL", "POCBUN", "POCHEMO", "POCHCT" in the last 72 hours.    Coags:   Lab Results   Component Value Date/Time    PROTIME 10.1 08/22/2022 08:25 AM    INR 1.0 08/22/2022 08:25 AM       HCG (If Applicable): No results found for: "PREGTESTUR", "PREGSERUM", "HCG", "HCGQUANT"     ABGs: No results found for: "PHART", "PO2ART", "PCO2ART", "HCO3ART", "BEART", "O2SATART"     Type & Screen (If Applicable):  No results found for: "LABABO", "LABRH"    Drug/Infectious Status (If Applicable):  No results found for: "HIV",  "HEPCAB"    COVID-19 Screening (If Applicable):   Lab Results   Component Value Date/Time    COVID19 Not detected 03/24/2020 05:50 PM           Anesthesia Evaluation  Patient summary reviewed and Nursing notes reviewed   history of anesthetic complications:   Airway: Mallampati: II  TM distance: >3 FB   Neck ROM: full  Mouth opening: > = 3 FB   Dental:    (+) caps      Pulmonary:normal exam    (+)  COPD:    sleep apnea (did not tolerate cpap):       current smoker (20 pack years)                           Cardiovascular:  ECG reviewed                     ROS comment:   ECG (08/22/22):  Normal sinus rhythm   Inferior infarct , age undetermined   Cannot rule out Anterior infarct , age undetermined   When compared with ECG of 20-Apr-2017 08:27,   Inferior infarct is now present        Neuro/Psych:   (+) neuromuscular disease (Fibromyalgia):, headaches:, psychiatric history:depression/anxiety              ROS comment: Cervical radiculopathy due to degenerative joint disease of spine  Lumbar post-laminectomy syndrome      Meniere's disease  Hearing loss GI/Hepatic/Renal:   (+) GERD:, morbid obesity         ROS comment: IBS (irritable bowel syndrome)    Diverticulosis    Hx Colectomy  Hx Appendectomy  Hx Cholecystectomy  .   Endo/Other:    (+) : arthritis: rheumatoid..    (-) diabetes mellitus                ROS comment: Right chest wall mass    Lupus    Hx Right total knee arthroplasty   Abdominal:   (+) obese          Vascular:          Other Findings:       Anesthesia Plan      MAC     ASA 3       Induction: intravenous.    MIPS: Postoperative opioids intended and Prophylactic antiemetics administered.  Anesthetic plan and risks discussed with patient.      Plan discussed with CRNA.                Marlene Lard, MD   11/20/2022

## 2022-11-20 NOTE — Interval H&P Note (Signed)
Update History & Physical    The patient's History and Physical of November 10, 2022 was reviewed with the patient and I examined the patient. There was no change. The surgical site was confirmed by the patient and me.     Plan: The risks, benefits, expected outcome, and alternative to the recommended procedure have been discussed with the patient. Patient understands and wants to proceed with the procedure.     Electronically signed by Jearl Klinefelter, MD on 11/20/2022 at 12:25 PM

## 2022-11-20 NOTE — Op Note (Signed)
[place]  [address]                            OPERATIVE REPORT      PATIENT NAME: Katherine, [name]           DOB: 1956/03/31  MED REC NO: [id number]                       ROOM: OR  ACCOUNT NO: [id number]                       ADMIT DATE: 11/20/2022  PROVIDER: Luellen Pucker, MD    DATE OF SERVICE:  11/20/2022    PREOPERATIVE DIAGNOSES:  Right chest wall mass, 2 cm.    POSTOPERATIVE DIAGNOSES:  Right chest wall mass, 2 cm.    PROCEDURES PERFORMED:  Excision of 2 cm right chest wall mass.    SURGEON:  Luellen Pucker, MD    ASSISTANT:  Alfredo Bach.    ANESTHESIA:  MAC.    ESTIMATED BLOOD LOSS:  Minimal.    SPECIMENS REMOVED:  Right chest wall mass.    INTRAOPERATIVE FINDINGS:  mass     COMPLICATIONS:  None.    IMPLANTS:  None.    INDICATIONS:  chest wall mass    FINDINGS:  Firm hard nodular mass.    DRAINS:  None.    BRIEF HISTORY:  Katherine [name] is a pleasant 67 year old with right chest wall mass that is bothersome for her.  She previously had a lipoma removed in the region.  It is unclear whether this is recurrence or something different.    DESCRIPTION OF PROCEDURE:  The patient was taken to the operating room and placed on the operating table in the supine position and underwent IV sedation, and the right chest wall was prepped and draped in usual sterile fashion.  After appropriate time-out and antibiotics were given, 1% lidocaine with epinephrine was infiltrated into skin and subcutaneous tissue over the lesion.  An ellipse of skin was taken utilizing electrocautery to excise the entire area all the way down to the pectoralis musculature without any significant bleeding.  Interrupted 3-0 Vicryl was then used to approximate the deep tissues and deep dermis, and running 4-0 Vicryl was used to close the skin, and Dermabond dressing was applied.  Upon completion of the operation, the needle, sponge, and instrument  counts were correct x2.  The patient tolerated the procedure well, was brought to the recovery room in stable condition.                Luellen Pucker, MD      MJM/AQS  D:  11/20/2022 15:13:55  T:  11/21/2022 00:25:16  JOB #:  [id number]    CC:   Luellen Pucker, MD

## 2022-12-08 ENCOUNTER — Ambulatory Visit: Payer: MEDICARE | Attending: Surgery | Primary: Family Medicine

## 2022-12-10 ENCOUNTER — Ambulatory Visit: Payer: MEDICARE | Attending: Surgery | Primary: Family Medicine

## 2022-12-10 NOTE — Telephone Encounter (Signed)
Patient requested to reschedule md running behind on 4.17.24 at her po appt, next available not until May was not able to reschedule to Friday 3.19.24 is busy    Please advise  [phone]

## 2022-12-11 NOTE — Telephone Encounter (Signed)
Returned call to patient. Offered the appointment for Friday, she did not accept. Advised the next opening is May 10, she prefers to be called back with a cancellation opening. Patient says that she is doing well.

## 2023-02-23 ENCOUNTER — Inpatient Hospital Stay: Admit: 2023-02-23 | Payer: Medicare Other | Primary: Family Medicine

## 2023-02-23 ENCOUNTER — Encounter

## 2023-02-23 DIAGNOSIS — F1190 Opioid use, unspecified, uncomplicated: Secondary | ICD-10-CM

## 2023-03-06 ENCOUNTER — Encounter

## 2023-04-09 ENCOUNTER — Ambulatory Visit: Payer: MEDICARE | Primary: Family Medicine

## 2023-04-09 DIAGNOSIS — Z1231 Encounter for screening mammogram for malignant neoplasm of breast: Secondary | ICD-10-CM

## 2023-05-21 ENCOUNTER — Encounter

## 2023-06-10 ENCOUNTER — Ambulatory Visit: Payer: MEDICARE | Primary: Family Medicine

## 2023-06-17 ENCOUNTER — Inpatient Hospital Stay: Admit: 2023-06-17 | Payer: MEDICARE | Attending: Specialist | Primary: Family Medicine

## 2023-06-17 DIAGNOSIS — M48062 Spinal stenosis, lumbar region with neurogenic claudication: Secondary | ICD-10-CM

## 2023-06-17 MED ORDER — GADOTERIDOL 279.3 MG/ML IV SOLN
279.3 | Freq: Once | INTRAVENOUS | Status: AC | PRN
Start: 2023-06-17 — End: 2023-06-17
  Administered 2023-06-17: 17:00:00 20 mL via INTRAVENOUS

## 2023-07-09 NOTE — Unmapped (Signed)
 [facility] [place]  [address] [place] [address]  Dept: [phone]  Dept Fax: [phone]    PT Plan of Care  (Physical Therapy Evaluation)      PLEASE SIGN (if received in Epic) OR SIGN IN THE BOX BELOW AND RETURN FAX TO: [phone]

## 2023-07-28 ENCOUNTER — Encounter: Payer: MEDICARE | Primary: Family Medicine

## 2023-07-28 ENCOUNTER — Ambulatory Visit
Admit: 2023-07-28 | Discharge: 2023-08-01 | Disposition: A | Discharge status: Home / Self Care | Payer: MEDICARE | Source: Ambulatory Visit | Attending: Orthopaedic Surgery | Admitting: Orthopaedic Surgery | Primary: Family Medicine

## 2023-07-28 VITALS — BP 135/72 | HR 86 | Temp 98.70000°F | Resp 20 | Ht 66.0 in | Wt 221.1 lb

## 2023-07-28 DIAGNOSIS — Z01818 Encounter for other preprocedural examination: Secondary | ICD-10-CM

## 2023-07-28 LAB — CBC
Hematocrit: 41.9 % (ref 35.0–47.0)
Hemoglobin: 13.7 g/dL (ref 11.5–16.0)
MCH: 31.6 pg (ref 26.0–34.0)
MCHC: 32.7 g/dL (ref 30.0–36.5)
MCV: 96.8 fL (ref 80.0–99.0)
MPV: 8.9 fL (ref 8.9–12.9)
Nucleated RBCs: 0 /100{WBCs}
Platelets: 255 10*3/uL (ref 150–400)
RBC: 4.33 M/uL (ref 3.80–5.20)
RDW: 12.7 % (ref 11.5–14.5)
WBC: 5.5 10*3/uL (ref 3.6–11.0)
nRBC: 0 10*3/uL (ref 0.00–0.01)

## 2023-07-28 LAB — PROTIME-INR
INR: 0.9 (ref 0.9–1.1)
Protime: 9.6 s (ref 9.0–11.1)

## 2023-07-28 LAB — URINALYSIS WITH REFLEX TO CULTURE
BACTERIA, URINE: NEGATIVE /[HPF]
Bilirubin, Urine: NEGATIVE
Blood, Urine: NEGATIVE
Glucose, Ur: NEGATIVE mg/dL
Ketones, Urine: NEGATIVE mg/dL
Leukocyte Esterase, Urine: NEGATIVE
Nitrite, Urine: NEGATIVE
Protein, UA: NEGATIVE mg/dL
Specific Gravity, UA: 1.005
Urobilinogen, Urine: 0.2 U/dL (ref 0.2–1.0)
pH, Urine: 5.5 (ref 5.0–8.0)

## 2023-07-28 LAB — COMPREHENSIVE METABOLIC PANEL
ALT: 22 U/L (ref 12–78)
AST: 49 U/L — ABNORMAL HIGH (ref 15–37)
Albumin/Globulin Ratio: 0.9 — ABNORMAL LOW (ref 1.1–2.2)
Albumin: 3.7 g/dL (ref 3.5–5.0)
Alk Phosphatase: 92 U/L (ref 45–117)
Anion Gap: 4 mmol/L (ref 2–12)
BUN/Creatinine Ratio: 11 — ABNORMAL LOW (ref 12–20)
BUN: 9 mg/dL (ref 6–20)
CO2: 28 mmol/L (ref 21–32)
Calcium: 8.9 mg/dL (ref 8.5–10.1)
Chloride: 109 mmol/L — ABNORMAL HIGH (ref 97–108)
Creatinine: 0.85 mg/dL (ref 0.55–1.02)
Est, Glom Filt Rate: 74 mL/min/{1.73_m2} (ref >60)
Globulin: 4 g/dL (ref 2.0–4.0)
Glucose: 107 mg/dL — ABNORMAL HIGH (ref 65–100)
Potassium: 3.5 mmol/L (ref 3.5–5.1)
Sodium: 141 mmol/L (ref 136–145)
Total Bilirubin: 0.2 mg/dL (ref 0.2–1.0)
Total Protein: 7.7 g/dL (ref 6.4–8.2)

## 2023-07-28 LAB — HEMOGLOBIN A1C
Estimated Avg Glucose: 97 mg/dL
Hemoglobin A1C: 5 % (ref 4.0–5.6)

## 2023-07-28 LAB — TYPE AND SCREEN
ABO/Rh: A POS
Antibody Screen: NEGATIVE

## 2023-07-28 NOTE — Progress Notes (Addendum)
 [facility]  Physical Therapy Pre-surgery evaluation  [address]  [place], [place] [address]    PHYSICAL THERAPY PRE THR SURGERY EVALUATION    Date: 07/28/2023  Patient: Katherine [name] (67 y.o. female)  DOB: May 21, 1956  Medical Dia

## 2023-07-28 NOTE — Progress Notes (Signed)
 Hibiclens/Chlorhexidine    Preventing Infections Before and After - Your Surgery    IMPORTANT INSTRUCTIONS    Please read and follow these instructions carefully. If you are unable to comply with the below instructions your procedure will be cancelled.

## 2023-07-28 NOTE — Progress Notes (Signed)
 Incentive Spirometer        Using the incentive spirometer helps expand the small air sacs of your lungs, helps you breathe deeply, and helps improve your lung function.  Use your incentive spirometer twice a day (10 breaths each time) prior to surgery.

## 2023-07-28 NOTE — Progress Notes (Signed)
 [facility] "Your Path to a More Active Life" orthopedic total knee or total hip educational video and the [facility] patient handbook provided & reviewed during the patients pre-admis

## 2023-07-28 NOTE — Progress Notes (Addendum)
 RW ordered through [facility] for delivery today during PAT visit per PT request for patient safety.  Awaiting approval.  Update @ 1500: RW approved.  Call to patient who states will pick up tomorrow morning 12/4, at PAT.  Patient given RW by A.Palmo

## 2023-07-28 NOTE — Progress Notes (Signed)
 Orthopedic and Spine Patients:  Instructions on When You Can   Eat or Drink Before Surgery      You have been provided 2 pre-surgery drinks received at your pre-admission testing appointment.    Night before surgery:  You should drink one bottle of the  pr

## 2023-07-28 NOTE — Progress Notes (Signed)
 [facility]  Joint/Spine Preoperative Instructions        Surgery Date 08/05/23          Time of Arrival to be called 08/04/23 between 2-5 pm on [phone]     1. On the day of your surgery, please report to the Surgical

## 2023-07-29 LAB — CULTURE, MRSA, SCREENING

## 2023-07-29 LAB — EKG 12-LEAD
Atrial Rate: 86 {beats}/min
Diagnosis: NORMAL
P Axis: 46 degrees
P-R Interval: 162 ms
Q-T Interval: 390 ms
QRS Duration: 98 ms
QTc Calculation (Bazett): 466 ms
R Axis: 33 degrees
T Axis: 31 degrees
Ventricular Rate: 86 {beats}/min

## 2023-08-04 NOTE — Discharge Instructions (Signed)
 Discharge Instructions:  Katherine [name]    Surgery: TOTAL HIP REPLACEMENT.        To relieve pain:  Use ice/gel packs.    -Put the ice pack directly over the wound, or anywhere you are hurting or swollen.   -To control pain and swelling, keep ice on

## 2023-08-05 ENCOUNTER — Inpatient Hospital Stay
Admit: 2023-08-05 | Discharge: 2023-08-10 | Disposition: A | Discharge status: Skilled Nursing Facility | Payer: MEDICARE | Attending: Orthopaedic Surgery | Admitting: Orthopaedic Surgery

## 2023-08-05 ENCOUNTER — Ambulatory Visit: Admit: 2023-08-05 | Payer: MEDICARE | Primary: Family Medicine

## 2023-08-05 DIAGNOSIS — M1611 Unilateral primary osteoarthritis, right hip: Secondary | ICD-10-CM

## 2023-08-05 DIAGNOSIS — Z96641 Presence of right artificial hip joint: Secondary | ICD-10-CM

## 2023-08-05 MED ORDER — NORMAL SALINE FLUSH 0.9 % IV SOLN
0.9 | INTRAVENOUS | Status: DC | PRN
Start: 2023-08-05 — End: 2023-08-10

## 2023-08-05 MED ORDER — LACTATED RINGERS IV SOLN
INTRAVENOUS | Status: DC
Start: 2023-08-05 — End: 2023-08-05

## 2023-08-05 MED ORDER — PROCHLORPERAZINE EDISYLATE 10 MG/2ML IJ SOLN
10 | Freq: Once | INTRAMUSCULAR | Status: AC | PRN
Start: 2023-08-05 — End: 2023-08-05
  Administered 2023-08-05: 19:00:00 5 mg via INTRAVENOUS

## 2023-08-05 MED ORDER — LACTATED RINGERS IV SOLN
INTRAVENOUS | Status: DC | PRN
Start: 2023-08-05 — End: 2023-08-05
  Administered 2023-08-05 (×2): via INTRAVENOUS

## 2023-08-05 MED ORDER — HYDRALAZINE HCL 20 MG/ML IJ SOLN
20 | INTRAMUSCULAR | Status: DC | PRN
Start: 2023-08-05 — End: 2023-08-05

## 2023-08-05 MED ORDER — LIDOCAINE HCL (PF) 2 % IJ SOLN
2 | Freq: Once | INTRAMUSCULAR | Status: DC | PRN
Start: 2023-08-05 — End: 2023-08-05
  Administered 2023-08-05: 16:00:00 80 via INTRAVENOUS

## 2023-08-05 MED ORDER — SUCCINYLCHOLINE CHLORIDE 20 MG/ML IJ SOLN
20 | Freq: Once | INTRAMUSCULAR | Status: DC | PRN
Start: 2023-08-05 — End: 2023-08-05
  Administered 2023-08-05: 16:00:00 160 via INTRAVENOUS

## 2023-08-05 MED ORDER — TOPIRAMATE 100 MG PO TABS
100 | Freq: Two times a day (BID) | ORAL | Status: DC
Start: 2023-08-05 — End: 2023-08-10
  Administered 2023-08-06 – 2023-08-10 (×10): 200 mg via ORAL

## 2023-08-05 MED ORDER — TRANEXAMIC ACID 1000 MG/10ML IV SOLN
1000 | INTRAVENOUS | Status: AC
Start: 2023-08-05 — End: ?

## 2023-08-05 MED ORDER — SODIUM CHLORIDE 0.9 % IV SOLN
0.9 | INTRAVENOUS | Status: DC | PRN
Start: 2023-08-05 — End: 2023-08-05

## 2023-08-05 MED ORDER — DULOXETINE HCL 30 MG PO CPEP
30 | Freq: Two times a day (BID) | ORAL | Status: DC
Start: 2023-08-05 — End: 2023-08-10
  Administered 2023-08-06 – 2023-08-10 (×10): 60 mg via ORAL

## 2023-08-05 MED ORDER — OXYCODONE HCL 5 MG PO TABS
5 | ORAL | Status: DC | PRN
Start: 2023-08-05 — End: 2023-08-10
  Administered 2023-08-06 – 2023-08-10 (×20): 10 mg via ORAL

## 2023-08-05 MED ORDER — SODIUM CHLORIDE (PF) 0.9 % IJ SOLN
0.9 | INTRAMUSCULAR | Status: DC | PRN
Start: 2023-08-05 — End: 2023-08-05

## 2023-08-05 MED ORDER — DIPHENHYDRAMINE HCL 50 MG/ML IJ SOLN
50 | Freq: Four times a day (QID) | INTRAMUSCULAR | Status: DC | PRN
Start: 2023-08-05 — End: 2023-08-10

## 2023-08-05 MED ORDER — ACETAMINOPHEN 500 MG PO TABS
500 | ORAL | Status: AC
Start: 2023-08-05 — End: ?

## 2023-08-05 MED ORDER — MIDAZOLAM HCL (PF) 2 MG/2ML IJ SOLN
2 | Freq: Once | INTRAMUSCULAR | Status: DC | PRN
Start: 2023-08-05 — End: 2023-08-05

## 2023-08-05 MED ORDER — STERILE WATER FOR INJECTION (MIXTURES ONLY)
1 | Freq: Once | INTRAMUSCULAR | Status: AC
Start: 2023-08-05 — End: 2023-08-05
  Administered 2023-08-05: 16:00:00 2000 mg via INTRAVENOUS

## 2023-08-05 MED ORDER — DEXAMETHASONE SODIUM PHOSPHATE 4 MG/ML IJ SOLN
4 | INTRAMUSCULAR | Status: AC
Start: 2023-08-05 — End: ?

## 2023-08-05 MED ORDER — ONDANSETRON HCL 4 MG/2ML IJ SOLN
4 | Freq: Four times a day (QID) | INTRAMUSCULAR | Status: DC | PRN
Start: 2023-08-05 — End: 2023-08-10

## 2023-08-05 MED ORDER — PROPOFOL 1000 MG/100ML IV EMUL
1000 | INTRAVENOUS | Status: AC
Start: 2023-08-05 — End: ?

## 2023-08-05 MED ORDER — SODIUM CHLORIDE 0.9 % IV BOLUS
0.9 | Freq: Once | INTRAVENOUS | Status: DC | PRN
Start: 2023-08-05 — End: 2023-08-10

## 2023-08-05 MED ORDER — ONDANSETRON HCL 4 MG/2ML IJ SOLN
4 | INTRAMUSCULAR | Status: AC
Start: 2023-08-05 — End: ?

## 2023-08-05 MED ORDER — ACETAMINOPHEN 500 MG PO TABS
500 | Freq: Once | ORAL | Status: DC
Start: 2023-08-05 — End: 2023-08-05

## 2023-08-05 MED ORDER — BISACODYL 10 MG RE SUPP
10 | Freq: Every day | RECTAL | Status: DC | PRN
Start: 2023-08-05 — End: 2023-08-10

## 2023-08-05 MED ORDER — PROPOFOL 1000 MG/100ML IV EMUL
1000 | Freq: Once | INTRAVENOUS | Status: DC | PRN
Start: 2023-08-05 — End: 2023-08-05
  Administered 2023-08-05: 16:00:00 140 via INTRAVENOUS
  Administered 2023-08-05 (×2): 30 via INTRAVENOUS

## 2023-08-05 MED ORDER — NOZIN NASAL SANITIZER 62 % NA KIT
62 | Freq: Once | NASAL | Status: AC
Start: 2023-08-05 — End: 2023-08-05
  Administered 2023-08-05: 16:00:00 3 via NASAL

## 2023-08-05 MED ORDER — NORMAL SALINE FLUSH 0.9 % IV SOLN
0.9 | INTRAVENOUS | Status: DC | PRN
Start: 2023-08-05 — End: 2023-08-05

## 2023-08-05 MED ORDER — ROPIVACAINE HCL 5 MG/ML IJ SOLN
INTRAMUSCULAR | Status: AC
Start: 2023-08-05 — End: 2023-08-05
  Administered 2023-08-05: 16:00:00 20 via PERINEURAL

## 2023-08-05 MED ORDER — FUROSEMIDE 40 MG PO TABS
40 | Freq: Every day | ORAL | Status: DC
Start: 2023-08-05 — End: 2023-08-10
  Administered 2023-08-06 – 2023-08-10 (×4): 40 mg via ORAL

## 2023-08-05 MED ORDER — POLYETHYLENE GLYCOL 3350 17 G PO PACK
17 | Freq: Every day | ORAL | Status: DC
Start: 2023-08-05 — End: 2023-08-10
  Administered 2023-08-06 – 2023-08-10 (×4): 17 g via ORAL

## 2023-08-05 MED ORDER — OXYCODONE HCL 5 MG PO TABS
5 | ORAL | Status: DC | PRN
Start: 2023-08-05 — End: 2023-08-10
  Administered 2023-08-05 – 2023-08-08 (×3): 5 mg via ORAL

## 2023-08-05 MED ORDER — NORMAL SALINE FLUSH 0.9 % IV SOLN
0.9 | Freq: Two times a day (BID) | INTRAVENOUS | Status: DC
Start: 2023-08-05 — End: 2023-08-05

## 2023-08-05 MED ORDER — SODIUM CHLORIDE 0.9 % IV SOLN
0.9 | INTRAVENOUS | Status: DC | PRN
Start: 2023-08-05 — End: 2023-08-05
  Administered 2023-08-05: 17:00:00 30 via INTRAVENOUS
  Administered 2023-08-05 (×2): 80 via INTRAVENOUS
  Administered 2023-08-05: 17:00:00 40 via INTRAVENOUS

## 2023-08-05 MED ORDER — OXYCODONE HCL 5 MG PO TABS
5 | Freq: Once | ORAL | Status: DC | PRN
Start: 2023-08-05 — End: 2023-08-05

## 2023-08-05 MED ORDER — DIPHENHYDRAMINE HCL 25 MG PO CAPS
25 | Freq: Four times a day (QID) | ORAL | Status: DC | PRN
Start: 2023-08-05 — End: 2023-08-10

## 2023-08-05 MED ORDER — TRANEXAMIC ACID 1000 MG/10ML IV SOLN
1000 | Freq: Once | INTRAVENOUS | Status: DC | PRN
Start: 2023-08-05 — End: 2023-08-05
  Administered 2023-08-05 (×2): 1000 via INTRAVENOUS

## 2023-08-05 MED ORDER — ROPIVACAINE HCL 5 MG/ML IJ SOLN
INTRAMUSCULAR | Status: AC
Start: 2023-08-05 — End: ?

## 2023-08-05 MED ORDER — LIDOCAINE HCL (PF) 2 % IJ SOLN
2 | INTRAMUSCULAR | Status: AC
Start: 2023-08-05 — End: ?

## 2023-08-05 MED ORDER — GABAPENTIN 300 MG PO CAPS
300 | Freq: Every evening | ORAL | Status: DC
Start: 2023-08-05 — End: 2023-08-10
  Administered 2023-08-06: 02:00:00 600 mg via ORAL
  Administered 2023-08-07 – 2023-08-10 (×4): 1200 mg via ORAL

## 2023-08-05 MED ORDER — DEXAMETHASONE SODIUM PHOSPHATE 4 MG/ML IJ SOLN
4 | Freq: Once | INTRAMUSCULAR | Status: DC | PRN
Start: 2023-08-05 — End: 2023-08-05
  Administered 2023-08-05: 16:00:00 4 via INTRAVENOUS

## 2023-08-05 MED ORDER — STERILE WATER FOR INJECTION (MIXTURES ONLY)
1 g | Freq: Three times a day (TID) | INTRAMUSCULAR | Status: AC
Start: 2023-08-05 — End: 2023-08-06
  Administered 2023-08-05 – 2023-08-06 (×2): 2000 mg via INTRAVENOUS

## 2023-08-05 MED ORDER — HYDROMORPHONE HCL PF 1 MG/ML IJ SOLN
1 | INTRAMUSCULAR | Status: DC | PRN
Start: 2023-08-05 — End: 2023-08-05
  Administered 2023-08-05: 18:00:00 0.25 mg via INTRAVENOUS

## 2023-08-05 MED ORDER — ACETAMINOPHEN 500 MG PO TABS
500 | Freq: Three times a day (TID) | ORAL | Status: DC
Start: 2023-08-05 — End: 2023-08-10
  Administered 2023-08-05 – 2023-08-10 (×15): 1000 mg via ORAL

## 2023-08-05 MED ORDER — CEFAZOLIN SODIUM 1 G IJ SOLR
1 | INTRAMUSCULAR | Status: AC
Start: 2023-08-05 — End: ?

## 2023-08-05 MED ORDER — PREDNISOLONE ACETATE 1 % OP SUSP
1 | Freq: Every day | OPHTHALMIC | Status: DC
Start: 2023-08-05 — End: 2023-08-10
  Administered 2023-08-06 – 2023-08-10 (×5): 1 [drp] via OPHTHALMIC

## 2023-08-05 MED ORDER — ONDANSETRON 4 MG PO TBDP
4 | Freq: Three times a day (TID) | ORAL | Status: DC | PRN
Start: 2023-08-05 — End: 2023-08-10
  Administered 2023-08-07 – 2023-08-08 (×3): 4 mg via ORAL

## 2023-08-05 MED ORDER — KETOROLAC TROMETHAMINE 30 MG/ML IJ SOLN
30 | Freq: Four times a day (QID) | INTRAMUSCULAR | Status: AC
Start: 2023-08-05 — End: 2023-08-06
  Administered 2023-08-05 – 2023-08-06 (×4): 15 mg via INTRAVENOUS

## 2023-08-05 MED ORDER — FENTANYL CITRATE (PF) 100 MCG/2ML IJ SOLN
100 | INTRAMUSCULAR | Status: DC | PRN
Start: 2023-08-05 — End: 2023-08-05

## 2023-08-05 MED ORDER — ACETAMINOPHEN 325 MG PO TABS
325 | Freq: Once | ORAL | Status: DC | PRN
Start: 2023-08-05 — End: 2023-08-05

## 2023-08-05 MED ORDER — FENTANYL CITRATE (PF) 100 MCG/2ML IJ SOLN
100 | Freq: Once | INTRAMUSCULAR | Status: DC | PRN
Start: 2023-08-05 — End: 2023-08-05

## 2023-08-05 MED ORDER — ACETAMINOPHEN 500 MG PO TABS
500 | Freq: Once | ORAL | Status: AC
Start: 2023-08-05 — End: 2023-08-05
  Administered 2023-08-05: 16:00:00 1000 mg via ORAL

## 2023-08-05 MED ORDER — CELECOXIB 200 MG PO CAPS
200 | ORAL | Status: AC
Start: 2023-08-05 — End: ?

## 2023-08-05 MED ORDER — HYDROMORPHONE HCL 1 MG/ML IJ SOLN
1 | Freq: Once | INTRAMUSCULAR | Status: DC | PRN
Start: 2023-08-05 — End: 2023-08-05
  Administered 2023-08-05 (×2): .25 via INTRAVENOUS

## 2023-08-05 MED ORDER — PROPOFOL 200 MG/20ML IV EMUL
200 | INTRAVENOUS | Status: AC
Start: 2023-08-05 — End: ?

## 2023-08-05 MED ORDER — SENNA-DOCUSATE SODIUM 8.6-50 MG PO TABS
8.6-50 | Freq: Two times a day (BID) | ORAL | Status: DC
Start: 2023-08-05 — End: 2023-08-10
  Administered 2023-08-06 – 2023-08-10 (×9): 1 via ORAL

## 2023-08-05 MED ORDER — FENTANYL CITRATE (PF) 100 MCG/2ML IJ SOLN
100 | INTRAMUSCULAR | Status: AC
Start: 2023-08-05 — End: ?

## 2023-08-05 MED ORDER — TRANEXAMIC ACID 1000 MG/10ML IV SOLN
1000 | INTRAVENOUS | Status: DC | PRN
Start: 2023-08-05 — End: 2023-08-05
  Administered 2023-08-05: 17:00:00 1000 via INTRAVENOUS

## 2023-08-05 MED ORDER — HYDROMORPHONE HCL 1 MG/ML IJ SOLN
1 | INTRAMUSCULAR | Status: AC
Start: 2023-08-05 — End: ?

## 2023-08-05 MED ORDER — NORMAL SALINE FLUSH 0.9 % IV SOLN
0.9 | Freq: Two times a day (BID) | INTRAVENOUS | Status: DC
Start: 2023-08-05 — End: 2023-08-10
  Administered 2023-08-06 – 2023-08-10 (×8): 10 mL via INTRAVENOUS

## 2023-08-05 MED ORDER — EPHEDRINE SULFATE-NACL 50-0.9 MG/5ML-% IV SOSY
50-0.9 | Freq: Once | INTRAVENOUS | Status: DC | PRN
Start: 2023-08-05 — End: 2023-08-05
  Administered 2023-08-05: 17:00:00 15 via INTRAVENOUS
  Administered 2023-08-05: 17:00:00 10 via INTRAVENOUS

## 2023-08-05 MED ORDER — ASPIRIN 81 MG PO TBEC
81 | Freq: Two times a day (BID) | ORAL | Status: DC
Start: 2023-08-05 — End: 2023-08-10
  Administered 2023-08-06 – 2023-08-10 (×10): 81 mg via ORAL

## 2023-08-05 MED ORDER — HYDROMORPHONE HCL PF 1 MG/ML IJ SOLN
1 | INTRAMUSCULAR | Status: DC | PRN
Start: 2023-08-05 — End: 2023-08-10
  Administered 2023-08-08: 12:00:00 0.25 mg via INTRAVENOUS

## 2023-08-05 MED ORDER — ONDANSETRON HCL 4 MG/2ML IJ SOLN
4 | Freq: Once | INTRAMUSCULAR | Status: DC
Start: 2023-08-05 — End: 2023-08-05

## 2023-08-05 MED ORDER — ALBUTEROL SULFATE (2.5 MG/3ML) 0.083% IN NEBU
RESPIRATORY_TRACT | Status: DC | PRN
Start: 2023-08-05 — End: 2023-08-10

## 2023-08-05 MED ORDER — SODIUM CHLORIDE 0.9 % IV SOLN
0.9 | INTRAVENOUS | Status: DC
Start: 2023-08-05 — End: 2023-08-10

## 2023-08-05 MED ORDER — ROPIVACAINE HCL 5 MG/ML IJ SOLN
INTRAMUSCULAR | Status: DC | PRN
Start: 2023-08-05 — End: 2023-08-05
  Administered 2023-08-05: 17:00:00 30 via PERINEURAL

## 2023-08-05 MED ORDER — ONDANSETRON HCL 4 MG/2ML IJ SOLN
4 | Freq: Once | INTRAMUSCULAR | Status: DC | PRN
Start: 2023-08-05 — End: 2023-08-05
  Administered 2023-08-05: 17:00:00 4 via INTRAVENOUS

## 2023-08-05 MED ORDER — SODIUM CHLORIDE (PF) 0.9 % IJ SOLN
0.9 % | Freq: Two times a day (BID) | INTRAMUSCULAR | Status: DC
Start: 2023-08-05 — End: 2023-08-10

## 2023-08-05 MED ORDER — DEXAMETHASONE SODIUM PHOSPHATE 4 MG/ML IJ SOLN
4 | Freq: Once | INTRAMUSCULAR | Status: AC
Start: 2023-08-05 — End: 2023-08-06
  Administered 2023-08-06: 11:00:00 10 mg via INTRAVENOUS

## 2023-08-05 MED ORDER — DEXAMETHASONE SODIUM PHOSPHATE 4 MG/ML IJ SOLN
4 | Freq: Once | INTRAMUSCULAR | Status: DC | PRN
Start: 2023-08-05 — End: 2023-08-05

## 2023-08-05 MED ORDER — CELECOXIB 200 MG PO CAPS
200 | Freq: Once | ORAL | Status: DC
Start: 2023-08-05 — End: 2023-08-05

## 2023-08-05 MED ORDER — BUSPIRONE HCL 10 MG PO TABS
10 | Freq: Three times a day (TID) | ORAL | Status: DC
Start: 2023-08-05 — End: 2023-08-10
  Administered 2023-08-05 – 2023-08-10 (×15): 30 mg via ORAL

## 2023-08-05 MED ORDER — FAMOTIDINE 20 MG PO TABS
20 | Freq: Two times a day (BID) | ORAL | Status: DC
Start: 2023-08-05 — End: 2023-08-10
  Administered 2023-08-06 – 2023-08-10 (×10): 20 mg via ORAL

## 2023-08-05 MED ORDER — FENTANYL CITRATE (PF) 100 MCG/2ML IJ SOLN
100 | Freq: Once | INTRAMUSCULAR | Status: DC | PRN
Start: 2023-08-05 — End: 2023-08-05
  Administered 2023-08-05 (×2): 50 via INTRAVENOUS

## 2023-08-05 MED FILL — TRANEXAMIC ACID 1000 MG/10ML IV SOLN: 1000 MG/10ML | INTRAVENOUS | Qty: 10 | Fill #0

## 2023-08-05 MED FILL — HYDROMORPHONE HCL 1 MG/ML IJ SOLN: 1 MG/ML | INTRAMUSCULAR | Qty: 1 | Fill #0

## 2023-08-05 MED FILL — XYLOCAINE-MPF 2 % IJ SOLN: 2 % | INTRAMUSCULAR | Qty: 5 | Fill #0

## 2023-08-05 MED FILL — ONDANSETRON HCL 4 MG/2ML IJ SOLN: 4 MG/2ML | INTRAMUSCULAR | Qty: 2 | Fill #0

## 2023-08-05 MED FILL — OXYCODONE HCL 5 MG PO TABS: 5 MG | ORAL | Qty: 1 | Fill #0

## 2023-08-05 MED FILL — ACETAMINOPHEN 500 MG PO TABS: 500 MG | ORAL | Qty: 2 | Fill #0

## 2023-08-05 MED FILL — DULOXETINE HCL 30 MG PO CPEP: 30 MG | ORAL | Qty: 2 | Fill #0

## 2023-08-05 MED FILL — BUSPIRONE HCL 10 MG PO TABS: 10 MG | ORAL | Qty: 3 | Fill #0

## 2023-08-05 MED FILL — KETOROLAC TROMETHAMINE 30 MG/ML IJ SOLN: 30 MG/ML | INTRAMUSCULAR | Qty: 1 | Fill #0

## 2023-08-05 MED FILL — FENTANYL CITRATE (PF) 100 MCG/2ML IJ SOLN: 100 MCG/2ML | INTRAMUSCULAR | Qty: 2 | Fill #0

## 2023-08-05 MED FILL — DEXAMETHASONE SODIUM PHOSPHATE 4 MG/ML IJ SOLN: 4 MG/ML | INTRAMUSCULAR | Qty: 1 | Fill #0

## 2023-08-05 MED FILL — FUROSEMIDE 40 MG PO TABS: 40 MG | ORAL | Qty: 1

## 2023-08-05 MED FILL — TRANEXAMIC ACID 1000 MG/10ML IV SOLN: 1000 MG/10ML | INTRAVENOUS | Qty: 40 | Fill #0

## 2023-08-05 MED FILL — DIPRIVAN 1000 MG/100ML IV EMUL: 1000 MG/100ML | INTRAVENOUS | Qty: 100 | Fill #0

## 2023-08-05 MED FILL — NAROPIN 5 MG/ML IJ SOLN: 5 MG/ML | INTRAMUSCULAR | Qty: 30 | Fill #0

## 2023-08-05 MED FILL — CELECOXIB 200 MG PO CAPS: 200 MG | ORAL | Qty: 1 | Fill #0

## 2023-08-05 MED FILL — PROPOFOL 200 MG/20ML IV EMUL: 200 MG/20ML | INTRAVENOUS | Qty: 20 | Fill #0

## 2023-08-05 MED FILL — CEFAZOLIN SODIUM 1 G IJ SOLR: 1 g | INTRAMUSCULAR | Qty: 2000 | Fill #0

## 2023-08-05 MED FILL — PROCHLORPERAZINE EDISYLATE 10 MG/2ML IJ SOLN: 10 MG/2ML | INTRAMUSCULAR | Qty: 2 | Fill #0

## 2023-08-05 NOTE — Op Note (Signed)
 OPERATIVE REPORT    FACILITY: [facility]    PATIENT NAME: Katherine [name]     DATE OF OPERATION: 08/05/23    PREOPERATIVE DIAGNOSIS: Right hip end stage osteoarthritis    POSTOPERATIVE DIAGNOSIS: same    OPERATIVE PROCEDURE:   1. Right total hip arthroplasty

## 2023-08-05 NOTE — Progress Notes (Signed)
 Ortho / Neurosurgery Progress Note    POD# 0  s/p RIGHT TOTAL HIP REPLACEMENT, ANTERIOR APPROACH   Pt seen with family at bedside, pt is very sleepy and drowsy from anaesthesia. Reports hip pain despite falling asleep. Pt on 3L NC. Not voided yet.    Patie

## 2023-08-05 NOTE — Anesthesia Procedure Notes (Signed)
 Peripheral Block    Patient location during procedure: pre-op  Reason for block: post-op pain management and at surgeon's request  Start time: 08/05/2023 10:48 AM  End time: 08/05/2023 10:52 AM  Staffing  Performed: anesthesiologist   Anesthesiologist: Redmond Pulling

## 2023-08-05 NOTE — H&P (Signed)
 HISTORY OF PRESENT ILLNESS  Chief Complaint: Pain of the Right Hip and Pain of the Left Hip   Age: 67 y.o.    Sex: female   Hand-dominance: Right     History of present illness: Katherine [name] presents today for evaluation of right hip pain.The patient pre

## 2023-08-05 NOTE — Plan of Care (Signed)
 Problem: Chronic Conditions and Co-morbidities  Goal: Patient's chronic conditions and co-morbidity symptoms are monitored and maintained or improved  Outcome: Progressing     Problem: Pain  Goal: Verbalizes/displays adequate comfort level or baseline co

## 2023-08-05 NOTE — Anesthesia Post-Procedure Evaluation (Signed)
 Department of Anesthesiology  Postprocedure Note    Patient: Katherine [name]  MRN: [id number]  Birthdate: [birth year]  Date of evaluation: 08/05/2023    Procedure Summary       Date: 08/05/23 Room / Location: [facility] / [facility]    Anesthesia Start

## 2023-08-05 NOTE — Plan of Care (Signed)
 Problem: Physical Therapy - Adult  Goal: By Discharge: Performs mobility at highest level of function for planned discharge setting.  See evaluation for individualized goals.  Description: FUNCTIONAL STATUS PRIOR TO ADMISSION: Patient was modified indepe

## 2023-08-05 NOTE — Anesthesia Pre-Procedure Evaluation (Addendum)
 Department of Anesthesiology  Preprocedure Note       Name:  Katherine [name]   Age:  67 y.o.  DOB:  09-01-1955                                          MRN:  [id number]         Date:  08/05/2023      Surgeon: Moishe Spice):  Roxan Diesel, MD    Procedure: Demetrius Charity

## 2023-08-05 NOTE — Other (Signed)
 08/05/23 1310   Handoff   Communication Given Periop Handoff/Relief   Handoff phase Phase I relief   Handoff Given To Neldon Labella RN   Handoff Received From Lyn Henri RN   Handoff Communication Face to Face;At bedside   Time Handoff Given 1310

## 2023-08-05 NOTE — Care Coordination-Inpatient (Signed)
 CM completed chart review; pt presented for pre-planned surgery. [facility] home health had been referred during preop planning, CM opened referral in [facility], sending order and clinicals when available, AVS updated with contact information for [facility]. Per ch

## 2023-08-06 LAB — BASIC METABOLIC PANEL
Anion Gap: 5 mmol/L (ref 2–12)
BUN/Creatinine Ratio: 18 (ref 12–20)
BUN: 19 mg/dL (ref 6–20)
CO2: 30 mmol/L (ref 21–32)
Calcium: 8.2 mg/dL — ABNORMAL LOW (ref 8.5–10.1)
Chloride: 99 mmol/L (ref 97–108)
Creatinine: 1.05 mg/dL — ABNORMAL HIGH (ref 0.55–1.02)
Est, Glom Filt Rate: 58 mL/min/{1.73_m2} — ABNORMAL LOW (ref >60)
Glucose: 121 mg/dL — ABNORMAL HIGH (ref 65–100)
Potassium: 2.9 mmol/L — ABNORMAL LOW (ref 3.5–5.1)
Sodium: 134 mmol/L — ABNORMAL LOW (ref 136–145)

## 2023-08-06 LAB — HEMOGLOBIN AND HEMATOCRIT
Hematocrit: 34.2 % — ABNORMAL LOW (ref 35.0–47.0)
Hemoglobin: 11.2 g/dL — ABNORMAL LOW (ref 11.5–16.0)

## 2023-08-06 MED ORDER — POTASSIUM CHLORIDE CRYS ER 20 MEQ PO TBCR
20 | Freq: Once | ORAL | Status: AC
Start: 2023-08-06 — End: 2023-08-06
  Administered 2023-08-06: 14:00:00 40 meq via ORAL

## 2023-08-06 MED FILL — OXYCODONE HCL 5 MG PO TABS: 5 MG | ORAL | Qty: 1 | Fill #0

## 2023-08-06 MED FILL — FAMOTIDINE 20 MG PO TABS: 20 MG | ORAL | Qty: 1

## 2023-08-06 MED FILL — BUSPIRONE HCL 10 MG PO TABS: 10 MG | ORAL | Qty: 3

## 2023-08-06 MED FILL — ACETAMINOPHEN 500 MG PO TABS: 500 MG | ORAL | Qty: 2

## 2023-08-06 MED FILL — OXYCODONE HCL 5 MG PO TABS: 5 MG | ORAL | Qty: 2

## 2023-08-06 MED FILL — KETOROLAC TROMETHAMINE 30 MG/ML IJ SOLN: 30 MG/ML | INTRAMUSCULAR | Qty: 1

## 2023-08-06 MED FILL — BUSPIRONE HCL 10 MG PO TABS: 10 MG | ORAL | Qty: 3 | Fill #0

## 2023-08-06 MED FILL — FAMOTIDINE 20 MG PO TABS: 20 MG | ORAL | Qty: 1 | Fill #0

## 2023-08-06 MED FILL — ASPIRIN LOW DOSE 81 MG PO TBEC: 81 MG | ORAL | Qty: 1 | Fill #0

## 2023-08-06 MED FILL — DULOXETINE HCL 30 MG PO CPEP: 30 MG | ORAL | Qty: 2

## 2023-08-06 MED FILL — DULOXETINE HCL 30 MG PO CPEP: 30 MG | ORAL | Qty: 2 | Fill #0

## 2023-08-06 MED FILL — FUROSEMIDE 40 MG PO TABS: 40 MG | ORAL | Qty: 1

## 2023-08-06 MED FILL — STOOL SOFTENER/LAXATIVE 50-8.6 MG PO TABS: 50-8.6 MG | ORAL | Qty: 1 | Fill #0

## 2023-08-06 MED FILL — ASPIRIN LOW DOSE 81 MG PO TBEC: 81 MG | ORAL | Qty: 1

## 2023-08-06 MED FILL — STOOL SOFTENER/LAXATIVE 50-8.6 MG PO TABS: 50-8.6 MG | ORAL | Qty: 1

## 2023-08-06 MED FILL — TOPIRAMATE 100 MG PO TABS: 100 MG | ORAL | Qty: 2 | Fill #0

## 2023-08-06 MED FILL — GABAPENTIN 300 MG PO CAPS: 300 MG | ORAL | Qty: 4 | Fill #0

## 2023-08-06 MED FILL — KETOROLAC TROMETHAMINE 30 MG/ML IJ SOLN: 30 MG/ML | INTRAMUSCULAR | Qty: 1 | Fill #0

## 2023-08-06 MED FILL — CEFAZOLIN SODIUM 1 G IJ SOLR: 1 g | INTRAMUSCULAR | Qty: 2000

## 2023-08-06 MED FILL — PREDNISOLONE ACETATE 1 % OP SUSP: 1 % | OPHTHALMIC | Qty: 100

## 2023-08-06 MED FILL — HEALTHYLAX 17 G PO PACK: 17 g | ORAL | Qty: 1

## 2023-08-06 MED FILL — DEXAMETHASONE SODIUM PHOSPHATE 4 MG/ML IJ SOLN: 4 MG/ML | INTRAMUSCULAR | Qty: 3

## 2023-08-06 MED FILL — POTASSIUM CHLORIDE CRYS ER 20 MEQ PO TBCR: 20 MEQ | ORAL | Qty: 2

## 2023-08-06 MED FILL — TOPIRAMATE 100 MG PO TABS: 100 MG | ORAL | Qty: 2

## 2023-08-06 NOTE — Care Coordination-Inpatient (Signed)
Discussed in IDT rounds; recommendation by care team is intensive rehab stay prior to returning home. Met with pt and family member at bedside, pt gave permission to discuss discharge plan with family member present. CM informed pt and family of recommendation for IPR, pt verbalized understanding and agreement with this recommendation, no preference voiced. CM suggested sending multiple referrals for pt to choose between acceptances, pt voiced agreement with this approach. CM advised that depending on acceptances and bed availability, discharge may be as soon as this afternoon, pt and family verbalized understanding of potential timeline. IPR referrals sent.    Ubaldo Glassing, LMSW  Care Management  [phone]

## 2023-08-06 NOTE — Progress Notes (Signed)
2130 - Patient unable to void, has urge to void and bladder discomfort with 625 mL on bladder scan. Straight cathed             for 700 mL  0620 - Patient unable to void, no current urge or discomfort, bladder scan shows 190 mL.  Patient encouraged to              drink fluids and will continue to monitor.

## 2023-08-06 NOTE — Plan of Care (Addendum)
Problem: Occupational Therapy - Adult  Goal: By Discharge: Performs self-care activities at highest level of function for planned discharge setting.  See evaluation for individualized goals.  Description: FUNCTIONAL STATUS PRIOR TO ADMISSION:    Receives Help From:  (sister if needed.  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      HOME SUPPORT: Patient lived alone with 51 year old grandson to provide assistance.    Occupational Therapy Goals:  Initiated 08/06/2023  1.  Patient will perform grooming in standing  with Minimal Assist within 7 day(s).  2.  Patient will perform seated sponge bathing with Set-up and Supervision within 7 day(s).  3.  Patient will perform lower body dressing with Minimal Assist, using AE prn,  within 7 day(s).  4.  Patient will perform toilet transfers with Minimal Assist  within 7 day(s).  5.  Patient will perform all aspects of toileting with Minimal Assist within 7 day(s).  6.  Patient will participate in upper extremity therapeutic exercise/activities with Supervision for 5 minutes within 7 day(s).    7.  Patient will tolerate standing adls for at least 5 minutes within 7 days.  Outcome: Not Progressing    OCCUPATIONAL THERAPY EVALUATION    Patient: Katherine [name] (67 y.o. female)  Date: 08/06/2023  Primary Diagnosis: Primary osteoarthritis of right hip [M16.11]  Procedure(s) (LRB):  RIGHT TOTAL HIP REPLACEMENT, ANTERIOR APPROACH (Right) 1 Day Post-Op     Precautions: ROM Restrictions, Surgical protocol (Anterior Hip Precautions)             Hip Precautions: Anterior hip precautions    ASSESSMENT :  The patient is limited by decreased functional mobility, independence in ADLs, high-level IADLs, ROM, strength, activity tolerance, endurance, coordination,  balance, increased pain levels.  Pt recently received pain medication, but continued to c/o high level (7/10) of pain during mobility efforts.   Pt needs up to maximal assistance for adls, and consistent verbal cues and min A for side stepping along the EOB.       Based on the impairments listed above pt will benefit from acute OT services.    Functional Outcome Measure:  The patient scored 35/100 on the Barthel Index outcome measure which is indicative of dependent adl performance.         PLAN :  Recommendations and Planned Interventions:   self care training, therapeutic activities, functional mobility training, balance training, therapeutic exercise, endurance activities, patient education, and home safety training    Frequency/Duration: OT Plan of Care: 5 times/week    Recommendation for discharge: (in order for the patient to meet his/her long term goals):   High intensity/comprehensive skilled occupational therapy in a multidisciplinary setting as patient is working towards tolerating up to 3 hours of therapy/day 5-7x/week    Other factors to consider for discharge: lives alone, available support system works or is unable to provide adequate supervision and the patient would be alone, high risk for falls, not safe to be alone, and concern for safely navigating or managing the home environment    IF patient discharges home will need the following DME: shower chair       SUBJECTIVE:   Patient stated, "I didn't think it would be  so painful."  OBJECTIVE DATA SUMMARY:     Past Medical History:   Diagnosis Date    Adverse effect of anesthesia     hard time breathing after surgery, hard time waking up    Arthritis     Chronic obstructive pulmonary disease (HCC)     mild per pt    Chronic UTI     Depression     Diverticulosis     Dizziness     Easy bruising     Fibromyalgia     chronic pain    Gentamicin ototoxicity, right     no balance on this side of her body    GERD (gastroesophageal reflux disease)      Headache(784.0)     Hearing loss     bilateral    IBS (irritable bowel syndrome)     Ill-defined condition     past hx of shingles--eye/ear 13 years ago    Lupus     Melanoma (HCC)     on back    Meniere disease     Migraines     Numbness     hand and feet    Sleep apnea     did not tolerate cpap    Sweats, menopausal     Visual loss     right     Past Surgical History:   Procedure Laterality Date    APPENDECTOMY      BREAST BIOPSY Right     benign core bx    CARPAL TUNNEL RELEASE Right 01/2014    with trigger finger    CHOLECYSTECTOMY      COLONOSCOPY      COLONOSCOPY N/A 09/15/2018    COLONOSCOPY performed by Blake Divine, MD at [facility]    CORNEAL TRANSPLANT Right     HEENT      two ear surgeries to try to fix meniere's disease, second one they used gentimycin, ruined her balance always dizzy    HERNIA REPAIR      left, incisional after first bladder repair    HYSTERECTOMY (CERVIX STATUS UNKNOWN) Bilateral     complete    ORTHOPEDIC SURGERY  1982, 1991    back x2, no hardware    OTHER SURGICAL HISTORY  11/18/2021    EXCISION 3CM RIGHT CHEST WALL MASS    OVARY REMOVAL      SKIN LESION EXCISION Right 11/20/2022    EXCISION 2CM RIGHT CHEST WALL MASS performed by Sabino Snipes, MD at [facility]    TONSILLECTOMY      TOTAL COLECTOMY  1994    1 foot of colon removed    TOTAL KNEE ARTHROPLASTY Right 2019    UPPER GASTROINTESTINAL ENDOSCOPY      with dilatation    UROLOGICAL SURGERY      bladder surgery x2          Expanded or extensive additional review of patient history:   Pt has complex medical history  Social/Functional History  Lives With: Alone (7 year old grandson staying with her who works; sister is able to assist prn, but not full time)  Type of Home: House  Home Layout: One level  Home Access: Stairs to enter with rails  Entrance Stairs - Number of Steps: 4  Entrance Stairs - Rails: Both  Bathroom Shower/Tub: Pension scheme manager: Handicap height  Bathroom Equipment: Grab bars  in shower  Home Equipment: Roll About, Medical laboratory scientific officer, Environmental consultant - Rolling  Has the  patient had two or more falls in the past year or any fall with injury in the past year?: No  Receives Help From:  (sister if needed.  grandson lives with her)  Prior Level of Assist for ADLs: Independent  Prior Level of Assist for Homemaking: Needs assistance  Prior Level of Assist for Ambulation: Independent household ambulator, with or without device, Teacher, adult education, with or without device  Prior Level of Assist for Transfers: Independent      Hand Dominance: right     EXAMINATION OF PERFORMANCE DEFICITS:    Cognitive/Behavioral Status:  Orientation  Overall Orientation Status: Within Functional Limits  Orientation Level: Oriented X4  Cognition  Overall Cognitive Status: WFL    Skin: appears intact    Edema: none observed    Hearing:   Hearing  Hearing: Within functional limits    Vision/Perceptual:    Vision - Basic Assessment  Patient Visual Report: No visual complaint reported.      Per chart pt has vision issues, but not reported this date  Perception  Overall Perceptual Status: WFL    Range of Motion:   AROM: Generally decreased, functional  PROM: Generally decreased, functional      Strength:  Strength: Generally decreased, functional      Coordination:  Coordination: Generally decreased, functional (BUEs)            Tone & Sensation:   Tone: Normal  Sensation: Intact          Functional Mobility and Transfers for ADLs:    Bed Mobility:     Bed Mobility Training  Bed Mobility Training: Yes  Interventions: Safety awareness training;Verbal cues  Supine to Sit: Minimum assistance;Additional time  Sit to Supine: Minimum assistance;Additional time  Scooting: Minimum assistance  C/o pain  Transfers:      Art therapist: Yes  Interventions: Safety awareness training;Verbal cues  Sit to Stand: Minimum assistance  Stand to Sit: Minimum assistance  Stand Pivot Transfers:  (declined)  Bed to Chair:   (declined)  Toilet Transfer:  (declined)          Functional Mobility: Minimal assistance;Moderate assistance;Increased time to complete          Balance:      Balance  Sitting: Impaired  Sitting - Static: Fair (occasional);Good (unsupported)  Sitting - Dynamic: Fair (occasional) (able to reach to knees)  Standing: Impaired (reports pain)  Standing - Static: Constant support;Fair  Standing - Dynamic: Fair;Constant support      ADL Assessment:          Feeding: Setup (at  bed level)       Grooming: Setup;Supervision  Grooming Skilled Clinical Factors: seated    UE Bathing: Minimal assistance  UE Bathing Skilled Clinical Factors: bed level         LE Bathing: Maximum assistance  LE Bathing Skilled Clinical Factors: bed level    UE Dressing: Minimal assistance       LE Dressing: Dependent/Total  LE Dressing Skilled Clinical Factors: bed level//seated EOB/ assist for standing            Additional Comments: pt reports increased pain once mobilizing    Functional Mobility: Minimal assistance;Moderate assistance;Increased time to complete  Functional Mobility Skilled Clinical Factors: sidestepped to [facility] approx 2 feet, with constant verbal cues and RW  Additional Comments: pt reports increased pain once mobilizing      ADL Intervention and task modifications:  Educated pt on role of OT and OT plan of care.  Very verbose re: what she cannot do.  Educated on benefit of using AE for adls, but unable to initiate this session due to pt reporting that she'd been up in the chair a long time this a.m. and wished to rest and visit with her family.  She reported that her pain was greater than she remembered with her knee surgery.  Pt had recently received pain medication prior to tx session, as well as had been bladder scanned and straight cath.  Pt has hx of bladder surgery.  .                                                                                                                                                                                                                                            Barthel Index:    Barthel Index Scale  Feeding: Independent, Able to apply any necessary device. Feeds in reasonable time  Bathing: Cannot perform activity  Grooming: Washes face, combs hair, brushes teeth, shaves (manages plug if Neurosurgeon)  Dressing: Cannot perform activity  Bowel Control: No accidents. Able to use enema or suppository if needed  Bladder Control: Cannot perform activity  Toilet Transfers: Needs help for balance, handling clothes or toilet paper  Chair/Bed Trannsfers: Able to sit, but needs maximum assistance to transfer  Ambulation: Cannot perform activity  Stairs: Cannot perform activity  Total Barthel Index Score: 35       The Barthel ADL Index: Guidelines  1. The index should be used as a record of what a patient does, not as a record of what a patient could do.  2. The main aim is to establish degree of independence from any help, physical or verbal, however minor and for whatever reason.  3. The need for supervision renders the patient not independent.  4. A patient's performance should be established using the best available evidence. Asking the patient, friends/relatives and nurses are the usual sources, but direct observation and common sense are also important. However direct testing is not needed.  5. Usually the patient's performance over the preceding 24-48 hours is important, but occasionally longer periods will be relevant.  6. Middle categories imply that the patient supplies over 50 per cent of the effort.  7. Use of aids to be independent is allowed.  Score Interpretation (from Sinoff 1997)   80-100 Independent   60-79 Minimally independent   40-59 Partially dependent   20-39 Very dependent   <20 Totally dependent     -Mahoney, F.l., Barthel, D.W. (1965). Functional evaluation: the Barthel Index. [place] [address] [place] (14)2.  -Sinoff, G., Ore, L. (1997). The Barthel activities of daily living index:  self-reporting versus actual performance in the old (> or = 75 years). Journal of American Geriatric Society 45(7), [phone].   -Kenn File [place], J.J.M.F, Noel Christmas., Imagene Gurney. (1999). Measuring the change in disability after inpatient rehabilitation; comparison of the responsiveness of the Barthel Index and Functional Independence Measure. Journal of Neurology, Neurosurgery, and Psychiatry, 66(4), [phone].  Dawson Bills, N.J.A, [place],  W.J.M, & Koopmanschap, M.A. (2004) Assessment of post-stroke quality of life in cost-effectiveness studies: The usefulness of the Barthel Index and the EuroQoL-5D. Quality of Life Research, 13, 427-43                                                                                                                                                                                                                                 Pain Rating:  Pt initially did not rate pain, once seated EOB pt c/o 7/10   Pain Intervention(s):   patient medicated for pain prior to session, ice, and repositioning  Pt did not wish to lie flat  Activity Tolerance:   Fair , Poor, requires rest breaks, SpO2 stable on room air, and VS stable    After treatment:   Patient left in no apparent distress in bed, Call bell within reach, Caregiver / family present, Side rails x3    COMMUNICATION/EDUCATION:   The patient's plan of care was discussed with: physical therapy assistant and registered nurse    Patient Education  Education Given To: Patient  Education Provided: ADL Adaptive Strategies;Transfer Training;Mobility Training;Role of Therapy;Plan of Care  Education Method: Verbal  Barriers to Learning: None  Education Outcome: Continued education needed    Thank you for this referral.  Lambert Keto, OTR/L  Minutes: 26    Occupational Therapy Evaluation Charge Determination   History Examination Decision-Making   MEDIUM Complexity : Expanded review of history including physical, cognitive  and psychocial history  MEDIUM Complexity: 3-5 Performance deficits relating to physical, cognitive, or psychosocial skills that result in activity limitations and/or participation restrictions MEDIUM Complexity: Patient may present with comorbidities that affect occupational  performance. Minimal to moderate modifications of tasks or assist (eg. physical or verbal) with assist is necessary to enable pt to complete eval   Based on the above components, the patient evaluation is determined to be of the following complexity level: Medium

## 2023-08-06 NOTE — Plan of Care (Signed)
Problem: Occupational Therapy - Adult  Goal: By Discharge: Performs self-care activities at highest level of function for planned discharge setting.  See evaluation for individualized goals.  Description: FUNCTIONAL STATUS PRIOR TO ADMISSION:    Receives Help From:  (sister if needed.  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      HOME SUPPORT: Patient lived alone with 24 year old grandson to provide assistance.    Occupational Therapy Goals:  Initiated 08/06/2023  1.  Patient will perform grooming in standing  with Minimal Assist within 7 day(s).  2.  Patient will perform seated sponge bathing with Set-up and Supervision within 7 day(s).  3.  Patient will perform lower body dressing with Minimal Assist, using AE prn,  within 7 day(s).  4.  Patient will perform toilet transfers with Minimal Assist  within 7 day(s).  5.  Patient will perform all aspects of toileting with Minimal Assist within 7 day(s).  6.  Patient will participate in upper extremity therapeutic exercise/activities with Supervision for 5 minutes within 7 day(s).    7.  Patient will tolerate standing adls for at least 5 minutes within 7 days.  08/06/2023 1229 by Lambert Keto, OTR/L  Outcome: Not Progressing  08/06/2023 1226 by Lambert Keto, OTR/L  Outcome: Not Progressing     Problem: Occupational Therapy - Adult  Goal: By Discharge: Performs self-care activities at highest level of function for planned discharge setting.  See evaluation for individualized goals.  Description: FUNCTIONAL STATUS PRIOR TO ADMISSION:    Receives Help From:  (sister if needed.  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      HOME SUPPORT: Patient lived alone with 6 year old grandson to provide assistance.    Occupational Therapy Goals:  Initiated 08/06/2023  1.  Patient will perform grooming in standing  with Minimal Assist within 7 day(s).  2.  Patient will perform seated sponge bathing with Set-up and Supervision within 7 day(s).  3.  Patient will perform lower body dressing with Minimal Assist, using AE prn,  within 7 day(s).  4.  Patient will perform toilet transfers with Minimal Assist  within 7 day(s).  5.  Patient will perform all aspects of toileting with Minimal Assist within 7 day(s).  6.  Patient will participate in upper extremity therapeutic exercise/activities with Supervision for 5 minutes within 7 day(s).    7.  Patient will tolerate standing adls for at least 5 minutes within 7 days.  08/06/2023 1229 by Lambert Keto, OTR/L  Outcome: Not Progressing  08/06/2023 1226 by Lambert Keto, OTR/L  Outcome: Not Progressing

## 2023-08-06 NOTE — Progress Notes (Signed)
Attempted to deliver and verbally explain the MOON/VOON with patient. Patient was with clinical staff. Larena Sox, Care Management Assistant

## 2023-08-06 NOTE — Progress Notes (Signed)
Patient has RW for home use. Daughter at bedside.    Discussed the importance of using pain medication and other methods for pain control such as ice/rest/elevate, pre-medicating prior to physical therapy.  Discussed the importance of being safe at hospital and home by using RW until cleared by PT, wearing safe shoes, preparing home for after surgery and having a coach to help at home.  Discussed the importance of home PT, daily exercises as instructed by physical therapist and post-op appointment with surgeon and the need for transportation to this appointment until the surgeon has cleared you for driving.    Discussed risk after surgery and the importance of preventing infection by good hand hygiene, using clean towels and wash cloths at each shower, inspect wound/dressing daily, moving every two hours while awake, using the incentive spirometer every hour while awake.  When to call the doctor for help, or when to call 911 for emergency.    Opportunity given for patient/family to ask additional questions about any special concerns regarding your care while on the Ortho unit and preparing for discharge.

## 2023-08-06 NOTE — Plan of Care (Signed)
Problem: Chronic Conditions and Co-morbidities  Goal: Patient's chronic conditions and co-morbidity symptoms are monitored and maintained or improved  08/06/2023 0957 by Bebe Liter, RN  Outcome: Progressing  08/05/2023 2302 by Lavenia Atlas, RN  Outcome: Progressing     Problem: Pain  Goal: Verbalizes/displays adequate comfort level or baseline comfort level  08/06/2023 0957 by Bebe Liter, RN  Outcome: Progressing  08/05/2023 2302 by Lavenia Atlas, RN  Outcome: Progressing     Problem: Safety - Adult  Goal: Free from fall injury  08/06/2023 0957 by Bebe Liter, RN  Outcome: Progressing  08/05/2023 2302 by Lavenia Atlas, RN  Outcome: Progressing     Problem: ABCDS Injury Assessment  Goal: Absence of physical injury  08/06/2023 0957 by Bebe Liter, RN  Outcome: Progressing  08/05/2023 2302 by Lavenia Atlas, RN  Outcome: Progressing

## 2023-08-06 NOTE — Plan of Care (Signed)
Problem: Physical Therapy - Adult  Goal: By Discharge: Performs mobility at highest level of function for planned discharge setting.  See evaluation for individualized goals.  Description: FUNCTIONAL STATUS PRIOR TO ADMISSION: Patient was modified independent using a single point cane for functional mobility.    HOME SUPPORT PRIOR TO ADMISSION: The patient lived alone with daughter to provide assistance.    Physical Therapy Goals  Initiated 08/05/2023  1.  Patient will move from supine to sit and sit to supine in bed with contact guard assist within 4 day(s).    2.  Patient will perform sit to stand with contact guard assist within 4 day(s).  3.  Patient will transfer from bed to chair and chair to bed with contact guard assist using the least restrictive device within 4 day(s).  4.  Patient will ambulate with contact guard assist for 100 feet with the least restrictive device within 4 day(s).   5.  Patient will ascend/descend 4 stairs with bilateral handrail(s) with modified independence within 4 day(s).  6.  Patient will perform home exercise program per protocol with modified independence within 4 days.        Outcome: Progressing   PHYSICAL THERAPY TREATMENT    Patient: Katherine [name] (67 y.o. female)  Date: 08/06/2023  Diagnosis: Primary osteoarthritis of right hip [M16.11] Primary osteoarthritis of right hip  Procedure(s) (LRB):  RIGHT TOTAL HIP REPLACEMENT, ANTERIOR APPROACH (Right) 1 Day Post-Op  Precautions:               Hip Precautions: Anterior hip precautions        ASSESSMENT:  Patient continues to benefit from skilled PT services and is progressing towards goals. Pt received semi fowlers in bed, agreeable to participate in therapy. Pt more alert this session however processing continues to be slow, requiring increased time to complete tasks. BP stable in all positions however noted to be elevated 2/2 increased pain. Pt continues to require significant assist for mobility tasks with cueing needed  for proper performance and sequencing, fair carryover noted. Progressed with short gait trial to bedside chair with significant gait mechanic impairments. Pt making slow progress towards goals, limited 2/2 increased pain levels, decreased ROM, generalized weakness, difficulty with ambulation, and significant high fall risk. Pt continues to perform below baseline and would benefit from skilled PT services in order to promote full return to PLOF.    Patient is cleared for discharge from PT standpoint:  YES []      NO [x]         PLAN:  Patient continues to benefit from skilled intervention to address the above impairments.  Continue treatment per established plan of care.    Recommendations for staff mobility and toileting assistance:  Recommend that staff completes patient mobility with assist x2 using gait belt and rolling walker. and Recommend toileting using  recommended toilet device: a bedside commode.      Recommendation for discharge: (in order for the patient to meet his/her long term goals):   High intensity/comprehensive skilled physical therapy in a multidisciplinary setting as patient is working towards tolerating up to 3 hours of therapy/day 5-7x/week    Other factors to consider for discharge: patient's current support system is unable to meet their requirements for physical assistance, high risk for falls, not safe to be alone, and concern for safely navigating or managing the home environment    IF patient discharges home will need the following DME: patient owns DME required for discharge  SUBJECTIVE:   Patient stated, "I do not feel like I can go home."    OBJECTIVE DATA SUMMARY:   Critical Behavior:  Orientation  Overall Orientation Status: Within Normal Limits  Orientation Level: Oriented X4  Cognition  Overall Cognitive Status: WNL    Functional Mobility Training:  Bed Mobility:  Bed Mobility Training  Bed Mobility Training: Yes  Interventions: Safety awareness training;Verbal cues  Supine to  Sit: Minimum assistance;Additional time  Scooting: Minimum assistance  Transfers:  Transfer Training  Transfer Training: Yes  Interventions: Safety awareness training;Verbal cues  Sit to Stand: Moderate assistance  Stand to Sit: Moderate assistance  Bed to Chair: Moderate assistance;Additional time (RW)  Balance:  Balance  Sitting: Impaired  Sitting - Static: Fair (occasional)  Sitting - Dynamic: Fair (occasional)  Standing: Impaired  Standing - Static: Constant support;Fair  Standing - Dynamic: Fair;Constant support   Ambulation/Gait Training:     Soil scientist: Yes  Overall Level of Assistance: Moderate assistance;Additional time  Distance (ft): 3 Feet  Assistive Device: Gait belt;Walker, rolling  Interventions: Safety awareness training;Verbal cues;Manual cues (manual cues for moving RW)  Base of Support: Narrowed  Speed/Cadence: Pace decreased (< 100 feet/min);Slow  Step Length: Left shortened;Right shortened  Stance: Right decreased  Gait Abnormalities: Antalgic;Decreased step clearance;Step to gait        Neuro Re-Education:                    Pain Rating:  10/10   Pain Intervention(s):       Activity Tolerance:   Fair , limited by pain    After treatment:   Patient left in no apparent distress sitting up in chair, Call bell within reach, Bed/ chair alarm activated, Caregiver / family present, and Updated patient's board on functional status and mobility recommendations      COMMUNICATION/EDUCATION:   The patient's plan of care was discussed with: occupational therapist, registered nurse, case manager, and NP    Patient Education  Education Given To: Patient;Family  Education Provided: Role of Therapy;Plan of Care;Home Exercise Architectural technologist;Fall Prevention Strategies  Education Method: Verbal  Barriers to Learning: None  Education Outcome: Verbalized understanding;Continued education needed      Salomon Fick, PTA  Minutes: 33

## 2023-08-06 NOTE — Plan of Care (Signed)
Problem: Physical Therapy - Adult  Goal: By Discharge: Performs mobility at highest level of function for planned discharge setting.  See evaluation for individualized goals.  Description: FUNCTIONAL STATUS PRIOR TO ADMISSION: Patient was modified independent using a single point cane for functional mobility.    HOME SUPPORT PRIOR TO ADMISSION: The patient lived alone with daughter to provide assistance.    Physical Therapy Goals  Initiated 08/05/2023  1.  Patient will move from supine to sit and sit to supine in bed with contact guard assist within 4 day(s).    2.  Patient will perform sit to stand with contact guard assist within 4 day(s).  3.  Patient will transfer from bed to chair and chair to bed with contact guard assist using the least restrictive device within 4 day(s).  4.  Patient will ambulate with contact guard assist for 100 feet with the least restrictive device within 4 day(s).   5.  Patient will ascend/descend 4 stairs with bilateral handrail(s) with modified independence within 4 day(s).  6.  Patient will perform home exercise program per protocol with modified independence within 4 days.        08/06/2023 1521 by Salomon Fick, PTA  Outcome: Progressing  08/06/2023 1120 by Salomon Fick, PTA  Outcome: Progressing   PHYSICAL THERAPY TREATMENT    Patient: Katherine [name] (67 y.o. female)  Date: 08/06/2023  Diagnosis: Primary osteoarthritis of right hip [M16.11]  Osteoarthritis of right hip, unspecified osteoarthritis type [M16.11] Primary osteoarthritis of right hip  Procedure(s) (LRB):  RIGHT TOTAL HIP REPLACEMENT, ANTERIOR APPROACH (Right) 1 Day Post-Op  Precautions: ROM Restrictions, Surgical protocol (Anterior Hip Precautions)             Hip Precautions: Anterior hip precautions        ASSESSMENT:  Patient continues to benefit from skilled PT services and is slowly progressing towards goals. Pt received semi fowlers in bed, agreeable and eager to participate in therapy. Pt continues to  demonstrate improvements with mobility however is limited 2/2 generalized weakness, decreased ROM, poor balance, impaired gait mechanics, and overall deconditioning. Continues to require additional time to complete bed mobility however noted to be more efficient than earlier session. Progressed with short bout of ambulation in room, significantly limited 2/2 increased pain levels. Pt continues to perform below baseline and would benefit from skilled PT services in order to promote full return to PLOF.       PLAN:  Patient continues to benefit from skilled intervention to address the above impairments.  Continue treatment per established plan of care.    Recommendations for staff mobility and toileting assistance:  Recommend that staff completes patient mobility with assist x1 using gait belt and rolling walker. and Recommend toileting using  recommended toilet device: a bedside commode.      Recommendation for discharge: (in order for the patient to meet his/her long term goals):   High intensity/comprehensive skilled physical therapy in a multidisciplinary setting as patient is working towards tolerating up to 3 hours of therapy/day 5-7x/week    Other factors to consider for discharge: high risk for falls, not safe to be alone, and concern for safely navigating or managing the home environment    IF patient discharges home will need the following DME: patient owns DME required for discharge       SUBJECTIVE:   Patient stated, "I just hurt all the time."    OBJECTIVE DATA SUMMARY:   Critical Behavior:  Orientation  Overall Orientation Status:  Within Functional Limits  Orientation Level: Oriented X4  Cognition  Overall Cognitive Status: WFL  Cognition Comment: pt very tangential throughout session    Functional Mobility Training:  Bed Mobility:  Bed Mobility Training  Bed Mobility Training: Yes  Interventions: Safety awareness training;Verbal cues  Supine to Sit: Minimum assistance;Additional time  Sit to Supine:  Minimum assistance;Additional time  Scooting: Contact-guard assistance;Additional time  Transfers:  Art therapist: Yes  Interventions: Safety awareness training;Verbal cues  Sit to Stand: Minimum assistance (multiple attempts needed)  Stand to Sit: Minimum assistance  Stand Pivot Transfers:  (declined)  Bed to Chair: Minimum assistance (RW)  Toilet Transfer:  (declined)  Balance:  Balance  Sitting: Impaired  Sitting - Static: Fair (occasional);Good (unsupported)  Sitting - Dynamic: Fair (occasional)  Standing: Impaired  Standing - Static: Constant support;Fair  Standing - Dynamic: Fair;Constant support   Ambulation/Gait Training:     Gait  Gait Training: Yes  Right Side Weight Bearing: As tolerated  Overall Level of Assistance: Moderate assistance;Additional time  Distance (ft): 8 Feet  Assistive Device: Gait belt;Walker, rolling  Interventions: Safety awareness training;Verbal cues  Base of Support: Narrowed  Speed/Cadence: Pace decreased (< 100 feet/min);Shuffled  Step Length: Left shortened;Right shortened  Stance: Right decreased  Gait Abnormalities: Antalgic;Decreased step clearance;Step to gait        Neuro Re-Education:                    Pain Rating:  Reported pain, unquantified  Pain Intervention(s):       Activity Tolerance:   Fair     After treatment:   Patient left in no apparent distress sitting up in chair, Call bell within reach, Bed/ chair alarm activated, and Updated patient's board on functional status and mobility recommendations      COMMUNICATION/EDUCATION:   The patient's plan of care was discussed with: registered nurse    Patient Education  Education Given To: Patient  Education Provided: Role of Therapy;Plan of Art therapist;Fall Prevention Strategies  Education Method: Verbal  Barriers to Learning: None  Education Outcome: Verbalized understanding;Continued education needed      Salomon Fick, PTA  Minutes: 23

## 2023-08-06 NOTE — Progress Notes (Signed)
Ortho / Neurosurgery Progress Note    POD# 1  s/p RIGHT TOTAL HIP REPLACEMENT, ANTERIOR APPROACH   Pt seen with family at bedside, reports her hip pain is better but she has not been able to get out of bed and walk. She was straight cath last night for rentention. She is tolerating diet.    Pt stated she wants to go to rehab.    Patient in bed    VSS Afebrile.    Visit Vitals  BP 117/62   Pulse 91   Temp 98.1 F (36.7 C)   Resp 18   SpO2 97%       Voiding status: pending. Straight cath x1. Hx of bladder surgery in 2018           Labs    Lab Results   Component Value Date/Time    HGB 11.2 08/06/2023 04:21 AM      Lab Results   Component Value Date/Time    INR 0.9 07/28/2023 08:17 AM      Lab Results   Component Value Date/Time    NA 134 08/06/2023 04:21 AM    K 2.9 08/06/2023 04:21 AM    CL 99 08/06/2023 04:21 AM    CO2 30 08/06/2023 04:21 AM    BUN 19 08/06/2023 04:21 AM     Recent Glucose Results:   Glucose   Date Value Ref Range Status   08/06/2023 121 (H) 65 - 100 mg/dL Final   66/44/0347 425 (H) 65 - 100 mg/dL Final   95/63/8756 99 65 - 100 mg/dL Final           There is no height or weight on file to calculate BMI. : A BMI > 30 is classified as obesity and > 40 is classified as morbid obesity.     Awake and alert. No acute distress.    Dressing: Silver Dressing C.D.I.   No significant erythema or swelling  Cryotherapy in place over incision.   BLE sensation to light touch intact  BLE motor intact. Strength 5/5    SCD for mechanical DVT proph while in bed        PLAN:  1) PT BID - WBAT.   2) DVT Prophylaxis: Aspirin 81 mg BID   3) GI Prophylaxis - PECID  4) Pain control - scheduled tylenol  and toradol, and prn  oxycodone    5) Readiness for discharge:     [x]  Vital Signs stable    [x]  Labs stable    [x]  + Voiding    [x]  Wound intact, drainage minimal    [x]  Tolerating PO intake     []  Cleared by PT (OT if applicable) for discharge   [x]  Adequate pain control on oral medication alone        Discharge Plan:  Pending progress with PT    Discharge Needs: RW          Allayne Butcher, [facility]    Orthopedic Physician Assistant

## 2023-08-07 LAB — BASIC METABOLIC PANEL
Anion Gap: 4 mmol/L (ref 2–12)
BUN/Creatinine Ratio: 20 (ref 12–20)
BUN: 16 mg/dL (ref 6–20)
CO2: 31 mmol/L (ref 21–32)
Calcium: 8.7 mg/dL (ref 8.5–10.1)
Chloride: 100 mmol/L (ref 97–108)
Creatinine: 0.8 mg/dL (ref 0.55–1.02)
Est, Glom Filt Rate: 80 mL/min/{1.73_m2} (ref >60)
Glucose: 104 mg/dL — ABNORMAL HIGH (ref 65–100)
Potassium: 3.1 mmol/L — ABNORMAL LOW (ref 3.5–5.1)
Sodium: 135 mmol/L — ABNORMAL LOW (ref 136–145)

## 2023-08-07 LAB — HEMOGLOBIN AND HEMATOCRIT
Hematocrit: 34.5 % — ABNORMAL LOW (ref 35.0–47.0)
Hemoglobin: 11.7 g/dL (ref 11.5–16.0)

## 2023-08-07 MED ORDER — POTASSIUM BICARB-CITRIC ACID 20 MEQ PO TBEF
20 | Freq: Once | ORAL | Status: AC
Start: 2023-08-07 — End: 2023-08-07
  Administered 2023-08-07: 14:00:00 20 meq via ORAL

## 2023-08-07 MED FILL — HEALTHYLAX 17 G PO PACK: 17 g | ORAL | Qty: 1

## 2023-08-07 MED FILL — ASPIRIN LOW DOSE 81 MG PO TBEC: 81 MG | ORAL | Qty: 1

## 2023-08-07 MED FILL — EFFER-K 20 MEQ PO TBEF: 20 MEQ | ORAL | Qty: 1

## 2023-08-07 MED FILL — STOOL SOFTENER/LAXATIVE 50-8.6 MG PO TABS: 50-8.6 MG | ORAL | Qty: 1

## 2023-08-07 MED FILL — BUSPIRONE HCL 10 MG PO TABS: 10 MG | ORAL | Qty: 3

## 2023-08-07 MED FILL — TOPIRAMATE 100 MG PO TABS: 100 MG | ORAL | Qty: 2

## 2023-08-07 MED FILL — OXYCODONE HCL 5 MG PO TABS: 5 MG | ORAL | Qty: 2

## 2023-08-07 MED FILL — FAMOTIDINE 20 MG PO TABS: 20 MG | ORAL | Qty: 1

## 2023-08-07 MED FILL — GABAPENTIN 300 MG PO CAPS: 300 MG | ORAL | Qty: 4

## 2023-08-07 MED FILL — ACETAMINOPHEN 500 MG PO TABS: 500 MG | ORAL | Qty: 2

## 2023-08-07 MED FILL — DULOXETINE HCL 30 MG PO CPEP: 30 MG | ORAL | Qty: 2

## 2023-08-07 MED FILL — ONDANSETRON 4 MG PO TBDP: 4 MG | ORAL | Qty: 1

## 2023-08-07 NOTE — Plan of Care (Signed)
 Problem: Chronic Conditions and Co-morbidities  Goal: Patient's chronic conditions and co-morbidity symptoms are monitored and maintained or improved  08/07/2023 0957 by Domenic Polite, LPN  Outcome: Progressing  Flowsheets (Taken 08/07/2023 0735)  Care

## 2023-08-07 NOTE — Progress Notes (Signed)
 End of Shift Note    Bedside shift change report given to [facility] LPN (oncoming nurse) by Remo Lipps, RN (offgoing nurse).  Report included the following information SBAR, Kardex, and MAR    Shift worked:  night     Shift summary and any significant c

## 2023-08-07 NOTE — Plan of Care (Signed)
 Problem: Physical Therapy - Adult  Goal: By Discharge: Performs mobility at highest level of function for planned discharge setting.  See evaluation for individualized goals.  Description: FUNCTIONAL STATUS PRIOR TO ADMISSION: Patient was modified indepe

## 2023-08-07 NOTE — Care Coordination-Inpatient (Signed)
 Transition of Care Plan:    RUR: 8% low risk  Prior Level of Functioning: independent  Disposition: SNF  EDD: 08/09/23  If SNF or IPR: Date FOC offered: 08/06/23 (IPR), 08/07/23 (SNF)  Date FOC received: pending acceptances  Accepting facility: pending  Da

## 2023-08-07 NOTE — Plan of Care (Signed)
 Problem: Occupational Therapy - Adult  Goal: By Discharge: Performs self-care activities at highest level of function for planned discharge setting.  See evaluation for individualized goals.  Description: FUNCTIONAL STATUS PRIOR TO ADMISSION:    Katherine [name]

## 2023-08-07 NOTE — Progress Notes (Signed)
 Ortho / Neurosurgery Progress Note    POD# 1  s/p RIGHT TOTAL HIP REPLACEMENT, ANTERIOR APPROACH   Pt seen with no visitor at bedside, she reports pain is better today, she is voiding and tolerating diet. PT- recommending IPR.   Patient in bed    VSS Afebr

## 2023-08-08 MED FILL — ASPIRIN LOW DOSE 81 MG PO TBEC: 81 MG | ORAL | Qty: 1

## 2023-08-08 MED FILL — ACETAMINOPHEN 500 MG PO TABS: 500 MG | ORAL | Qty: 2

## 2023-08-08 MED FILL — BUSPIRONE HCL 10 MG PO TABS: 10 MG | ORAL | Qty: 3

## 2023-08-08 MED FILL — FAMOTIDINE 20 MG PO TABS: 20 MG | ORAL | Qty: 1

## 2023-08-08 MED FILL — STOOL SOFTENER/LAXATIVE 50-8.6 MG PO TABS: 50-8.6 MG | ORAL | Qty: 1

## 2023-08-08 MED FILL — ONDANSETRON 4 MG PO TBDP: 4 MG | ORAL | Qty: 1

## 2023-08-08 MED FILL — GABAPENTIN 300 MG PO CAPS: 300 MG | ORAL | Qty: 4

## 2023-08-08 MED FILL — HYDROMORPHONE HCL 1 MG/ML IJ SOLN: 1 MG/ML | INTRAMUSCULAR | Qty: 1

## 2023-08-08 MED FILL — HEALTHYLAX 17 G PO PACK: 17 g | ORAL | Qty: 1

## 2023-08-08 MED FILL — TOPIRAMATE 100 MG PO TABS: 100 MG | ORAL | Qty: 2

## 2023-08-08 MED FILL — FUROSEMIDE 40 MG PO TABS: 40 MG | ORAL | Qty: 1

## 2023-08-08 MED FILL — OXYCODONE HCL 5 MG PO TABS: 5 MG | ORAL | Qty: 1

## 2023-08-08 MED FILL — OXYCODONE HCL 5 MG PO TABS: 5 MG | ORAL | Qty: 2

## 2023-08-08 MED FILL — DULOXETINE HCL 30 MG PO CPEP: 30 MG | ORAL | Qty: 2

## 2023-08-08 NOTE — Progress Notes (Signed)
 Ortho / Neurosurgery Progress Note    POD# 3  s/p RIGHT TOTAL HIP REPLACEMENT, ANTERIOR APPROACH     "I feel sick today." Complaining up upset stomach. No diarrhea. No BM since surgery.     Visit Vitals  BP (!) 134/57   Pulse 90   Temp 98.1 F (36.7 C)

## 2023-08-08 NOTE — Plan of Care (Signed)
 Problem: Chronic Conditions and Co-morbidities  Goal: Patient's chronic conditions and co-morbidity symptoms are monitored and maintained or improved  Outcome: Progressing     Problem: Pain  Goal: Verbalizes/displays adequate comfort level or baseline co

## 2023-08-08 NOTE — Plan of Care (Signed)
 Problem: Physical Therapy - Adult  Goal: By Discharge: Performs mobility at highest level of function for planned discharge setting.  See evaluation for individualized goals.  Description: FUNCTIONAL STATUS PRIOR TO ADMISSION: Patient was modified indepe

## 2023-08-08 NOTE — Plan of Care (Signed)
 Problem: Pain  Goal: Verbalizes/displays adequate comfort level or baseline comfort level  08/08/2023 1009 by Perrin Eddleman, [place], RN  Outcome: Progressing  08/07/2023 2159 by Izola Price, RN  Outcome: Progressing     Problem: Safety - Adult  Goal: Fr

## 2023-08-08 NOTE — Progress Notes (Signed)
 End of Shift Note    Bedside shift change report given to [place], Charity fundraiser (Cabin crew) by Izola Price, RN (offgoing nurse).  Report included the following information SBAR, Kardex, OR Summary, Procedure Summary, Intake/Output, MAR, and Recent Results

## 2023-08-09 MED FILL — GABAPENTIN 300 MG PO CAPS: 300 MG | ORAL | Qty: 4

## 2023-08-09 MED FILL — OXYCODONE HCL 5 MG PO TABS: 5 MG | ORAL | Qty: 2

## 2023-08-09 MED FILL — STOOL SOFTENER/LAXATIVE 50-8.6 MG PO TABS: 50-8.6 MG | ORAL | Qty: 1

## 2023-08-09 MED FILL — ACETAMINOPHEN 500 MG PO TABS: 500 MG | ORAL | Qty: 2

## 2023-08-09 MED FILL — TOPIRAMATE 100 MG PO TABS: 100 MG | ORAL | Qty: 2

## 2023-08-09 MED FILL — HEALTHYLAX 17 G PO PACK: 17 g | ORAL | Qty: 1

## 2023-08-09 MED FILL — ASPIRIN LOW DOSE 81 MG PO TBEC: 81 MG | ORAL | Qty: 1

## 2023-08-09 MED FILL — BUSPIRONE HCL 10 MG PO TABS: 10 MG | ORAL | Qty: 3

## 2023-08-09 MED FILL — DULOXETINE HCL 30 MG PO CPEP: 30 MG | ORAL | Qty: 2

## 2023-08-09 MED FILL — FAMOTIDINE 20 MG PO TABS: 20 MG | ORAL | Qty: 1

## 2023-08-09 MED FILL — FUROSEMIDE 40 MG PO TABS: 40 MG | ORAL | Qty: 1

## 2023-08-09 NOTE — Progress Notes (Signed)
 End of Shift Note    Bedside shift change report given to Virpal RN (oncoming nurse) by Hinda Lenis, RN (offgoing nurse).  Report included the following information SBAR, Kardex, Intake/Output, MAR, and Recent Results    Shift worked:  day     Shift s

## 2023-08-09 NOTE — Plan of Care (Signed)
 Problem: Physical Therapy - Adult  Goal: By Discharge: Performs mobility at highest level of function for planned discharge setting.  See evaluation for individualized goals.  Description: FUNCTIONAL STATUS PRIOR TO ADMISSION: Patient was modified indepe

## 2023-08-09 NOTE — Progress Notes (Addendum)
 Ortho / Neurosurgery Progress Note    POD# 4  s/p RIGHT TOTAL HIP REPLACEMENT, ANTERIOR APPROACH     Feeling better today. Has eaten and had BM.     Patient in bed.     Visit Vitals  BP 137/65   Pulse 92   Temp 98.1 F (36.7 C) (Oral)   Resp 16   Ht 1.676

## 2023-08-09 NOTE — Plan of Care (Signed)
 Problem: Chronic Conditions and Co-morbidities  Goal: Patient's chronic conditions and co-morbidity symptoms are monitored and maintained or improved  08/09/2023 0758 by Hinda Lenis, RN  Outcome: Progressing  08/08/2023 2118 by Estella Husk, RN

## 2023-08-09 NOTE — Plan of Care (Signed)
 Problem: Chronic Conditions and Co-morbidities  Goal: Patient's chronic conditions and co-morbidity symptoms are monitored and maintained or improved  Outcome: Progressing     Problem: Pain  Goal: Verbalizes/displays adequate comfort level or baseline co

## 2023-08-10 LAB — BASIC METABOLIC PANEL
Anion Gap: 3 mmol/L (ref 2–12)
BUN/Creatinine Ratio: 16 (ref 12–20)
BUN: 13 mg/dL (ref 6–20)
CO2: 34 mmol/L — ABNORMAL HIGH (ref 21–32)
Calcium: 9 mg/dL (ref 8.5–10.1)
Chloride: 100 mmol/L (ref 97–108)
Creatinine: 0.82 mg/dL (ref 0.55–1.02)
Est, Glom Filt Rate: 77 mL/min/{1.73_m2} (ref >60)
Glucose: 131 mg/dL — ABNORMAL HIGH (ref 65–100)
Potassium: 3.3 mmol/L — ABNORMAL LOW (ref 3.5–5.1)
Sodium: 137 mmol/L (ref 136–145)

## 2023-08-10 MED ORDER — POTASSIUM CHLORIDE CRYS ER 20 MEQ PO TBCR
20 | Freq: Once | ORAL | Status: AC
Start: 2023-08-10 — End: 2023-08-10
  Administered 2023-08-10: 19:00:00 40 meq via ORAL

## 2023-08-10 MED ORDER — LINACLOTIDE 145 MCG PO CAPS
145 | Freq: Every day | ORAL | Status: DC
Start: 2023-08-10 — End: 2023-08-10
  Administered 2023-08-10: 19:00:00 145 ug via ORAL

## 2023-08-10 MED ORDER — GABAPENTIN 400 MG PO CAPS
400 | ORAL_CAPSULE | Freq: Every evening | ORAL | 0 refills | Status: AC
Start: 2023-08-10 — End: 2023-08-24

## 2023-08-10 MED ORDER — ACETAMINOPHEN 500 MG PO TABS
500 | ORAL_TABLET | Freq: Three times a day (TID) | ORAL | 3 refills | Status: AC
Start: 2023-08-10 — End: ?

## 2023-08-10 MED ORDER — OXYCODONE HCL 5 MG PO TABS
5 | ORAL_TABLET | Freq: Four times a day (QID) | ORAL | 0 refills | Status: AC | PRN
Start: 2023-08-10 — End: 2023-08-17

## 2023-08-10 MED ORDER — ASPIRIN 81 MG PO TBEC
81 | ORAL_TABLET | Freq: Two times a day (BID) | ORAL | 0 refills | Status: AC
Start: 2023-08-10 — End: 2023-09-09

## 2023-08-10 MED FILL — ASPIRIN LOW DOSE 81 MG PO TBEC: 81 MG | ORAL | Qty: 1

## 2023-08-10 MED FILL — OXYCODONE HCL 5 MG PO TABS: 5 MG | ORAL | Qty: 2

## 2023-08-10 MED FILL — ACETAMINOPHEN 500 MG PO TABS: 500 MG | ORAL | Qty: 2

## 2023-08-10 MED FILL — TOPIRAMATE 100 MG PO TABS: 100 MG | ORAL | Qty: 2

## 2023-08-10 MED FILL — BUSPIRONE HCL 10 MG PO TABS: 10 MG | ORAL | Qty: 3

## 2023-08-10 MED FILL — FAMOTIDINE 20 MG PO TABS: 20 MG | ORAL | Qty: 1

## 2023-08-10 MED FILL — LINZESS 145 MCG PO CAPS: 145 MCG | ORAL | Qty: 1

## 2023-08-10 MED FILL — FUROSEMIDE 40 MG PO TABS: 40 MG | ORAL | Qty: 1

## 2023-08-10 MED FILL — STOOL SOFTENER/LAXATIVE 50-8.6 MG PO TABS: 50-8.6 MG | ORAL | Qty: 1

## 2023-08-10 MED FILL — DULOXETINE HCL 30 MG PO CPEP: 30 MG | ORAL | Qty: 2

## 2023-08-10 MED FILL — POTASSIUM CHLORIDE CRYS ER 20 MEQ PO TBCR: 20 MEQ | ORAL | Qty: 2

## 2023-08-10 MED FILL — HEALTHYLAX 17 G PO PACK: 17 g | ORAL | Qty: 1

## 2023-08-10 MED FILL — GABAPENTIN 300 MG PO CAPS: 300 MG | ORAL | Qty: 4

## 2023-08-10 NOTE — Progress Notes (Signed)
 Occupational Therapy  Medical record reviewed.  Upon arrival, PT working with pt.  She is returning to bed and reporting discomfort at this time. (Nauseated and constipated).   Will follow up as appropriate.Marland Kitchen

## 2023-08-10 NOTE — Discharge Summary (Signed)
 Ortho Discharge Summary    Patient ID:  Katherine [name]  [id number]  female  66 y.o.  Oct 20, 1955    Admit date: 08/05/2023    Discharge date: 08/10/2023    Admitting Physician: Roxan Diesel, MD     Consulting Physician(s):   Treatment Team:   Beryl Meager

## 2023-08-10 NOTE — Progress Notes (Signed)
 Interdisciplinary Rounds      Plan of care discussed at Interdisciplinary Rounds attended by Ortho NP, Ortho Director, CM and PT. Patient has been accepted to [facility]. CM t

## 2023-08-10 NOTE — Plan of Care (Signed)
 Problem: Chronic Conditions and Co-morbidities  Goal: Patient's chronic conditions and co-morbidity symptoms are monitored and maintained or improved  08/10/2023 0805 by Arcelia Jew, RN  Outcome: Progressing  08/09/2023 2206 by Charlette Caffey, R

## 2023-08-10 NOTE — Plan of Care (Signed)
 Problem: Physical Therapy - Adult  Goal: By Discharge: Performs mobility at highest level of function for planned discharge setting.  See evaluation for individualized goals.  Description: FUNCTIONAL STATUS PRIOR TO ADMISSION: Patient was modified indepe

## 2023-08-10 NOTE — Progress Notes (Signed)
 End of Shift Note    Bedside shift change report given to Alica,RN (oncoming nurse) by Charlette Caffey, RN (offgoing nurse).  Report included the following information SBAR, Kardex, Intake/Output, MAR, and Recent Results    Shift worked:  7pm-7am     Shift

## 2023-08-10 NOTE — Progress Notes (Signed)
 DISCHARGE NOTE FROM [facility]    Patient determined to be stable for discharge by attending provider. I have reviewed the discharge instructions with the patient. They verbalized understanding and all questions were answered to their satisfaction. No com

## 2023-08-10 NOTE — Care Coordination-Inpatient (Addendum)
 CM needs completed     TOC Plan: SNF - [facility] (bed ready at/after 3 PM)    Report: [id number]  Room: 9344  Transportation: [facility] requested for 3:30 PM  IMM letter: 2nd IMM signed 08/10/23; copy on chart    Update - 1:18 PM: CM met with pt a

## 2023-08-10 NOTE — Progress Notes (Signed)
 Ortho / Neurosurgery NP Note    POD# 5  s/p RIGHT TOTAL HIP REPLACEMENT, ANTERIOR APPROACH   Pt seen with no visitor     Patient in bed  Reports expected post op pain w pain radiating down right leg.   Hx of chronic back pain, fibromyalgia. Takes Norco 4x/

## 2023-09-24 NOTE — Progress Notes (Signed)
 This encounter was created in error - please disregard.

## 2023-10-05 ENCOUNTER — Encounter

## 2024-06-08 ENCOUNTER — Encounter

## 2024-07-19 ENCOUNTER — Inpatient Hospital Stay: Admit: 2024-07-19 | Payer: MEDICARE | Primary: Family Medicine

## 2024-07-19 DIAGNOSIS — Z1231 Encounter for screening mammogram for malignant neoplasm of breast: Principal | ICD-10-CM

## 2024-08-30 ENCOUNTER — Encounter

## 2024-09-20 ENCOUNTER — Ambulatory Visit: Payer: MEDICARE | Primary: Family Medicine

## 2024-09-26 ENCOUNTER — Inpatient Hospital Stay: Admit: 2024-09-26 | Payer: MEDICARE | Attending: Physical Medicine & Rehabilitation | Primary: Family Medicine

## 2024-09-26 DIAGNOSIS — M5416 Radiculopathy, lumbar region: Principal | ICD-10-CM

## 2024-09-28 ENCOUNTER — Inpatient Hospital Stay: Admit: 2024-09-28 | Payer: MEDICARE | Attending: Physical Medicine & Rehabilitation | Primary: Family Medicine

## 2024-09-28 DIAGNOSIS — M5412 Radiculopathy, cervical region: Principal | ICD-10-CM
